# Patient Record
Sex: Male | Born: 1955
Health system: Southern US, Community
[De-identification: ages and names within clinical notes are randomized; demographics above are authoritative.]

## PROBLEM LIST (undated history)

## (undated) DIAGNOSIS — F32A Depression, unspecified: Secondary | ICD-10-CM

## (undated) DIAGNOSIS — F329 Major depressive disorder, single episode, unspecified: Secondary | ICD-10-CM

## (undated) DIAGNOSIS — I214 Non-ST elevation (NSTEMI) myocardial infarction: Secondary | ICD-10-CM

## (undated) DIAGNOSIS — I2510 Atherosclerotic heart disease of native coronary artery without angina pectoris: Secondary | ICD-10-CM

## (undated) DIAGNOSIS — I10 Essential (primary) hypertension: Secondary | ICD-10-CM

## (undated) HISTORY — DX: Major depressive disorder, single episode, unspecified: F32.9

## (undated) HISTORY — DX: Depression, unspecified: F32.A

## (undated) HISTORY — PX: CARDIAC CATHETERIZATION: SHX172

## (undated) HISTORY — PX: BACK SURGERY: SHX140

## (undated) HISTORY — PX: KNEE ARTHROSCOPY: SUR90

## (undated) HISTORY — PX: TONSILLECTOMY: SUR1361

## (undated) HISTORY — DX: Non-ST elevation (NSTEMI) myocardial infarction: I21.4

---
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
## 2000-06-03 ENCOUNTER — Ambulatory Visit (HOSPITAL_BASED_OUTPATIENT_CLINIC_OR_DEPARTMENT_OTHER): Admission: RE | Admit: 2000-06-03 | Discharge: 2000-06-03 | Payer: Self-pay | Admitting: Orthopedic Surgery

## 2006-10-22 ENCOUNTER — Ambulatory Visit: Payer: Self-pay

## 2011-09-18 ENCOUNTER — Ambulatory Visit (INDEPENDENT_AMBULATORY_CARE_PROVIDER_SITE_OTHER): Payer: 59

## 2011-09-18 DIAGNOSIS — G4700 Insomnia, unspecified: Secondary | ICD-10-CM

## 2011-09-26 ENCOUNTER — Encounter (INDEPENDENT_AMBULATORY_CARE_PROVIDER_SITE_OTHER): Payer: 59 | Admitting: Internal Medicine

## 2011-09-26 DIAGNOSIS — Z0000 Encounter for general adult medical examination without abnormal findings: Secondary | ICD-10-CM

## 2011-09-26 DIAGNOSIS — Z23 Encounter for immunization: Secondary | ICD-10-CM

## 2011-11-20 ENCOUNTER — Ambulatory Visit (INDEPENDENT_AMBULATORY_CARE_PROVIDER_SITE_OTHER): Payer: 59 | Admitting: Family Medicine

## 2011-11-20 VITALS — BP 156/75 | HR 79 | Temp 97.7°F | Resp 16 | Ht 70.0 in | Wt 221.0 lb

## 2011-11-20 DIAGNOSIS — R6889 Other general symptoms and signs: Secondary | ICD-10-CM

## 2011-11-20 DIAGNOSIS — R059 Cough, unspecified: Secondary | ICD-10-CM

## 2011-11-20 DIAGNOSIS — R05 Cough: Secondary | ICD-10-CM

## 2011-11-20 DIAGNOSIS — J111 Influenza due to unidentified influenza virus with other respiratory manifestations: Secondary | ICD-10-CM

## 2011-11-20 LAB — POCT INFLUENZA A/B
Influenza A, POC: NEGATIVE
Influenza B, POC: NEGATIVE

## 2011-11-20 MED ORDER — AZITHROMYCIN 250 MG PO TABS: ORAL_TABLET | ORAL | Status: AC

## 2011-11-20 MED ORDER — HYDROCODONE-HOMATROPINE 5-1.5 MG/5ML PO SYRP: 5.0000 mL | ORAL_SOLUTION | Freq: Three times a day (TID) | ORAL | Status: AC | PRN

## 2011-11-20 MED ORDER — BENZONATATE 200 MG PO CAPS: 200.0000 mg | ORAL_CAPSULE | Freq: Two times a day (BID) | ORAL | Status: AC | PRN

## 2011-11-20 MED ORDER — FLUTICASONE PROPIONATE 50 MCG/ACT NA SUSP: 2.0000 | Freq: Every day | NASAL | Status: AC

## 2011-11-20 NOTE — Progress Notes (Signed)
[facility]:  Office Visit  Chief Complaint:  Chief Complaint  Patient presents with  . Sore Throat    last friday  . Cough  . Chills    HPI: Shane Kerr is a 56 y.o. male who complains of  3 day h/o chills, cough (clear), sinus congestion, msk aches. No other complaints. Tried Mucinex, dayquil, nyquil.   Past Medical History  Diagnosis Date  . Depression    History reviewed. No pertinent past surgical history. History   Social History  . Marital Status: Married    Spouse Name: N/A    Number of Children: N/A  . Years of Education: N/A   Social History Main Topics  . Smoking status: Never Smoker   . Smokeless tobacco: None  . Alcohol Use: No  . Drug Use: No  . Sexually Active: None   Other Topics Concern  . None   Social History Narrative  . None   No family history on file. Allergies  Allergen Reactions  . Sulfa Antibiotics Nausea And Vomiting   Prior to Admission medications   Medication Sig Start Date End Date Taking? Authorizing Provider  sertraline (ZOLOFT) 100 MG tablet Take 100 mg by mouth daily.   Yes Historical Provider, MD     ROS: The patient denies fevers, night sweats, unintentional weight loss, chest pain, palpitations, wheezing, dyspnea on exertion, nausea, vomiting, abdominal pain, dysuria, hematuria, melena, numbness, weakness, or tingling. + chills, cough, msk aches  All other systems have been reviewed and were otherwise negative with the exception of those mentioned in the HPI and as above.    PHYSICAL EXAM: Filed Vitals:   11/20/11 1010  BP: 156/75  Pulse: 79  Temp: 97.7 F (36.5 C)  Resp: 16   Filed Vitals:   11/20/11 1010  Height: 5\' 10"  (1.778 m)  Weight: 221 lb (100.245 kg)   Body mass index is 31.71 kg/(m^2).  General: Alert, no acute distress HEENT:  Normocephalic, atraumatic, oropharynx patent. TM nl, + sinus tenderness max bilaterally, NO exudates, boggy erythematous  nares Cardiovascular:  Regular rate and rhythm, no rubs murmurs or gallops.  No Carotid bruits, radial pulse intact. No pedal edema.  Respiratory: Clear to auscultation bilaterally.  No wheezes, rales, or rhonchi.  No cyanosis, no use of accessory musculature GI: No organomegaly, abdomen is soft and non-tender, positive bowel sounds.  No masses. Skin: No rashes. Neurologic: Facial musculature symmetric. Psychiatric: Patient is appropriate throughout our interaction. Lymphatic: No cervical lymphadenopathy Musculoskeletal: Gait intact.   LABS: Results for orders placed in visit on 11/20/11  POCT INFLUENZA A/B      Component Value Range   Influenza A, POC Negative     Influenza B, POC Negative       EKG/XRAY:   Primary read interpreted by Dr. Conley Rolls at [facility].   ASSESSMENT/PLAN: Encounter Diagnoses  Name Primary?  . Cough Yes  . Flu-like symptoms    Sinusitis Rx Tessalon Perles, Hydromet, use Z pack if sxs txt does not work in 4-5 days Rx Flonase     Reagann Dolce PHUONG, DO 11/20/2011 11:00 AM

## 2012-12-09 ENCOUNTER — Ambulatory Visit (INDEPENDENT_AMBULATORY_CARE_PROVIDER_SITE_OTHER): Payer: Managed Care, Other (non HMO) | Admitting: Internal Medicine

## 2012-12-09 ENCOUNTER — Other Ambulatory Visit: Payer: Self-pay | Admitting: Internal Medicine

## 2012-12-09 VITALS — BP 127/74 | HR 44 | Temp 98.2°F | Resp 18 | Ht 71.0 in | Wt 221.0 lb

## 2012-12-09 DIAGNOSIS — Z13220 Encounter for screening for lipoid disorders: Secondary | ICD-10-CM

## 2012-12-09 DIAGNOSIS — R3915 Urgency of urination: Secondary | ICD-10-CM

## 2012-12-09 DIAGNOSIS — Z125 Encounter for screening for malignant neoplasm of prostate: Secondary | ICD-10-CM

## 2012-12-09 DIAGNOSIS — Z1211 Encounter for screening for malignant neoplasm of colon: Secondary | ICD-10-CM

## 2012-12-09 LAB — POCT UA - MICROSCOPIC ONLY
Bacteria, U Microscopic: NEGATIVE
Casts, Ur, LPF, POC: NEGATIVE
Crystals, Ur, HPF, POC: NEGATIVE
WBC, Ur, HPF, POC: NEGATIVE
Yeast, UA: NEGATIVE

## 2012-12-09 LAB — POCT CBC
Granulocyte percent: 61.8 %G (ref 37–80)
HCT, POC: 50.7 % (ref 43.5–53.7)
Hemoglobin: 16.6 g/dL (ref 14.1–18.1)
Lymph, poc: 3.3 (ref 0.6–3.4)
MCH, POC: 30.9 pg (ref 27–31.2)
MCHC: 32.7 g/dL (ref 31.8–35.4)
MCV: 94.2 fL (ref 80–97)
MID (cbc): 0.7 (ref 0–0.9)
MPV: 8.2 fL (ref 0–99.8)
POC Granulocyte: 6.4 (ref 2–6.9)
POC LYMPH PERCENT: 31.3 %L (ref 10–50)
POC MID %: 6.9 %M (ref 0–12)
Platelet Count, POC: 264 10*3/uL (ref 142–424)
RBC: 5.38 M/uL (ref 4.69–6.13)
RDW, POC: 14.2 %
WBC: 10.4 10*3/uL — AB (ref 4.6–10.2)

## 2012-12-09 LAB — POCT URINALYSIS DIPSTICK
Bilirubin, UA: NEGATIVE
Blood, UA: NEGATIVE
Glucose, UA: NEGATIVE
Leukocytes, UA: NEGATIVE
Nitrite, UA: NEGATIVE
Protein, UA: NEGATIVE
Spec Grav, UA: 1.025
Urobilinogen, UA: 0.2
pH, UA: 7

## 2012-12-09 LAB — IFOBT (OCCULT BLOOD): IFOBT: NEGATIVE

## 2012-12-09 NOTE — Progress Notes (Signed)
Subjective:    Patient ID: Shane Kerr, male    DOB: [birth year], 57 y.o.   MRN: [id number]  HPIurinary urgency for 1-2 months No dysuria, frequency, nocturia, dribbling, hesitation, early cutoff One episode of nocturnal incontinence Father hx prostate Ca Stopped Zoloft-"sleep" better//much better overall especially with New job city of [employer]  Reviewed old paper chart//no routine labs for a few years  Review of Systems No fever chills night sweats   no weight loss No abdominal pain No back pain Objective:   Physical Exam BP 127/74  Pulse 44  Temp(Src) 98.2 F (36.8 C) (Oral)  Resp 18  Ht 5\' 11"  (1.803 m)  Wt 221 lb (100.245 kg)  BMI 30.84 kg/m2  SpO2 96% HEENT clear Heart regular without murmur Lungs clear Abdomen supple Prostate soft and symmetrical and not enlarged Extremities clear       Assessment & Plan:   Problem List Items Addressed This Visit   None    Visit Diagnoses   Urinary urgency    -  Primary    Relevant Orders       Comprehensive metabolic panel       PSA       TSH    Screening for hyperlipidemia        Relevant Orders       Lipid panel    Special screening for malignant neoplasms, colon        Screening for prostate cancer          Results for orders placed in visit on 12/09/12  COMPREHENSIVE METABOLIC PANEL      Result Value Range   Sodium 138  135 - 145 mEq/L   Potassium 4.4  3.5 - 5.3 mEq/L   Chloride 103  96 - 112 mEq/L   CO2 27  19 - 32 mEq/L   Glucose, Bld 86  70 - 99 mg/dL   BUN 18  6 - 23 mg/dL   Creat 4.09  8.11 - 9.14 mg/dL   Total Bilirubin 0.5  0.3 - 1.2 mg/dL   Alkaline Phosphatase 50  39 - 117 U/L   AST 19  0 - 37 U/L   ALT 20  0 - 53 U/L   Total Protein 7.3  6.0 - 8.3 g/dL   Albumin 4.7  3.5 - 5.2 g/dL   Calcium 9.7  8.4 - 78.2 mg/dL  LIPID PANEL      Result Value Range   Cholesterol 153  0 - 200 mg/dL   Triglycerides 73  <956 mg/dL   HDL 51  >21 mg/dL   Total CHOL/HDL Ratio 3.0     VLDL 15  0 -  40 mg/dL   LDL Cholesterol 87  0 - 99 mg/dL  PSA      Result Value Range   PSA 1.26  <=4.00 ng/mL  TSH      Result Value Range   TSH 4.939 (*) 0.350 - 4.500 uIU/mL  POCT URINALYSIS DIPSTICK      Result Value Range   Color, UA yellow     Clarity, UA hazy     Glucose, UA neg     Bilirubin, UA neg     Ketones, UA trace     Spec Grav, UA 1.025     Blood, UA neg     pH, UA 7.0     Protein, UA neg     Urobilinogen, UA 0.2     Nitrite, UA neg  Leukocytes, UA Negative    POCT UA - MICROSCOPIC ONLY      Result Value Range   WBC, Ur, HPF, POC neg     RBC, urine, microscopic 0-2     Bacteria, U Microscopic neg     Mucus, UA small     Epithelial cells, urine per micros 0-1     Crystals, Ur, HPF, POC neg     Casts, Ur, LPF, POC neg     Yeast, UA neg     Amorphous moderate    POCT CBC      Result Value Range   WBC 10.4 (*) 4.6 - 10.2 K/uL   Lymph, poc 3.3  0.6 - 3.4   POC LYMPH PERCENT 31.3  10 - 50 %L   MID (cbc) 0.7  0 - 0.9   POC MID % 6.9  0 - 12 %M   POC Granulocyte 6.4  2 - 6.9   Granulocyte percent 61.8  37 - 80 %G   RBC 5.38  4.69 - 6.13 M/uL   Hemoglobin 16.6  14.1 - 18.1 g/dL   HCT, POC 16.1  09.6 - 53.7 %   MCV 94.2  80 - 97 fL   MCH, POC 30.9  27 - 31.2 pg   MCHC 32.7  31.8 - 35.4 g/dL   RDW, POC 04.5     Platelet Count, POC 264  142 - 424 K/uL   MPV 8.2  0 - 99.8 fL  IFOBT (OCCULT BLOOD)      Result Value Range   IFOBT Negative     Will add free t4 ?urol consult

## 2012-12-10 LAB — LIPID PANEL
Cholesterol: 153 mg/dL (ref 0–200)
HDL: 51 mg/dL (ref >39)
LDL Cholesterol: 87 mg/dL (ref 0–99)
Total CHOL/HDL Ratio: 3 Ratio
Triglycerides: 73 mg/dL (ref <150)
VLDL: 15 mg/dL (ref 0–40)

## 2012-12-10 LAB — COMPREHENSIVE METABOLIC PANEL
ALT: 20 U/L (ref 0–53)
AST: 19 U/L (ref 0–37)
Albumin: 4.7 g/dL (ref 3.5–5.2)
Alkaline Phosphatase: 50 U/L (ref 39–117)
BUN: 18 mg/dL (ref 6–23)
CO2: 27 mEq/L (ref 19–32)
Calcium: 9.7 mg/dL (ref 8.4–10.5)
Chloride: 103 mEq/L (ref 96–112)
Creat: 1.23 mg/dL (ref 0.50–1.35)
Glucose, Bld: 86 mg/dL (ref 70–99)
Potassium: 4.4 mEq/L (ref 3.5–5.3)
Sodium: 138 mEq/L (ref 135–145)
Total Bilirubin: 0.5 mg/dL (ref 0.3–1.2)
Total Protein: 7.3 g/dL (ref 6.0–8.3)

## 2012-12-10 LAB — PSA: PSA: 1.26 ng/mL (ref <=4.00)

## 2012-12-10 LAB — TSH: TSH: 4.939 u[IU]/mL — ABNORMAL HIGH (ref 0.350–4.500)

## 2012-12-11 LAB — T4, FREE: Free T4: 1.2 ng/dL (ref 0.80–1.80)

## 2012-12-15 ENCOUNTER — Encounter: Payer: Self-pay | Admitting: Internal Medicine

## 2012-12-17 ENCOUNTER — Telehealth: Payer: Self-pay

## 2012-12-17 NOTE — Telephone Encounter (Signed)
Labs mailed to patient. They look great. Patient advised.

## 2012-12-17 NOTE — Telephone Encounter (Signed)
Pt is calling about his lab results if someone could give him a call back at [phone]

## 2013-05-11 ENCOUNTER — Ambulatory Visit (INDEPENDENT_AMBULATORY_CARE_PROVIDER_SITE_OTHER): Payer: Managed Care, Other (non HMO) | Admitting: Internal Medicine

## 2013-05-11 VITALS — BP 120/78 | HR 76 | Temp 97.9°F | Resp 18 | Ht 70.0 in | Wt 221.2 lb

## 2013-05-11 DIAGNOSIS — G4700 Insomnia, unspecified: Secondary | ICD-10-CM

## 2013-05-11 DIAGNOSIS — F4323 Adjustment disorder with mixed anxiety and depressed mood: Secondary | ICD-10-CM

## 2013-05-11 MED ORDER — SERTRALINE HCL 100 MG PO TABS: ORAL_TABLET | ORAL | Status: DC

## 2013-05-12 NOTE — Progress Notes (Signed)
  Subjective:    Patient ID: Shane Kerr, male    DOB: 1956-08-01, 57 y.o.   MRN: [id number]  HPIsee long hx Weaned zoloft and did well til job change with new supervisor Now stressed with anxiety and some dysthymia occas sleep issues if worrying May need to chnge again  Review of Systems     Objective:   Physical Exam BP 120/78  Pulse 76  Temp(Src) 97.9 F (36.6 C) (Oral)  Resp 18  Ht 5\' 10"  (1.778 m)  Wt 221 lb 3.2 oz (100.336 kg)  BMI 31.74 kg/m2  SpO2 98% Mood stable/thoughts good       Assessment & Plan:  Reactive mood changes with D/A/I  Restart Zoloft 50---? Advance to 100 if no response He has xanax from 2 yrs ago--very occas prn-call if needs more 3 mos-41mos  Meds ordered this encounter  Medications  . sertraline (ZOLOFT) 100 MG tablet    Sig: 1/2 to 1 tab as directed    Dispense:  30 tablet    Refill:  3

## 2013-09-09 ENCOUNTER — Encounter: Payer: Self-pay | Admitting: Podiatry

## 2013-09-09 ENCOUNTER — Ambulatory Visit (INDEPENDENT_AMBULATORY_CARE_PROVIDER_SITE_OTHER): Payer: Managed Care, Other (non HMO) | Admitting: Podiatry

## 2013-09-09 VITALS — BP 129/73 | HR 77 | Resp 12 | Ht 71.0 in | Wt 225.0 lb

## 2013-09-09 DIAGNOSIS — L03039 Cellulitis of unspecified toe: Secondary | ICD-10-CM

## 2013-09-09 NOTE — Progress Notes (Signed)
   Subjective:    Patient ID: Shane Kerr, male    DOB: 1956-06-18, 58 y.o.   MRN: [id number]  HPI Comments: '' LT FOOT 3RD TOENAIL START BLEEDING AND SORE THIS MORNING. ALSO, THE REST OF THE TOENAILS HAVE DISCOLORATION AND  TRIED NO TREATMENT.''  He thinks that he may have injured the nail last night. And also has some concern about the multiple deformed toenails x10 and right and left feet. He recalls some use of oral medication in the past with some improvement the    Review of Systems  All other systems reviewed and are negative.       Objective:   Physical Exam : Subjective: Orientated x153 58 year old white male  Vascular: The DP and PT pulses are two over four bilaterally.  Neurological: Deferred  Dermatological: 10 yellow, brittle, deformed toenails noted. The third left toenail is partially attached at the posterior nail fold it has a small amount of bleeding and is very tender to palpation.  Musculoskeletal no deformities noted bilaterally       Assessment & Plan:   Assessment: Partial traumatic nail avulsion third toe left foot. Paronychia third left toe secondary to trauma. Onychomycoses x10  Plan: Offered patient and avulsion of the third left toenail which he accepted. We also had a discussion about permanent nail removal to the third left toenail which he declined.  The third left toe was blocked with 2 cc of 50-50 mixture of 2% plain Xylocaine and 0.5% plain Marcaine. The third left toe was prepped with Betadine and exsanguinated. The third left nail was avulsed demonstrating approximately 90% detachment from the underlying nailbed. An antibiotic dressing was applied. The tourniquet was released and spontaneous Fill was noted to the third digit on the left foot. Patient will use over-the-counter NSAID if needed for pain control and soaked in dilute it antibacterial soft soaks.  We had a discussion about treatment options for nail fungal infections  including no treatment topical oral medication or permanent nail removal. Patient will consider treatment options same contacts he wishes any followup care for onychomycoses.

## 2013-09-09 NOTE — Patient Instructions (Signed)
ANTIBACTERIAL SOAP INSTRUCTIONS  THE DAY AFTER PROCEDURE  Please follow the instructions your doctor has marked.   Shower as usual. Before getting out, place a drop of antibacterial liquid soap (Dial) on a wet, clean washcloth.  Gently wipe washcloth over affected area.  Afterward, rinse the area with warm water.  Blot the area dry with a soft cloth and cover with antibiotic ointment (neosporin, polysporin, bacitracin) and band aid or gauze and tape  Place 3-4 drops of antibacterial liquid soap in a quart of warm tap water.  Submerge foot into water for 20 minutes.  If bandage was applied after your procedure, leave on to allow for easy lift off, then remove and continue with soak for the remaining time.  Next, blot area dry with a soft cloth and cover with a bandage.  Apply other medications as directed by your doctor, such as cortisporin otic solution (eardrops) or neosporin antibiotic ointment  For mild pain use over-the-counter medication as ibuprofen or Aleve.

## 2014-01-18 ENCOUNTER — Other Ambulatory Visit: Payer: Self-pay | Admitting: Internal Medicine

## 2014-01-21 ENCOUNTER — Other Ambulatory Visit: Payer: Self-pay | Admitting: Internal Medicine

## 2014-03-02 ENCOUNTER — Other Ambulatory Visit: Payer: Self-pay | Admitting: Internal Medicine

## 2014-05-02 ENCOUNTER — Ambulatory Visit (INDEPENDENT_AMBULATORY_CARE_PROVIDER_SITE_OTHER): Payer: Managed Care, Other (non HMO) | Admitting: Internal Medicine

## 2014-05-02 VITALS — BP 108/62 | HR 80 | Temp 98.0°F | Resp 15 | Ht 71.0 in | Wt 222.0 lb

## 2014-05-02 DIAGNOSIS — G4700 Insomnia, unspecified: Secondary | ICD-10-CM

## 2014-05-02 MED ORDER — SERTRALINE HCL 100 MG PO TABS: ORAL_TABLET | ORAL | Status: DC

## 2014-05-02 NOTE — Progress Notes (Signed)
F/u Reactive depr-workplace stress Still in very stressful enviro Trying to wean medication over the past year because he has a concern that he is being refused long-term insurance because of the history that shows up in his medical records of being on this medicine He works in [employer] and is aware of all the availability of data that is less than secure  His health is stable otherwise Next labs and physical at age 58  Review of systems negative today Exam BP 108/62  Pulse 80  Temp(Src) 98 F (36.7 C) (Oral)  Resp 15  Ht  (1.803 m)  Wt 222 lb (100.699 kg)  BMI 30.98 kg/m2  SpO2 97% HEENT negative Heart regular Extremities clear Mood stable  Imp Reactive depression  Meds ordered this encounter  Medications  . sertraline (ZOLOFT) 100 MG tablet    Sig: Take 1/2 to 1 tablet by mouth daily.    Dispense:  90 tablet    Refill:  3   He will observe this symptom complex w/ vaiable Doses over 6-8 week period to make a decision about weaning We discussed the options of cognitive behavioral therapy F/u 2016

## 2014-05-17 ENCOUNTER — Ambulatory Visit (INDEPENDENT_AMBULATORY_CARE_PROVIDER_SITE_OTHER): Payer: Managed Care, Other (non HMO) | Admitting: Internal Medicine

## 2014-05-17 VITALS — BP 118/80 | HR 88 | Temp 97.8°F | Resp 16 | Ht 71.0 in | Wt 222.2 lb

## 2014-05-17 DIAGNOSIS — J0100 Acute maxillary sinusitis, unspecified: Secondary | ICD-10-CM

## 2014-05-17 MED ORDER — AMOXICILLIN 875 MG PO TABS
875.0000 mg | ORAL_TABLET | Freq: Two times a day (BID) | ORAL | Status: DC
Start: 2014-05-17 — End: 2014-08-12

## 2014-05-17 NOTE — Progress Notes (Signed)
   Subjective:   This chart was scribed for [name], MD by Arlan Organ, [facility] Scribe. This patient was seen in room 10 and the patient's care was started 4:16 PM.    Patient ID: Shane Kerr, male    DOB: 23-Jul-1956, 58 y.o.   MRN: [id number]  Chief Complaint  Patient presents with  . Sinus Problem    Possible sinus infection, nasal congestion, coughing, x1wk     HPI  HPI Comments: [name] is a 58 y.o. male with a PMHx of depression who presents to [facility] complaining of possible sinusitis x 1 week. Pt reports constant, moderate nasal congestion, sinus pressure, and mild cough. States symptoms are worsened in the morning after waking from sleep. Pt attributes symptoms to swimming in a cool pool over labor day weekend. No fever or chills.   There are no active problems to display for this patient.  Past Medical History  Diagnosis Date  . Depression    History reviewed. No pertinent past surgical history. Allergies  Allergen Reactions  . Sulfa Antibiotics Nausea And Vomiting   Prior to Admission medications   Medication Sig Start Date End Date Taking? Authorizing Provider  sertraline (ZOLOFT) 100 MG tablet Take 1/2 to 1 tablet by mouth daily. 05/02/14  Yes Tonye Pearson, MD    Review of Systems  Constitutional: Negative for fever and chills.  HENT: Positive for congestion and sinus pressure.   Respiratory: Positive for cough.     Triage Vitals: BP 118/80  Pulse 88  Temp(Src) 97.8 F (36.6 C) (Oral)  Resp 16  Ht  (1.803 m)  Wt 222 lb 3.2 oz (100.789 kg)  BMI 31.00 kg/m2  SpO2 99%   Objective:  Physical Exam  HENT:  Right Ear: Tympanic membrane normal.  Left Ear: Tympanic membrane normal.  Mouth/Throat: Oropharynx is clear and moist.  Nares have purulent discharge Tender maxillary areas  Eyes: Conjunctivae are normal.  Pulmonary/Chest: Breath sounds normal.  Lymphadenopathy:    He  has no cervical adenopathy.    Assessment & Plan:   I personally performed the services described in this documentation, which was scribed in my presence. The recorded information has been reviewed and is accurate.   Acute maxillary sinusitis, recurrence not specified  Meds ordered this encounter  Medications  . amoxicillin (AMOXIL) 875 MG tablet    Sig: Take 1 tablet (875 mg total) by mouth 2 (two) times daily.    Dispense:  20 tablet    Refill:  0   otc decon

## 2014-06-03 HISTORY — PX: TRIGGER FINGER RELEASE: SHX641

## 2014-08-12 ENCOUNTER — Ambulatory Visit (INDEPENDENT_AMBULATORY_CARE_PROVIDER_SITE_OTHER): Payer: Managed Care, Other (non HMO) | Admitting: Physician Assistant

## 2014-08-12 VITALS — BP 138/80 | HR 77 | Temp 98.4°F | Resp 18 | Ht 71.0 in | Wt 230.0 lb

## 2014-08-12 DIAGNOSIS — M62830 Muscle spasm of back: Secondary | ICD-10-CM

## 2014-08-12 MED ORDER — MELOXICAM 7.5 MG PO TABS: 7.5000 mg | ORAL_TABLET | Freq: Every day | ORAL | Status: DC

## 2014-08-12 MED ORDER — CYCLOBENZAPRINE HCL 10 MG PO TABS: 10.0000 mg | ORAL_TABLET | Freq: Three times a day (TID) | ORAL | Status: DC | PRN

## 2014-08-12 NOTE — Patient Instructions (Signed)
You have some spasm along your lumbar back muscles which I think is causing the low back and glute pain. Please take the flexeril every 8 hours as needed for the spasm. Please take the mobic once daily for the next 14 days for inflammation. Do not take any other NSAIDs (ibuprofen, aleve, etc) with this. Try to rest your back. Apply heat and ice, whichever feels better. Once your are feeling better try to do some light range of motion exercises and stretching exercises.   Low Back Strain with Rehab A strain is an injury in which a tendon or muscle is torn. The muscles and tendons of the lower back are vulnerable to strains. However, these muscles and tendons are very strong and require a great force to be injured. Strains are classified into three categories. Grade 1 strains cause pain, but the tendon is not lengthened. Grade 2 strains include a lengthened ligament, due to the ligament being stretched or partially ruptured. With grade 2 strains there is still function, although the function may be decreased. Grade 3 strains involve a complete tear of the tendon or muscle, and function is usually impaired. SYMPTOMS   Pain in the lower back.  Pain that affects one side more than the other.  Pain that gets worse with movement and may be felt in the hip, buttocks, or back of the thigh.  Muscle spasms of the muscles in the back.  Swelling along the muscles of the back.  Loss of strength of the back muscles.  Crackling sound (crepitation) when the muscles are touched. CAUSES  Lower back strains occur when a force is placed on the muscles or tendons that is greater than they can handle. Common causes of injury include:  Prolonged overuse of the muscle-tendon units in the lower back, usually from incorrect posture.  A single violent injury or force applied to the back. RISK INCREASES WITH:  Sports that involve twisting forces on the spine or a lot of bending at the waist (football, rugby,  weightlifting, bowling, golf, tennis, speed skating, racquetball, swimming, running, gymnastics, diving).  Poor strength and flexibility.  Failure to warm up properly before activity.  Family history of lower back pain or disk disorders.  Previous back injury or surgery (especially fusion).  Poor posture with lifting, especially heavy objects.  Prolonged sitting, especially with poor posture. PREVENTION   Learn and use proper posture when sitting or lifting (maintain proper posture when sitting, lift using the knees and legs, not at the waist).  Warm up and stretch properly before activity.  Allow for adequate recovery between workouts.  Maintain physical fitness:  Strength, flexibility, and endurance.  Cardiovascular fitness. PROGNOSIS  If treated properly, lower back strains usually heal within 6 weeks. RELATED COMPLICATIONS   Recurring symptoms, resulting in a chronic problem.  Chronic inflammation, scarring, and partial muscle-tendon tear.  Delayed healing or resolution of symptoms.  Prolonged disability. TREATMENT  Treatment first involves the use of ice and medicine, to reduce pain and inflammation. The use of strengthening and stretching exercises may help reduce pain with activity. These exercises may be performed at home or with a therapist. Severe injuries may require referral to a therapist for further evaluation and treatment, such as ultrasound. Your caregiver may advise that you wear a back brace or corset, to help reduce pain and discomfort. Often, prolonged bed rest results in greater harm then benefit. Corticosteroid injections may be recommended. However, these should be reserved for the most serious cases. It is  important to avoid using your back when lifting objects. At night, sleep on your back on a firm mattress with a pillow placed under your knees. If non-surgical treatment is unsuccessful, surgery may be needed.  MEDICATION   If pain medicine is  needed, nonsteroidal anti-inflammatory medicines (aspirin and ibuprofen), or other minor pain relievers (acetaminophen), are often advised.  Do not take pain medicine for 7 days before surgery.  Prescription pain relievers may be given, if your caregiver thinks they are needed. Use only as directed and only as much as you need.  Ointments applied to the skin may be helpful.  Corticosteroid injections may be given by your caregiver. These injections should be reserved for the most serious cases, because they may only be given a certain number of times. HEAT AND COLD  Cold treatment (icing) should be applied for 10 to 15 minutes every 2 to 3 hours for inflammation and pain, and immediately after activity that aggravates your symptoms. Use ice packs or an ice massage.  Heat treatment may be used before performing stretching and strengthening activities prescribed by your caregiver, physical therapist, or athletic trainer. Use a heat pack or a warm water soak. SEEK MEDICAL CARE IF:   Symptoms get worse or do not improve in 2 to 4 weeks, despite treatment.  You develop numbness, weakness, or loss of bowel or bladder function.  New, unexplained symptoms develop. (Drugs used in treatment may produce side effects.) EXERCISES  RANGE OF MOTION (ROM) AND STRETCHING EXERCISES - Low Back Strain Most people with lower back pain will find that their symptoms get worse with excessive bending forward (flexion) or arching at the lower back (extension). The exercises which will help resolve your symptoms will focus on the opposite motion.  Your physician, physical therapist or athletic trainer will help you determine which exercises will be most helpful to resolve your lower back pain. Do not complete any exercises without first consulting with your caregiver. Discontinue any exercises which make your symptoms worse until you speak to your caregiver.  If you have pain, numbness or tingling which travels down  into your buttocks, leg or foot, the goal of the therapy is for these symptoms to move closer to your back and eventually resolve. Sometimes, these leg symptoms will get better, but your lower back pain may worsen. This is typically an indication of progress in your rehabilitation. Be very alert to any changes in your symptoms and the activities in which you participated in the 24 hours prior to the change. Sharing this information with your caregiver will allow him/her to most efficiently treat your condition.  These exercises may help you when beginning to rehabilitate your injury. Your symptoms may resolve with or without further involvement from your physician, physical therapist or athletic trainer. While completing these exercises, remember:  Restoring tissue flexibility helps normal motion to return to the joints. This allows healthier, less painful movement and activity.  An effective stretch should be held for at least 30 seconds.  A stretch should never be painful. You should only feel a gentle lengthening or release in the stretched tissue. FLEXION RANGE OF MOTION AND STRETCHING EXERCISES: STRETCH - Flexion, Single Knee to Chest   Lie on a firm bed or floor with both legs extended in front of you.  Keeping one leg in contact with the floor, bring your opposite knee to your chest. Hold your leg in place by either grabbing behind your thigh or at your knee.  Pull until  you feel a gentle stretch in your lower back. Hold __________ seconds.  Slowly release your grasp and repeat the exercise with the opposite side. Repeat __________ times. Complete this exercise __________ times per day.  STRETCH - Flexion, Double Knee to Chest   Lie on a firm bed or floor with both legs extended in front of you.  Keeping one leg in contact with the floor, bring your opposite knee to your chest.  Tense your stomach muscles to support your back and then lift your other knee to your chest. Hold your  legs in place by either grabbing behind your thighs or at your knees.  Pull both knees toward your chest until you feel a gentle stretch in your lower back. Hold __________ seconds.  Tense your stomach muscles and slowly return one leg at a time to the floor. Repeat __________ times. Complete this exercise __________ times per day.  STRETCH - Low Trunk Rotation  Lie on a firm bed or floor. Keeping your legs in front of you, bend your knees so they are both pointed toward the ceiling and your feet are flat on the floor.  Extend your arms out to the side. This will stabilize your upper body by keeping your shoulders in contact with the floor.  Gently and slowly drop both knees together to one side until you feel a gentle stretch in your lower back. Hold for __________ seconds.  Tense your stomach muscles to support your lower back as you bring your knees back to the starting position. Repeat the exercise to the other side. Repeat __________ times. Complete this exercise __________ times per day  EXTENSION RANGE OF MOTION AND FLEXIBILITY EXERCISES: STRETCH - Extension, Prone on Elbows   Lie on your stomach on the floor, a bed will be too soft. Place your palms about shoulder width apart and at the height of your head.  Place your elbows under your shoulders. If this is too painful, stack pillows under your chest.  Allow your body to relax so that your hips drop lower and make contact more completely with the floor.  Hold this position for __________ seconds.  Slowly return to lying flat on the floor. Repeat __________ times. Complete this exercise __________ times per day.  RANGE OF MOTION - Extension, Prone Press Ups  Lie on your stomach on the floor, a bed will be too soft. Place your palms about shoulder width apart and at the height of your head.  Keeping your back as relaxed as possible, slowly straighten your elbows while keeping your hips on the floor. You may adjust the  placement of your hands to maximize your comfort. As you gain motion, your hands will come more underneath your shoulders.  Hold this position __________ seconds.  Slowly return to lying flat on the floor. Repeat __________ times. Complete this exercise __________ times per day.  RANGE OF MOTION- Quadruped, Neutral Spine   Assume a hands and knees position on a firm surface. Keep your hands under your shoulders and your knees under your hips. You may place padding under your knees for comfort.  Drop your head and point your tail bone toward the ground below you. This will round out your lower back like an angry cat. Hold this position for __________ seconds.  Slowly lift your head and release your tail bone so that your back sags into a large arch, like an old horse.  Hold this position for __________ seconds.  Repeat this until you feel  limber in your lower back.  Now, find your "sweet spot." This will be the most comfortable position somewhere between the two previous positions. This is your neutral spine. Once you have found this position, tense your stomach muscles to support your lower back.  Hold this position for __________ seconds. Repeat __________ times. Complete this exercise __________ times per day.  STRENGTHENING EXERCISES - Low Back Strain These exercises may help you when beginning to rehabilitate your injury. These exercises should be done near your "sweet spot." This is the neutral, low-back arch, somewhere between fully rounded and fully arched, that is your least painful position. When performed in this safe range of motion, these exercises can be used for people who have either a flexion or extension based injury. These exercises may resolve your symptoms with or without further involvement from your physician, physical therapist or athletic trainer. While completing these exercises, remember:   Muscles can gain both the endurance and the strength needed for everyday  activities through controlled exercises.  Complete these exercises as instructed by your physician, physical therapist or athletic trainer. Increase the resistance and repetitions only as guided.  You may experience muscle soreness or fatigue, but the pain or discomfort you are trying to eliminate should never worsen during these exercises. If this pain does worsen, stop and make certain you are following the directions exactly. If the pain is still present after adjustments, discontinue the exercise until you can discuss the trouble with your caregiver. STRENGTHENING - Deep Abdominals, Pelvic Tilt  Lie on a firm bed or floor. Keeping your legs in front of you, bend your knees so they are both pointed toward the ceiling and your feet are flat on the floor.  Tense your lower abdominal muscles to press your lower back into the floor. This motion will rotate your pelvis so that your tail bone is scooping upwards rather than pointing at your feet or into the floor.  With a gentle tension and even breathing, hold this position for __________ seconds. Repeat __________ times. Complete this exercise __________ times per day.  STRENGTHENING - Abdominals, Crunches   Lie on a firm bed or floor. Keeping your legs in front of you, bend your knees so they are both pointed toward the ceiling and your feet are flat on the floor. Cross your arms over your chest.  Slightly tip your chin down without bending your neck.  Tense your abdominals and slowly lift your trunk high enough to just clear your shoulder blades. Lifting higher can put excessive stress on the lower back and does not further strengthen your abdominal muscles.  Control your return to the starting position. Repeat __________ times. Complete this exercise __________ times per day.  STRENGTHENING - Quadruped, Opposite UE/LE Lift   Assume a hands and knees position on a firm surface. Keep your hands under your shoulders and your knees under your  hips. You may place padding under your knees for comfort.  Find your neutral spine and gently tense your abdominal muscles so that you can maintain this position. Your shoulders and hips should form a rectangle that is parallel with the floor and is not twisted.  Keeping your trunk steady, lift your right hand no higher than your shoulder and then your left leg no higher than your hip. Make sure you are not holding your breath. Hold this position __________ seconds.  Continuing to keep your abdominal muscles tense and your back steady, slowly return to your starting position. Repeat with  the opposite arm and leg. Repeat __________ times. Complete this exercise __________ times per day.  STRENGTHENING - Lower Abdominals, Double Knee Lift  Lie on a firm bed or floor. Keeping your legs in front of you, bend your knees so they are both pointed toward the ceiling and your feet are flat on the floor.  Tense your abdominal muscles to brace your lower back and slowly lift both of your knees until they come over your hips. Be certain not to hold your breath.  Hold __________ seconds. Using your abdominal muscles, return to the starting position in a slow and controlled manner. Repeat __________ times. Complete this exercise __________ times per day.  POSTURE AND BODY MECHANICS CONSIDERATIONS - Low Back Strain Keeping correct posture when sitting, standing or completing your activities will reduce the stress put on different body tissues, allowing injured tissues a chance to heal and limiting painful experiences. The following are general guidelines for improved posture. Your physician or physical therapist will provide you with any instructions specific to your needs. While reading these guidelines, remember:  The exercises prescribed by your provider will help you have the flexibility and strength to maintain correct postures.  The correct posture provides the best environment for your joints to work.  All of your joints have less wear and tear when properly supported by a spine with good posture. This means you will experience a healthier, less painful body.  Correct posture must be practiced with all of your activities, especially prolonged sitting and standing. Correct posture is as important when doing repetitive low-stress activities (typing) as it is when doing a single heavy-load activity (lifting). RESTING POSITIONS Consider which positions are most painful for you when choosing a resting position. If you have pain with flexion-based activities (sitting, bending, stooping, squatting), choose a position that allows you to rest in a less flexed posture. You would want to avoid curling into a fetal position on your side. If your pain worsens with extension-based activities (prolonged standing, working overhead), avoid resting in an extended position such as sleeping on your stomach. Most people will find more comfort when they rest with their spine in a more neutral position, neither too rounded nor too arched. Lying on a non-sagging bed on your side with a pillow between your knees, or on your back with a pillow under your knees will often provide some relief. Keep in mind, being in any one position for a prolonged period of time, no matter how correct your posture, can still lead to stiffness. PROPER SITTING POSTURE In order to minimize stress and discomfort on your spine, you must sit with correct posture. Sitting with good posture should be effortless for a healthy body. Returning to good posture is a gradual process. Many people can work toward this most comfortably by using various supports until they have the flexibility and strength to maintain this posture on their own. When sitting with proper posture, your ears will fall over your shoulders and your shoulders will fall over your hips. You should use the back of the chair to support your upper back. Your lower back will be in a neutral  position, just slightly arched. You may place a small pillow or folded towel at the base of your lower back for support.  When working at a desk, create an environment that supports good, upright posture. Without extra support, muscles tire, which leads to excessive strain on joints and other tissues. Keep these recommendations in mind: CHAIR:  A chair  should be able to slide under your desk when your back makes contact with the back of the chair. This allows you to work closely.  The chair's height should allow your eyes to be level with the upper part of your monitor and your hands to be slightly lower than your elbows. BODY POSITION  Your feet should make contact with the floor. If this is not possible, use a foot rest.  Keep your ears over your shoulders. This will reduce stress on your neck and lower back. INCORRECT SITTING POSTURES  If you are feeling tired and unable to assume a healthy sitting posture, do not slouch or slump. This puts excessive strain on your back tissues, causing more damage and pain. Healthier options include:  Using more support, like a lumbar pillow.  Switching tasks to something that requires you to be upright or walking.  Talking a brief walk.  Lying down to rest in a neutral-spine position. PROLONGED STANDING WHILE SLIGHTLY LEANING FORWARD  When completing a task that requires you to lean forward while standing in one place for a long time, place either foot up on a stationary 2-4 inch high object to help maintain the best posture. When both feet are on the ground, the lower back tends to lose its slight inward curve. If this curve flattens (or becomes too large), then the back and your other joints will experience too much stress, tire more quickly, and can cause pain. CORRECT STANDING POSTURES Proper standing posture should be assumed with all daily activities, even if they only take a few moments, like when brushing your teeth. As in sitting, your ears  should fall over your shoulders and your shoulders should fall over your hips. You should keep a slight tension in your abdominal muscles to brace your spine. Your tailbone should point down to the ground, not behind your body, resulting in an over-extended swayback posture.  INCORRECT STANDING POSTURES  Common incorrect standing postures include a forward head, locked knees and/or an excessive swayback. WALKING Walk with an upright posture. Your ears, shoulders and hips should all line-up. PROLONGED ACTIVITY IN A FLEXED POSITION When completing a task that requires you to bend forward at your waist or lean over a low surface, try to find a way to stabilize 3 out of 4 of your limbs. You can place a hand or elbow on your thigh or rest a knee on the surface you are reaching across. This will provide you more stability so that your muscles do not fatigue as quickly. By keeping your knees relaxed, or slightly bent, you will also reduce stress across your lower back. CORRECT LIFTING TECHNIQUES DO :   Assume a wide stance. This will provide you more stability and the opportunity to get as close as possible to the object which you are lifting.  Tense your abdominals to brace your spine. Bend at the knees and hips. Keeping your back locked in a neutral-spine position, lift using your leg muscles. Lift with your legs, keeping your back straight.  Test the weight of unknown objects before attempting to lift them.  Try to keep your elbows locked down at your sides in order get the best strength from your shoulders when carrying an object.  Always ask for help when lifting heavy or awkward objects. INCORRECT LIFTING TECHNIQUES DO NOT:   Lock your knees when lifting, even if it is a small object.  Bend and twist. Pivot at your feet or move your feet when needing  to change directions.  Assume that you can safely pick up even a paper clip without proper posture. Document Released: 08/20/2005 Document  Revised: 11/12/2011 Document Reviewed: 12/02/2008 [facility] Patient Information 2015 [place], [place]. This information is not intended to replace advice given to you by your health care provider. Make sure you discuss any questions you have with your health care provider.

## 2014-08-12 NOTE — Progress Notes (Signed)
   Subjective:    Patient ID: Shane Kerr, male    DOB: Aug 31, 1956, 58 y.o.   MRN: [id number]  PCP: Tonye PearsonOLITTLE, ROBERT P, MD  Chief Complaint  Patient presents with  . Back Pain    x1 week worse today    There are no active problems to display for this patient.  Prior to Admission medications   Medication Sig Start Date End Date Taking? Authorizing Provider  sertraline (ZOLOFT) 100 MG tablet Take 1/2 to 1 tablet by mouth daily. 05/02/14  Yes Tonye Pearsonobert P Doolittle, MD  cyclobenzaprine (FLEXERIL) 10 MG tablet Take 1 tablet (10 mg total) by mouth 3 (three) times daily as needed for muscle spasms. 08/12/14   Raelyn Ensignodd Tavyn Kurka, PA  HYDROcodone-acetaminophen (NORCO/VICODIN) 5-325 MG per tablet Take by mouth. for pain 06/11/14   Historical Provider, MD  meloxicam (MOBIC) 7.5 MG tablet Take 1 tablet (7.5 mg total) by mouth daily. 08/12/14   Raelyn Ensignodd Ladarious Kresse, PA   Medications, allergies, past medical history, surgical history, family history, social history and problem list reviewed and updated.  HPI  [age] yom with no significant PMH presents with low back pain.  [place] Was doing drywall, using saw, painting, and doing lot of lifting. No actual trauma at that time and does not recall any pain that day.  When awoke next day noticed low back was very sore. This has continued through to today. The pain is located across his low back, described as soreness. He also feels that both of his glutes have been tight the past couple days. He denies any numbness or weakness down either extremity. No tingling down either leg. No loss bowel/bladder fx. No perianal loss of sensation. The pain is moderate and constant, worsens a bit with movement. No radiation.   He has taken occasional advil with some relief. He has hydrocodone from recent hand surgery but has not used any of this.   Review of Systems No CP, SOB, fever, chills     Objective:   Physical Exam    Constitutional: He is oriented to person, place, and time. He appears well-developed and well-nourished.  Non-toxic appearance. He does not have a sickly appearance. He does not appear ill. No distress.  BP 138/80 mmHg  Pulse 77  Temp(Src) 98.4 F (36.9 C) (Oral)  Resp 18  Ht 5\' 11"  (1.803 m)  Wt 230 lb (104.327 kg)  BMI 32.09 kg/m2  SpO2 99%   Musculoskeletal:       Lumbar back: He exhibits tenderness and spasm. He exhibits no bony tenderness.       Back:  TTP across lumbar musculature. No spinal TTP. Spasm noted across lumbar region and right gluteal. No rash. No loss of sensation.   Neurological: He is alert and oriented to person, place, and time. He has normal strength. No sensory deficit.  Negative SLR bilaterally.   Psychiatric: He has a normal mood and affect. His speech is normal.      Assessment & Plan:   [age] yom with no significant PMH presents with low back pain.  Muscle spasm of back - Plan: cyclobenzaprine (FLEXERIL) 10 MG tablet, meloxicam (MOBIC) 7.5 MG tablet --flexeril, mobic, heat/ice --no sciatica --no compression concern --once spasm resolved low back stretching and strengthening. Exercises given.   Donnajean Lopesodd M. Teruo Stilley, PA-C Physician Assistant-Certified [facility]  08/12/2014 8:52 PM

## 2014-12-09 ENCOUNTER — Ambulatory Visit (INDEPENDENT_AMBULATORY_CARE_PROVIDER_SITE_OTHER): Payer: Managed Care, Other (non HMO) | Admitting: Physician Assistant

## 2014-12-09 VITALS — BP 114/74 | HR 77 | Temp 98.1°F | Resp 16 | Ht 71.0 in | Wt 224.5 lb

## 2014-12-09 DIAGNOSIS — H6123 Impacted cerumen, bilateral: Secondary | ICD-10-CM | POA: Diagnosis not present

## 2014-12-09 DIAGNOSIS — H9313 Tinnitus, bilateral: Secondary | ICD-10-CM

## 2014-12-09 MED ORDER — LORATADINE-PSEUDOEPHEDRINE ER 5-120 MG PO TB12: 1.0000 | ORAL_TABLET | Freq: Two times a day (BID) | ORAL | Status: DC

## 2014-12-09 NOTE — Patient Instructions (Signed)
Tinnitus °Sounds you hear in your ears and coming from within the ear is called tinnitus. This can be a symptom of many ear disorders. It is often associated with hearing loss.  °Tinnitus can be seen with: °· Infections. °· Ear blockages such as wax buildup. °· Meniere's disease. °· Ear damage. °· Inherited. °· Occupational causes. °While irritating, it is not usually a threat to health. When the cause of the tinnitus is wax, infection in the middle ear, or foreign body it is easily treated. Hearing loss will usually be reversible.  °TREATMENT  °When treating the underlying cause does not get rid of tinnitus, it may be necessary to get rid of the unwanted sound by covering it up with more pleasant background noises. This may include music, the radio etc. There are tinnitus maskers which can be worn which produce background noise to cover up the tinnitus. °Avoid all medications which tend to make tinnitus worse such as alcohol, caffeine, aspirin, and nicotine. There are many soothing background tapes such as rain, ocean, thunderstorms, etc. These soothing sounds help with sleeping or resting. °Keep all follow-up appointments and referrals. This is important to identify the cause of the problem. It also helps avoid complications, impaired hearing, disability, or chronic pain. °Document Released: 08/20/2005 Document Revised: 11/12/2011 Document Reviewed: 04/07/2008 °ExitCare® Patient Information ©2015 [employer]. This information is not intended to replace advice given to you by your health care provider. Make sure you discuss any questions you have with your health care provider. ° °

## 2014-12-09 NOTE — Progress Notes (Signed)
   12/09/2014 at 7:10 PM  Shane Kerr / DOB: April 26, 1956 / MRN: [id number]  The patient  does not have a problem list on file.  SUBJECTIVE  Chief complaint: Tinnitus   History of present illness: Shane Kerr is 59 y.o. well appearing male presenting for bilateral non pulsatile tinnitus that started roughly two weeks ago. His symptoms are bothersome, but not severe.  He has tried pseudoephedrine with some relief and he has also tried Claritin with some relief.  He does report sneezing more often lately, but does not report a history of seasonal allergies. He denies a change in hearing, ASA use, drinks two cups of coffee daily, and do not endorse jaw pain or clicking. He denies dizziness and weakness.       He  has a past medical history of Depression.    He has a current medication list which includes the following prescription(s): sertraline and loratadine-pseudoephedrine.  Shane Kerr is allergic to sulfa antibiotics. He  reports that he has never smoked. He has never used smokeless tobacco. He reports that he does not drink alcohol or use illicit drugs. He  has no sexual activity history on file. The patient  has past surgical history that includes Hand surgery (Right, 06/2014).  His family history includes Arthritis in his mother; Heart disease in his mother.  ROS  OBJECTIVE  His  height is 5\' 11"  (1.803 m) and weight is 224 lb 8 oz (101.833 kg). His oral temperature is 98.1 F (36.7 C). His blood pressure is 114/74 and his pulse is 77. His respiration is 16 and oxygen saturation is 97%.  The patient's body mass index is 31.33 kg/(m^2).  Physical Exam  Constitutional: He is oriented to person, place, and time. He appears well-developed and well-nourished.  HENT:  Ears:  Nose: No mucosal edema.  Mouth/Throat: Uvula is midline, oropharynx is clear and moist and mucous membranes are normal.  Respiratory: Effort normal and breath sounds normal.  Neurological: He is alert and oriented  to person, place, and time.  Skin: Skin is warm and dry.  Psychiatric: He has a normal mood and affect.   Post bilateral ear lavage patient's TM pearly gray and without abnormality.   No results found for this or any previous visit (from the past 24 hour(s)).  ASSESSMENT & PLAN  Shane Kerr was seen today for tinnitus.  Diagnoses and all orders for this visit:  Tinnitus, bilateral: Per patient report, he has less tinnitus after ear lavage.  This problem may have been secondary to problem 2.  Advised that he wait a day or two before administering the below medication, as his symptoms may resolve with ear lavage.  Orders: -     loratadine-pseudoephedrine (CLARITIN-D 12 HOUR) 5-120 MG per tablet; Take 1 tablet by mouth 2 (two) times daily.  Cerumen impaction, bilateral Orders: -     Ear wax removal    The patient was advised to call or come back to clinic if he does not see an improvement in symptoms, or worsens with the above plan.   Deliah BostonMichael Rafal Archuleta, MHS, PA-C [facility] 12/09/2014 7:10 PM

## 2014-12-10 ENCOUNTER — Telehealth: Payer: Self-pay

## 2014-12-10 ENCOUNTER — Ambulatory Visit (INDEPENDENT_AMBULATORY_CARE_PROVIDER_SITE_OTHER): Payer: Managed Care, Other (non HMO) | Admitting: Family Medicine

## 2014-12-10 VITALS — BP 96/68 | HR 107 | Temp 99.7°F | Resp 18 | Ht 71.0 in | Wt 224.8 lb

## 2014-12-10 DIAGNOSIS — R197 Diarrhea, unspecified: Secondary | ICD-10-CM | POA: Diagnosis not present

## 2014-12-10 DIAGNOSIS — R112 Nausea with vomiting, unspecified: Secondary | ICD-10-CM

## 2014-12-10 LAB — POCT CBC
Granulocyte percent: 86.9 %G — AB (ref 37–80)
HCT, POC: 49.4 % (ref 43.5–53.7)
Hemoglobin: 16.2 g/dL (ref 14.1–18.1)
Lymph, poc: 1.1 (ref 0.6–3.4)
MCH, POC: 30.1 pg (ref 27–31.2)
MCHC: 32.7 g/dL (ref 31.8–35.4)
MCV: 92 fL (ref 80–97)
MID (cbc): 0.6 (ref 0–0.9)
MPV: 6.6 fL (ref 0–99.8)
POC Granulocyte: 11.4 — AB (ref 2–6.9)
POC LYMPH PERCENT: 8.7 %L — AB (ref 10–50)
POC MID %: 4.4 %M (ref 0–12)
Platelet Count, POC: 232 10*3/uL (ref 142–424)
RBC: 5.38 M/uL (ref 4.69–6.13)
RDW, POC: 14.8 %
WBC: 13.1 10*3/uL — AB (ref 4.6–10.2)

## 2014-12-10 MED ORDER — ONDANSETRON 4 MG PO TBDP
4.0000 mg | ORAL_TABLET | Freq: Once | ORAL | Status: AC
  Administered 2014-12-10: 4 mg via ORAL

## 2014-12-10 NOTE — Progress Notes (Signed)
Subjective:  This chart was scribed for Elvina Sidle, MD by Carl Best, Medical Scribe. This patient was seen in Room 1 and the patient's care was started at 4:57 PM.   Patient ID: Shane Kerr, male    DOB: [birth year], 59 y.o.   MRN: [id number]  HPI HPI Comments: Shane Kerr is a 59 y.o. male who [facility] complaining of constant myalgias, diarrhea, and a low grade fever that started this morning when he woke up.  He is not dizzy but does feel weak.  He only had one episode of vomiting today.  He denies sick contacts but states that someone who works across [employer] from him had the flu.   He denies weakness, hematochezia, and abdominal cramps as associated symptoms.  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     Past Medical History  Diagnosis Date   Depression    Past Surgical History  Procedure Laterality Date   Hand surgery Right 06/2014    Trigger finger   Family History  Problem Relation Age of Onset   Heart disease Mother    Arthritis Mother    History   Social History   Marital Status: Married    Spouse Name: N/A   Number of Children: N/A   Years of Education: N/A   Occupational History   Not on file.   Social History Main Topics   Smoking status: Never Smoker    Smokeless tobacco: Never Used   Alcohol Use: No   Drug Use: No   Sexual Activity: Not on file   Other Topics Concern   Not on file   Social History Narrative   Allergies  Allergen Reactions   Sulfa Antibiotics Nausea And Vomiting    Review of Systems  Constitutional: Positive for fever.  Gastrointestinal: Positive for vomiting and diarrhea. Negative for blood in stool.  Musculoskeletal: Positive for myalgias.  Neurological: Positive for weakness. Negative for dizziness and light-headedness.     Objective:  Physical Exam  Constitutional: He is oriented to person, place, and time. He appears well-developed and  well-nourished.  HENT:  Head: Normocephalic and atraumatic.  Eyes: EOM are normal.  Neck: Normal range of motion.  Cardiovascular: Normal rate.   Pulmonary/Chest: Effort normal.  Abdominal: Soft. Normal appearance. Bowel sounds are increased. There is tenderness.  Mildly tender in epigastrium.  Musculoskeletal: Normal range of motion.  Neurological: He is alert and oriented to person, place, and time.  Skin: Skin is warm and dry.  Psychiatric: He has a normal mood and affect. His behavior is normal.  Nursing note and vitals reviewed.  Results for orders placed or performed in visit on 12/10/14  POCT CBC  Result Value Ref Range   WBC 13.1 (A) 4.6 - 10.2 K/uL   Lymph, poc 1.1 0.6 - 3.4   POC LYMPH PERCENT 8.7 (A) 10 - 50 %L   MID (cbc) 0.6 0 - 0.9   POC MID % 4.4 0 - 12 %M   POC Granulocyte 11.4 (A) 2 - 6.9   Granulocyte percent 86.9 (A) 37 - 80 %G   RBC 5.38 4.69 - 6.13 M/uL   Hemoglobin 16.2 14.1 - 18.1 g/dL   HCT, POC 25.3 66.4 - 53.7 %   MCV 92.0 80 - 97 fL   MCH, POC 30.1 27 - 31.2 pg   MCHC 32.7 31.8 - 35.4 g/dL   RDW, POC 40.3 %   Platelet  Count, POC 232 142 - 424 K/uL   MPV 6.6 0 - 99.8 fL     BP 96/68 mmHg   Pulse 107   Temp(Src) 99.7 F (37.6 C) (Oral)   Resp 18   Ht 5\' 11"  (1.803 m)   Wt 224 lb 12.8 oz (101.969 kg)   BMI 31.37 kg/m2   SpO2 96%  Assessment & Plan:   This chart was scribed in my presence and reviewed by me personally.    ICD-9-CM ICD-10-CM   1. Diarrhea 787.91 R19.7 POCT CBC     ondansetron (ZOFRAN-ODT) disintegrating tablet 4 mg  2. Nausea and vomiting, vomiting of unspecified type 787.01 R11.2 POCT CBC     ondansetron (ZOFRAN-ODT) disintegrating tablet 4 mg     Signed, Elvina SidleKurt Lauenstein, MD

## 2014-12-10 NOTE — Telephone Encounter (Signed)
Pt saw Dr. Elbert EwingsL this evening and was under the impression that he was supposed to leave with an rx of some sort to address the issues that he came in for. Zofran was administered during this visit. I don't see anything else prescribed in his chart other than the claritin from his visit 12/09/14. Can we call him and let him know if theres something additional that Dr. Elbert EwingsL recommends?

## 2014-12-10 NOTE — Patient Instructions (Signed)
Clear Liquid Diet A clear liquid diet is a short-term diet that is prescribed to provide the necessary fluid and basic energy you need when you can have nothing else. The clear liquid diet consists of liquids or solids that will become liquid at room temperature. You should be able to see through the liquid. There are many reasons that you may be restricted to clear liquids, such as:  When you have a sudden-onset (acute) condition that occurs before or after surgery.  To help your body slowly get adjusted to food again after a long period when you were unable to have food.  Replacement of fluids when you have a diarrheal disease.  When you are going to have certain exams, such as a colonoscopy, in which instruments are inserted inside your body to look at parts of your digestive system. WHAT CAN I HAVE? A clear liquid diet does not provide all the nutrients you need. It is important to choose a variety of the following items to get as many nutrients as possible:  Vegetable juices that do not have pulp.  Fruit juices and fruit drinks that do not have pulp.  Coffee (regular or decaffeinated), tea, or soda at the discretion of your health care provider.  Clear bouillon, broth, or strained broth-based soups.  High-protein and flavored gelatins.  Sugar or honey.  Ices or frozen ice pops that do not contain milk. If you are not sure whether you can have certain items, you should ask your health care provider. You may also ask your health care provider if there are any other clear liquid options. Document Released: 08/20/2005 Document Revised: 08/25/2013 Document Reviewed: 07/17/2013 [facility] Patient Information 2015 [place], [place]. This information is not intended to replace advice given to you by your health care provider. Make sure you discuss any questions you have with your health care provider. Diarrhea Diarrhea is frequent loose and watery bowel movements. It can cause you to feel weak  and dehydrated. Dehydration can cause you to become tired and thirsty, have a dry mouth, and have decreased urination that often is dark yellow. Diarrhea is a sign of another problem, most often an infection that will not last long. In most cases, diarrhea typically lasts 2-3 days. However, it can last longer if it is a sign of something more serious. It is important to treat your diarrhea as directed by your caregiver to lessen or prevent future episodes of diarrhea. CAUSES  Some common causes include:  Gastrointestinal infections caused by viruses, bacteria, or parasites.  Food poisoning or food allergies.  Certain medicines, such as antibiotics, chemotherapy, and laxatives.  Artificial sweeteners and fructose.  Digestive disorders. HOME CARE INSTRUCTIONS  Ensure adequate fluid intake (hydration): Have 1 cup (8 oz) of fluid for each diarrhea episode. Avoid fluids that contain simple sugars or sports drinks, fruit juices, whole milk products, and sodas. Your urine should be clear or pale yellow if you are drinking enough fluids. Hydrate with an oral rehydration solution that you can purchase at pharmacies, retail stores, and online. You can prepare an oral rehydration solution at home by mixing the following ingredients together:   - tsp table salt.   tsp baking soda.   tsp salt substitute containing potassium chloride.  1  tablespoons sugar.  1 L (34 oz) of water.  Certain foods and beverages may increase the speed at which food moves through the gastrointestinal (GI) tract. These foods and beverages should be avoided and include:  Caffeinated and alcoholic beverages.  High-fiber foods, such as raw fruits and vegetables, nuts, seeds, and whole grain breads and cereals.  Foods and beverages sweetened with sugar alcohols, such as xylitol, sorbitol, and mannitol.  Some foods may be well tolerated and may help thicken stool including:  Starchy foods, such as rice, toast, pasta,  low-sugar cereal, oatmeal, grits, baked potatoes, crackers, and bagels.  Bananas.  Applesauce.  Add probiotic-rich foods to help increase healthy bacteria in the GI tract, such as yogurt and fermented milk products.  Wash your hands well after each diarrhea episode.  Only take over-the-counter or prescription medicines as directed by your caregiver.  Take a warm bath to relieve any burning or pain from frequent diarrhea episodes. SEEK IMMEDIATE MEDICAL CARE IF:   You are unable to keep fluids down.  You have persistent vomiting.  You have blood in your stool, or your stools are black and tarry.  You do not urinate in 6-8 hours, or there is only a small amount of very dark urine.  You have abdominal pain that increases or localizes.  You have weakness, dizziness, confusion, or light-headedness.  You have a severe headache.  Your diarrhea gets worse or does not get better.  You have a fever or persistent symptoms for more than 2-3 days.  You have a fever and your symptoms suddenly get worse. MAKE SURE YOU:   Understand these instructions.  Will watch your condition.  Will get help right away if you are not doing well or get worse. Document Released: 08/10/2002 Document Revised: 01/04/2014 Document Reviewed: 04/27/2012 [facility] Patient Information 2015 [place], [place]. This information is not intended to replace advice given to you by your health care provider. Make sure you discuss any questions you have with your health care provider. Nausea and Vomiting Nausea is a sick feeling that often comes before throwing up (vomiting). Vomiting is a reflex where stomach contents come out of your mouth. Vomiting can cause severe loss of body fluids (dehydration). Children and elderly adults can become dehydrated quickly, especially if they also have diarrhea. Nausea and vomiting are symptoms of a condition or disease. It is important to find the cause of your symptoms. CAUSES    Direct irritation of the stomach lining. This irritation can result from increased acid production (gastroesophageal reflux disease), infection, food poisoning, taking certain medicines (such as nonsteroidal anti-inflammatory drugs), alcohol use, or tobacco use.  Signals from the brain.These signals could be caused by a headache, heat exposure, an inner ear disturbance, increased pressure in the brain from injury, infection, a tumor, or a concussion, pain, emotional stimulus, or metabolic problems.  An obstruction in the gastrointestinal tract (bowel obstruction).  Illnesses such as diabetes, hepatitis, gallbladder problems, appendicitis, kidney problems, cancer, sepsis, atypical symptoms of a heart attack, or eating disorders.  Medical treatments such as chemotherapy and radiation.  Receiving medicine that makes you sleep (general anesthetic) during surgery. DIAGNOSIS Your caregiver may ask for tests to be done if the problems do not improve after a few days. Tests may also be done if symptoms are severe or if the reason for the nausea and vomiting is not clear. Tests may include:  Urine tests.  Blood tests.  Stool tests.  Cultures (to look for evidence of infection).  X-rays or other imaging studies. Test results can help your caregiver make decisions about treatment or the need for additional tests. TREATMENT You need to stay well hydrated. Drink frequently but in small amounts.You may wish to drink water, sports drinks, clear  broth, or eat frozen ice pops or gelatin dessert to help stay hydrated.When you eat, eating slowly may help prevent nausea.There are also some antinausea medicines that may help prevent nausea. HOME CARE INSTRUCTIONS   Take all medicine as directed by your caregiver.  If you do not have an appetite, do not force yourself to eat. However, you must continue to drink fluids.  If you have an appetite, eat a normal diet unless your caregiver tells you  differently.  Eat a variety of complex carbohydrates (rice, wheat, potatoes, bread), lean meats, yogurt, fruits, and vegetables.  Avoid high-fat foods because they are more difficult to digest.  Drink enough water and fluids to keep your urine clear or pale yellow.  If you are dehydrated, ask your caregiver for specific rehydration instructions. Signs of dehydration may include:  Severe thirst.  Dry lips and mouth.  Dizziness.  Dark urine.  Decreasing urine frequency and amount.  Confusion.  Rapid breathing or pulse. SEEK IMMEDIATE MEDICAL CARE IF:   You have blood or brown flecks (like coffee grounds) in your vomit.  You have black or bloody stools.  You have a severe headache or stiff neck.  You are confused.  You have severe abdominal pain.  You have chest pain or trouble breathing.  You do not urinate at least once every 8 hours.  You develop cold or clammy skin.  You continue to vomit for longer than 24 to 48 hours.  You have a fever. MAKE SURE YOU:   Understand these instructions.  Will watch your condition.  Will get help right away if you are not doing well or get worse. Document Released: 08/20/2005 Document Revised: 11/12/2011 Document Reviewed: 01/17/2011 [facility] Patient Information 2015 [place], [place]. This information is not intended to replace advice given to you by your health care provider. Make sure you discuss any questions you have with your health care provider.

## 2014-12-12 ENCOUNTER — Ambulatory Visit (INDEPENDENT_AMBULATORY_CARE_PROVIDER_SITE_OTHER): Payer: Managed Care, Other (non HMO) | Admitting: Family Medicine

## 2014-12-12 VITALS — BP 114/80 | HR 82 | Temp 97.9°F | Resp 16 | Ht 71.0 in | Wt 221.0 lb

## 2014-12-12 DIAGNOSIS — R112 Nausea with vomiting, unspecified: Secondary | ICD-10-CM

## 2014-12-12 DIAGNOSIS — R197 Diarrhea, unspecified: Secondary | ICD-10-CM

## 2014-12-12 MED ORDER — ONDANSETRON 8 MG PO TBDP: 8.0000 mg | ORAL_TABLET | Freq: Three times a day (TID) | ORAL | Status: DC | PRN

## 2014-12-12 NOTE — Patient Instructions (Signed)
At this stage, it would be important to start some probiotics (align or Culturelle of the best known) to be taken twice a day for the next several days to restore the normal bacteria in your gastrointestinal tract   Nausea and Vomiting Nausea is a sick feeling that often comes before throwing up (vomiting). Vomiting is a reflex where stomach contents come out of your mouth. Vomiting can cause severe loss of body fluids (dehydration). Children and elderly adults can become dehydrated quickly, especially if they also have diarrhea. Nausea and vomiting are symptoms of a condition or disease. It is important to find the cause of your symptoms. CAUSES   Direct irritation of the stomach lining. This irritation can result from increased acid production (gastroesophageal reflux disease), infection, food poisoning, taking certain medicines (such as nonsteroidal anti-inflammatory drugs), alcohol use, or tobacco use.  Signals from the brain.These signals could be caused by a headache, heat exposure, an inner ear disturbance, increased pressure in the brain from injury, infection, a tumor, or a concussion, pain, emotional stimulus, or metabolic problems.  An obstruction in the gastrointestinal tract (bowel obstruction).  Illnesses such as diabetes, hepatitis, gallbladder problems, appendicitis, kidney problems, cancer, sepsis, atypical symptoms of a heart attack, or eating disorders.  Medical treatments such as chemotherapy and radiation.  Receiving medicine that makes you sleep (general anesthetic) during surgery. DIAGNOSIS Your caregiver may ask for tests to be done if the problems do not improve after a few days. Tests may also be done if symptoms are severe or if the reason for the nausea and vomiting is not clear. Tests may include:  Urine tests.  Blood tests.  Stool tests.  Cultures (to look for evidence of infection).  X-rays or other imaging studies. Test results can help your caregiver  make decisions about treatment or the need for additional tests. TREATMENT You need to stay well hydrated. Drink frequently but in small amounts.You may wish to drink water, sports drinks, clear broth, or eat frozen ice pops or gelatin dessert to help stay hydrated.When you eat, eating slowly may help prevent nausea.There are also some antinausea medicines that may help prevent nausea. HOME CARE INSTRUCTIONS   Take all medicine as directed by your caregiver.  If you do not have an appetite, do not force yourself to eat. However, you must continue to drink fluids.  If you have an appetite, eat a normal diet unless your caregiver tells you differently.  Eat a variety of complex carbohydrates (rice, wheat, potatoes, bread), lean meats, yogurt, fruits, and vegetables.  Avoid high-fat foods because they are more difficult to digest.  Drink enough water and fluids to keep your urine clear or pale yellow.  If you are dehydrated, ask your caregiver for specific rehydration instructions. Signs of dehydration may include:  Severe thirst.  Dry lips and mouth.  Dizziness.  Dark urine.  Decreasing urine frequency and amount.  Confusion.  Rapid breathing or pulse. SEEK IMMEDIATE MEDICAL CARE IF:   You have blood or brown flecks (like coffee grounds) in your vomit.  You have black or bloody stools.  You have a severe headache or stiff neck.  You are confused.  You have severe abdominal pain.  You have chest pain or trouble breathing.  You do not urinate at least once every 8 hours.  You develop cold or clammy skin.  You continue to vomit for longer than 24 to 48 hours.  You have a fever. MAKE SURE YOU:   Understand these instructions.  Will watch your condition.  Will get help right away if you are not doing well or get worse. Document Released: 08/20/2005 Document Revised: 11/12/2011 Document Reviewed: 01/17/2011 [facility] Patient Information 2015 [employer] [place].  This information is not intended to replace advice given to you by your health care provider. Make sure you discuss any questions you have with your health care provider.

## 2014-12-12 NOTE — Progress Notes (Signed)
° °  Subjective:  This chart was scribed for Elvina SidleKurt Lauenstein, MD, by Elon SpannerGarrett Cook, Medical Scribe. This patient was seen in room Rm 14 and the patient's care was started at 1:15 PM.   Patient ID: Shane Kerr, male    DOB: Nov 04, 1955, 59 y.o.   MRN: [id number]  HPI HPI Comments: Shane Kerr is a 59 y.o. male who [facility], vomiting, and diarrhea.  Patient reports his condition is improving with his bowel movements returning to normal, and a resolution of his nausea, vomiting, and fever.  However, he still notes some general weakness and night-time diaphoresis last night.  Patient has taken Imodium and Tylenol with relief.  Patient tolerated oatmeal this morning and is drinking plenty of clear fluids.  Fever is resolved.    [place]    Review of Systems  Constitutional: Negative for fever.  Gastrointestinal: Negative for nausea, vomiting and diarrhea.       Objective:   Physical Exam Adult male in no acute distress with no jaundice or icterus, alert and articulate Neck: Supple Oropharynx: Moist mucous membranes Chest: Clear Heart: Regular and normal rate Abdomen: Hyperactive bowel sounds with no significant tenderness, no HSM, no masses, no rebound, no guarding Skin: Warm and dry without rash    Assessment & Plan:  1:22 PM Discussed treatment plan with patient at bedside.  Patient acknowledges and agrees with plan.   This chart was scribed in my presence and reviewed by me personally.    ICD-9-CM ICD-10-CM   1. Nausea vomiting and diarrhea 787.91 R11.2 ondansetron (ZOFRAN ODT) 8 MG disintegrating tablet   787.01 R19.7      Signed, Elvina SidleKurt Lauenstein, MD

## 2014-12-13 NOTE — Telephone Encounter (Signed)
Spoke with pt. He stated to me that the issue has been taken care of.

## 2015-07-14 ENCOUNTER — Other Ambulatory Visit: Payer: Self-pay | Admitting: Internal Medicine

## 2015-08-01 ENCOUNTER — Encounter: Payer: Self-pay | Admitting: Internal Medicine

## 2015-10-12 ENCOUNTER — Other Ambulatory Visit: Payer: Self-pay | Admitting: Internal Medicine

## 2015-11-12 ENCOUNTER — Other Ambulatory Visit: Payer: Self-pay | Admitting: Internal Medicine

## 2015-11-20 ENCOUNTER — Other Ambulatory Visit: Payer: Self-pay | Admitting: Internal Medicine

## 2015-11-25 ENCOUNTER — Ambulatory Visit (INDEPENDENT_AMBULATORY_CARE_PROVIDER_SITE_OTHER): Payer: Managed Care, Other (non HMO) | Admitting: Internal Medicine

## 2015-11-25 VITALS — BP 136/62 | HR 81 | Temp 98.4°F | Resp 16 | Ht 71.0 in | Wt 214.4 lb

## 2015-11-25 DIAGNOSIS — G4700 Insomnia, unspecified: Secondary | ICD-10-CM | POA: Diagnosis not present

## 2015-11-25 DIAGNOSIS — F329 Major depressive disorder, single episode, unspecified: Secondary | ICD-10-CM

## 2015-11-25 MED ORDER — SERTRALINE HCL 100 MG PO TABS: 100.0000 mg | ORAL_TABLET | Freq: Every day | ORAL | Status: DC

## 2015-11-25 NOTE — Patient Instructions (Signed)
     IF you received an x-ray today, you will receive an invoice from [facility]. Please contact [facility] at [phone] with questions or concerns regarding your invoice.   IF you received labwork today, you will receive an invoice from [facility]. Please contact [facility] at [phone] with questions or concerns regarding your invoice.   Our billing staff will not be able to assist you with questions regarding bills from these companies.  You will be contacted with the lab results as soon as they are available. The fastest way to get your results is to activate your My Chart account. Instructions are located on the last page of this paperwork. If you have not heard from us regarding the results in 2 weeks, please contact this office.      

## 2015-11-25 NOTE — Progress Notes (Signed)
   Subjective:  By signing my name below, I, Stann Oresung-Kai Tsai, attest that this documentation has been prepared under the direction and in the presence of Ellamae Siaobert Emmalia Heyboer, MD. Electronically Signed: Stann Oresung-Kai Tsai, Scribe. 11/25/2015 , 7:17 PM .  Patient was seen in Room 11 .   Patient ID: Shane Kerr, male    DOB: 02/16/1956, 60 y.o.   MRN: [id number] Chief Complaint  Patient presents with  . Medication Refill    zoloft   HPI Shane Kerr is a 60 y.o. male who [facility] on his [facility]oloft.  He's been going through a lot of stress with things going on at home. His daughter, who was majoring in [employer], started hanging out with the "wrong guy". She recently decided to leave home. Pt has sent her to a counselor.   He also reports having tinnitus in his right ear. He mentions having a history of a concussion years ago. It caused him to have some dental problems in his 2 upper front teeth.   Note extensive past history of depression, reactive to his environment, and in greatly improved with Zoloft. No other health issues.  There are no active problems to display for this patient.   Current outpatient prescriptions:  .  sertraline (ZOLOFT) 100 MG tablet, TAKE 1/2-1 TABLET BY MOUTH EVERY DAY, Disp: 15 tablet, Rfl: 0 .  loratadine-pseudoephedrine (CLARITIN-D 12 HOUR) 5-120 MG per tablet, Take 1 tablet by mouth 2 (two) times daily. (Patient not taking: Reported on 12/10/2014), Disp: 14 tablet, Rfl: 0  Review of Systems  Constitutional: Negative for fever, chills and fatigue.  HENT: Positive for tinnitus. Negative for congestion, ear pain and hearing loss.   Respiratory: Negative for cough.   Gastrointestinal: Negative for nausea, vomiting and diarrhea.  Skin: Negative for rash and wound.  Psychiatric/Behavioral: The patient is nervous/anxious.       Objective:   Physical Exam  Constitutional: He is oriented to person, place, and time. He appears  well-developed and well-nourished. No distress.  HENT:  Head: Normocephalic and atraumatic.  Right Ear: External ear normal.  Left Ear: External ear normal.  Eyes: EOM are normal. Pupils are equal, round, and reactive to light.  Neck: Neck supple.  Cardiovascular: Normal rate.   Pulmonary/Chest: Effort normal. No respiratory distress.  Musculoskeletal: Normal range of motion.  Neurological: He is alert and oriented to person, place, and time.  Skin: Skin is warm and dry.  Psychiatric: He has a normal mood and affect. His behavior is normal.  Nursing note and vitals reviewed.   BP 136/62 mmHg  Pulse 81  Temp(Src) 98.4 F (36.9 C) (Oral)  Resp 16  Ht 5\' 11"  (1.803 m)  Wt 214 lb 6.4 oz (97.251 kg)  BMI 29.92 kg/m2  SpO2 98%    Assessment & Plan:  I have completed the patient encounter in its entirety as documented by the scribe, with editing by me where necessary. Taner Rzepka P. Merla Richesoolittle, M.D.  Insomnia, unspecified  Reactive depression  Responding well to treatment Meds ordered this encounter  Medications  . sertraline (ZOLOFT) 100 MG tablet    Sig: Take 1 tablet (100 mg total) by mouth daily.    Dispense:  90 tablet    Refill:  3   We discussed follow-up after I retire

## 2016-04-23 ENCOUNTER — Encounter: Payer: Self-pay | Admitting: Urgent Care

## 2016-04-23 ENCOUNTER — Ambulatory Visit (INDEPENDENT_AMBULATORY_CARE_PROVIDER_SITE_OTHER): Payer: Managed Care, Other (non HMO) | Admitting: Urgent Care

## 2016-04-23 VITALS — BP 126/82 | HR 85 | Temp 98.3°F | Resp 18 | Ht 71.0 in | Wt 218.0 lb

## 2016-04-23 DIAGNOSIS — M62830 Muscle spasm of back: Secondary | ICD-10-CM

## 2016-04-23 DIAGNOSIS — M5380 Other specified dorsopathies, site unspecified: Secondary | ICD-10-CM

## 2016-04-23 MED ORDER — NAPROXEN SODIUM 550 MG PO TABS: 550.0000 mg | ORAL_TABLET | Freq: Two times a day (BID) | ORAL | 1 refills | Status: DC

## 2016-04-23 MED ORDER — CYCLOBENZAPRINE HCL 5 MG PO TABS: 5.0000 mg | ORAL_TABLET | Freq: Three times a day (TID) | ORAL | 1 refills | Status: DC | PRN

## 2016-04-23 NOTE — Patient Instructions (Addendum)
Muscle Cramps and Spasms Muscle cramps and spasms occur when a muscle or muscles tighten and you have no control over this tightening (involuntary muscle contraction). They are a common problem and can develop in any muscle. The most common place is in the calf muscles of the leg. Both muscle cramps and muscle spasms are involuntary muscle contractions, but they also have differences:   Muscle cramps are sporadic and painful. They may last a few seconds to a quarter of an hour. Muscle cramps are often more forceful and last longer than muscle spasms.  Muscle spasms may or may not be painful. They may also last just a few seconds or much longer. CAUSES  It is uncommon for cramps or spasms to be due to a serious underlying problem. In many cases, the cause of cramps or spasms is unknown. Some common causes are:   Overexertion.   Overuse from repetitive motions (doing the same thing over and over).   Remaining in a certain position for a long period of time.   Improper preparation, form, or technique while performing a sport or activity.   Dehydration.   Injury.   Side effects of some medicines.   Abnormally low levels of the salts and ions in your blood (electrolytes), especially potassium and calcium. This could happen if you are taking water pills (diuretics) or you are pregnant.  Some underlying medical problems can make it more likely to develop cramps or spasms. These include, but are not limited to:   Diabetes.   Parkinson disease.   Hormone disorders, such as thyroid problems.   Alcohol abuse.   Diseases specific to muscles, joints, and bones.   Blood vessel disease where not enough blood is getting to the muscles.  HOME CARE INSTRUCTIONS   Stay well hydrated. Drink enough water and fluids to keep your urine clear or pale yellow.  It may be helpful to massage, stretch, and relax the affected muscle.  For tight or tense muscles, use a warm towel, heating  pad, or hot shower water directed to the affected area.  If you are sore or have pain after a cramp or spasm, applying ice to the affected area may relieve discomfort.  Put ice in a plastic bag.  Place a towel between your skin and the bag.  Leave the ice on for 15-20 minutes, 03-04 times a day.  Medicines used to treat a known cause of cramps or spasms may help reduce their frequency or severity. Only take over-the-counter or prescription medicines as directed by your caregiver. SEEK MEDICAL CARE IF:  Your cramps or spasms get more severe, more frequent, or do not improve over time.  MAKE SURE YOU:   Understand these instructions.  Will watch your condition.  Will get help right away if you are not doing well or get worse.   This information is not intended to replace advice given to you by your health care provider. Make sure you discuss any questions you have with your health care provider.   Document Released: 02/09/2002 Document Revised: 12/15/2012 Document Reviewed: 08/06/2012 Elsevier Interactive Patient Education 2016 [employer].     IF you received an x-ray today, you will receive an invoice from [facility]. Please contact [facility] at [phone] with questions or concerns regarding your invoice.   IF you received labwork today, you will receive an invoice from [employer]. Please contact [facility] at [phone] with questions or concerns regarding your invoice.   Our billing staff  will not be able to assist you with questions regarding bills from these companies.  You will be contacted with the lab results as soon as they are available. The fastest way to get your results is to activate your My Chart account. Instructions are located on the last page of this paperwork. If you have not heard from [place] regarding the results in 2 weeks, please contact this office.

## 2016-04-23 NOTE — Progress Notes (Signed)
    MRN: [id number] DOB: 16-Nov-1955  Subjective:   Shane Kerr is a 60 y.o. male presenting for chief complaint of Spasms (back spasms since saturday)  Reports 3 day history of back spasms while doing light yardwork. Patient states that he reached down to pick up a shrub and has since felt lower back tightness that extends into his buttocks. Has difficulty walking, has been using a cane. Has tried Advil with some relief. Denies fever, bloody stools, constipation, dysuria, numbness or tingling, weakness, shooting pain, incontinence. Denies history of back injuries, back surgeries, arthritis.  Shane Kerr has a current medication list which includes the following prescription(s): loratadine-pseudoephedrine and sertraline. Also is allergic to sulfa antibiotics.  Shane Kerr  has a past medical history of Depression. Also  has a past surgical history that includes Hand surgery (Right, 06/2014).  Objective:   Vitals: BP 126/82 (BP Location: Right Arm, Patient Position: Sitting, Cuff Size: Large)   Pulse 85   Temp 98.3 F (36.8 C) (Oral)   Resp 18   Ht 5\' 11"  (1.803 m)   Wt 218 lb (98.9 kg)   SpO2 97%   BMI 30.40 kg/m   Physical Exam  Constitutional: He is oriented to person, place, and time. He appears well-developed and well-nourished.  Cardiovascular: Normal rate.   Pulmonary/Chest: Effort normal.  Musculoskeletal:       Thoracic back: He exhibits spasm. He exhibits normal range of motion, no tenderness, no bony tenderness, no swelling, no edema, no deformity and no laceration.       Lumbar back: He exhibits decreased range of motion (flexion, extension) and spasm (mid-lumbar region). He exhibits no tenderness, no bony tenderness, no swelling, no edema and no deformity.  Neurological: He is alert and oriented to person, place, and time.    Assessment and Plan :   1. Back tightness 2. Muscle spasm of back - Reviewed back care, conservative management. We will defer x-rays for now,  use NSAID, muscle relaxant. Perform back stretches at home. RTC in 2 weeks if no improvement.  Wallis BambergMario Earleen Aoun, PA-C [facility] [phone] 04/23/2016 8:39 AM

## 2017-06-13 DIAGNOSIS — Z23 Encounter for immunization: Secondary | ICD-10-CM | POA: Diagnosis not present

## 2017-06-28 DIAGNOSIS — M25512 Pain in left shoulder: Secondary | ICD-10-CM | POA: Diagnosis not present

## 2017-06-28 DIAGNOSIS — M25522 Pain in left elbow: Secondary | ICD-10-CM | POA: Diagnosis not present

## 2017-07-02 DIAGNOSIS — Z125 Encounter for screening for malignant neoplasm of prostate: Secondary | ICD-10-CM | POA: Diagnosis not present

## 2017-07-02 DIAGNOSIS — Z0000 Encounter for general adult medical examination without abnormal findings: Secondary | ICD-10-CM | POA: Diagnosis not present

## 2017-07-02 DIAGNOSIS — Z131 Encounter for screening for diabetes mellitus: Secondary | ICD-10-CM | POA: Diagnosis not present

## 2017-07-02 DIAGNOSIS — Z13220 Encounter for screening for lipoid disorders: Secondary | ICD-10-CM | POA: Diagnosis not present

## 2017-07-09 ENCOUNTER — Other Ambulatory Visit: Payer: Self-pay | Admitting: Internal Medicine

## 2017-07-09 DIAGNOSIS — M25512 Pain in left shoulder: Secondary | ICD-10-CM

## 2017-07-14 ENCOUNTER — Ambulatory Visit
Admission: RE | Admit: 2017-07-14 | Discharge: 2017-07-14 | Disposition: A | Discharge status: Home / Self Care | Payer: 59 | Source: Ambulatory Visit | Attending: Internal Medicine | Admitting: Internal Medicine

## 2017-07-14 DIAGNOSIS — M19012 Primary osteoarthritis, left shoulder: Secondary | ICD-10-CM | POA: Diagnosis not present

## 2017-07-14 DIAGNOSIS — M25512 Pain in left shoulder: Secondary | ICD-10-CM

## 2017-07-24 DIAGNOSIS — M7502 Adhesive capsulitis of left shoulder: Secondary | ICD-10-CM | POA: Diagnosis not present

## 2017-08-09 DIAGNOSIS — M7502 Adhesive capsulitis of left shoulder: Secondary | ICD-10-CM | POA: Diagnosis not present

## 2017-08-15 DIAGNOSIS — M7502 Adhesive capsulitis of left shoulder: Secondary | ICD-10-CM | POA: Diagnosis not present

## 2017-08-20 DIAGNOSIS — M7502 Adhesive capsulitis of left shoulder: Secondary | ICD-10-CM | POA: Diagnosis not present

## 2017-08-22 DIAGNOSIS — M7502 Adhesive capsulitis of left shoulder: Secondary | ICD-10-CM | POA: Diagnosis not present

## 2017-08-28 DIAGNOSIS — M7502 Adhesive capsulitis of left shoulder: Secondary | ICD-10-CM | POA: Diagnosis not present

## 2017-08-30 DIAGNOSIS — M7502 Adhesive capsulitis of left shoulder: Secondary | ICD-10-CM | POA: Diagnosis not present

## 2017-09-02 DIAGNOSIS — M7502 Adhesive capsulitis of left shoulder: Secondary | ICD-10-CM | POA: Diagnosis not present

## 2017-10-16 ENCOUNTER — Encounter: Payer: Self-pay | Admitting: Podiatry

## 2017-10-16 ENCOUNTER — Ambulatory Visit: Payer: 59 | Admitting: Podiatry

## 2017-10-16 DIAGNOSIS — M79675 Pain in left toe(s): Secondary | ICD-10-CM | POA: Diagnosis not present

## 2017-10-16 DIAGNOSIS — M79674 Pain in right toe(s): Secondary | ICD-10-CM

## 2017-10-16 DIAGNOSIS — B351 Tinea unguium: Secondary | ICD-10-CM | POA: Diagnosis not present

## 2017-10-16 NOTE — Progress Notes (Signed)
   Subjective:    Patient ID: Shane Kerr, male    DOB: 03/20/1956, 62 y.o.   MRN: [id number]  HPIthis patient presents to the office with chief complaint of a painful second toenail on the right foot.  He says that he believes he has a fungal infection to the nails on his right foot.  He says he woke up Monday and was experiencing pain under the nail on the second toe of the right foot.  He denies any history of trauma or injury to the nail.  He presents the office for an evaluation of his painful toe and treatment of his nails.    Review of Systems  All other systems reviewed and are negative.      Objective:   Physical Exam General Appearance  Alert, conversant and in no acute stress.  Vascular  Dorsalis pedis and posterior pulses are palpable  bilaterally.  Capillary return is within normal limits  bilaterally. Temperature is within normal limits  Bilaterally.  Neurologic  Senn-Weinstein monofilament wire test within normal limits  bilaterally. Muscle power within normal limits bilaterally.  Nails Thick disfigured discolored nails with subungual debris bilaterally from hallux to fifth toes right foot.. No evidence of bacterial infection or drainage bilaterally.  Orthopedic  No limitations of motion of motion feet bilaterally.  No crepitus or effusions noted.  No bony pathology or digital deformities noted.  Skin  normotropic skin with no porokeratosis noted bilaterally.  No signs of infections or ulcers noted.          Assessment & Plan:  Onychomycosis right foot  IE  Debride nails right foot x 5.  RTC prn.   Shane Kerr DPM

## 2017-10-22 DIAGNOSIS — M545 Low back pain: Secondary | ICD-10-CM | POA: Diagnosis not present

## 2017-10-22 DIAGNOSIS — Z1211 Encounter for screening for malignant neoplasm of colon: Secondary | ICD-10-CM | POA: Diagnosis not present

## 2017-11-18 ENCOUNTER — Other Ambulatory Visit: Payer: Self-pay | Admitting: Family Medicine

## 2017-11-18 DIAGNOSIS — M545 Low back pain: Secondary | ICD-10-CM

## 2017-11-24 ENCOUNTER — Ambulatory Visit
Admission: RE | Admit: 2017-11-24 | Discharge: 2017-11-24 | Disposition: A | Discharge status: Home / Self Care | Payer: 59 | Source: Ambulatory Visit | Attending: Family Medicine | Admitting: Family Medicine

## 2017-11-24 DIAGNOSIS — M48061 Spinal stenosis, lumbar region without neurogenic claudication: Secondary | ICD-10-CM | POA: Diagnosis not present

## 2017-11-24 DIAGNOSIS — M545 Low back pain: Secondary | ICD-10-CM

## 2018-04-07 DIAGNOSIS — M5440 Lumbago with sciatica, unspecified side: Secondary | ICD-10-CM | POA: Diagnosis not present

## 2018-04-30 ENCOUNTER — Other Ambulatory Visit: Payer: Self-pay | Admitting: Adult Health Nurse Practitioner

## 2018-04-30 DIAGNOSIS — G8929 Other chronic pain: Secondary | ICD-10-CM

## 2018-04-30 DIAGNOSIS — M545 Low back pain: Principal | ICD-10-CM

## 2018-05-11 ENCOUNTER — Ambulatory Visit
Admission: RE | Admit: 2018-05-11 | Discharge: 2018-05-11 | Disposition: A | Discharge status: Home / Self Care | Payer: 59 | Source: Ambulatory Visit | Attending: Adult Health Nurse Practitioner | Admitting: Adult Health Nurse Practitioner

## 2018-05-11 DIAGNOSIS — M545 Low back pain: Principal | ICD-10-CM

## 2018-05-11 DIAGNOSIS — G8929 Other chronic pain: Secondary | ICD-10-CM

## 2018-05-12 ENCOUNTER — Other Ambulatory Visit: Payer: 59

## 2018-06-02 DIAGNOSIS — M5416 Radiculopathy, lumbar region: Secondary | ICD-10-CM | POA: Insufficient documentation

## 2018-06-02 DIAGNOSIS — M545 Low back pain: Secondary | ICD-10-CM | POA: Diagnosis not present

## 2018-06-02 DIAGNOSIS — M5126 Other intervertebral disc displacement, lumbar region: Secondary | ICD-10-CM | POA: Diagnosis present

## 2018-06-03 ENCOUNTER — Other Ambulatory Visit: Payer: Self-pay | Admitting: Neurosurgery

## 2018-06-10 NOTE — H&P (Signed)
Patient ID:   [id number] Patient: Shane Kerr  Date of Birth: February 12, 1956 Visit Type: Office Visit   Date: 06/02/2018 03:30 PM Provider: Danae Orleans. Venetia Maxon MD   This 62 year old male presents for back pain and leg pain and feet tingling.  HISTORY OF PRESENT ILLNESS:  1.  back pain  2.  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  Patient recalls 1 episode of severe back pain in February after lifting a heavy object.  Symptoms resolved.  He experienced a 2nd episode of severe back pain and bilateral leg numbness after yd work in August.  Both episodes required 2 days bedrest and a cane for ambulation. He notes his symptoms are much worse on the left. Currently, symptoms are controlled with Mobic 7.5 mg 2 per day and Flexeril 5 mg 1-2 per day  History:  Healthy Surgical history:  Left knee scope  MRIs March and September 2019 on [place]          PAST MEDICAL/SURGICAL HISTORY:   (Detailed)    Disease/disorder Onset Date Management Date Comments    Knee Arthrscopy L knee  JMP 06/02/2018 -     Family History:  (Detailed) Patient reports there is no relevant family history.     Social History:  (Detailed) Tobacco use reviewed. Preferred language is [place].   Tobacco use status: Current non-smoker. Smoking status: Never smoker.  SMOKING STATUS Type Smoking Status Usage Per Day Years Used Total Pack Years   Never smoker      TOBACCO/VAPING EXPOSURE No passive vaping exposure. No passive smoke exposure.   HOME ENVIRONMENT/SAFETY The patient is not at risk for falls. The patient has not fallen in the last year.     MEDICATIONS: (added, continued or stopped this visit) Started Medication Directions Instruction Stopped   cyclobenzaprine 5 mg tablet take 1 tablet by oral route 3 times every day     meloxicam 15 mg tablet take 1 tablet by oral route  every day        ALLERGIES: Ingredient Reaction Medication Name Comment  NO KNOWN ALLERGIES     No known allergies.   REVIEW OF SYSTEMS   See scanned patient registration form, dated, signed and dated on   Review of Systems Details System Neg/Pos Details  Constitutional Negative Chills, Fatigue, Fever, Malaise, Night sweats, Weight gain and Weight loss.  ENMT Negative Ear drainage, Hearing loss, Nasal drainage, Otalgia, Sinus pressure and Sore throat.  Eyes Negative Eye discharge, Eye pain and Vision changes.  Respiratory Negative Chronic cough, Cough, Dyspnea, Known TB exposure and Wheezing.  Cardio Negative Chest pain, Claudication, Edema and Irregular heartbeat/palpitations.  GI Negative Abdominal pain, Blood in stool, Change in stool pattern, Constipation, Decreased appetite, Diarrhea, Heartburn, Nausea and Vomiting.  GU Negative Dribbling, Dysuria, Erectile dysfunction, Hematuria, Polyuria (Genitourinary), Slow stream, Urinary frequency, Urinary incontinence and Urinary retention.  Endocrine Negative Cold intolerance, Heat intolerance, Polydipsia and Polyphagia.  Neuro Positive Numbness in extremity.  Psych Negative Anxiety, Depression and Insomnia.  Integumentary Negative Brittle hair, Brittle nails, Change in shape/size of mole(s), Hair loss, Hirsutism, Hives, Pruritus, Rash and Skin lesion.  MS Positive Back pain.  Hema/Lymph Negative Easy bleeding, Easy bruising and Lymphadenopathy.  Allergic/Immuno Negative Contact allergy, Environmental allergies, Food allergies and Seasonal allergies.  Reproductive Negative Penile discharge and Sexual dysfunction.   PHYSICAL EXAM:   Vitals Date Temp F BP Pulse Ht In Wt  Lb BMI BSA Pain Score  06/02/2018  125/61 60 71 224 31.24  3/10    PHYSICAL EXAM Details General Level of Distress: no acute distress Overall Appearance: normal  Head and Face  Right Left  Fundoscopic Exam:  normal normal    Cardiovascular Cardiac: regular rate and  rhythm without murmur  Right Left  Carotid Pulses: normal normal  Respiratory Lungs: clear to auscultation  Neurological Orientation: normal Recent and Remote Memory: normal Attention Span and Concentration:   normal Language: normal Fund of Knowledge: normal  Right Left Sensation: normal normal Upper Extremity Coordination: normal normal  Lower Extremity Coordination: normal normal  Musculoskeletal Gait and Station: normal  Right Left Upper Extremity Muscle Strength: normal normal Lower Extremity Muscle Strength: normal normal Upper Extremity Muscle Tone:  normal normal Lower Extremity Muscle Tone: normal normal   Motor Strength Upper and lower extremity motor strength was tested in the clinically pertinent muscles.     Deep Tendon Reflexes  Right Left Biceps: normal normal Triceps: normal normal Brachioradialis: normal normal Patellar: normal normal Achilles: normal normal  Cranial Nerves II. Optic Nerve/Visual Fields: normal III. Oculomotor: normal IV. Trochlear: normal V. Trigeminal: normal VI. Abducens: normal VII. Facial: normal VIII. Acoustic/Vestibular: normal IX. Glossopharyngeal: normal X. Vagus: normal XI. Spinal Accessory: normal XII. Hypoglossal: normal  Motor and other Tests Lhermittes: negative Rhomberg: negative Pronator drift: absent     Right Left Hoffman's: normal normal Clonus: normal normal Babinski: normal normal SLR:  positive at 30 degrees   Additional Findings:  Upon examination, ankle reflexes preserved, 8 inches above floor during toe touch, plantar flexion weakness on left, able to stand on heels and toes, full strength in bilateral upper extremities.   DIAGNOSTIC RESULTS:   05/11/18 L-spine MRI without contrast 1. Mild enlargement of the left subarticular disc protrusion at L5-S1 with left S1 nerve root impingement. 2. Unchanged disc and facet degeneration elsewhere. 11/24/17 L-spine MRI without contrast 1. L3 and  L4 inferior endplate Schmorl's nodes with mild edema. 2. L5-S1 left subarticular disc protrusion with impingement of descending left S1 nerve root in the lateral recess. 3. Otherwise mild lumbar spondylosis with multilevel mild foraminal stenosis. No significant canal stenosis.     IMPRESSION:   L-spine MRI reveals left-sided ruptured disc at L5-S1 slightly worse compared to previous MRI, no major issue at other disc levels.  Upon examination, ankle reflexes preserved, 8 inches above floor during toe touch, plantar flexion weakness on left, able to stand on heels and toes, full strength in bilateral upper extremities, SLR  positive at 30 degrees on the left.   Recommended surgery due to weakness. Discussed surgical intervention - left L5-S1  microdiscectomy. Discussed that patient will likely remain out of work for at least 3-4 weeks. Recommended weight reduction.  PLAN:  1. Nurse education given 2. Left L5-S1 microdiscectomy scheduled 3. Follow-up after surgery      Pain Management Plan Pain Scale: 3/10. Method: Numeric Pain Intensity Scale. Location: back, legs and feet. Onset: 05/02/2018. Duration: varies. Quality: discomforting. Pain management follow-up plan of care: Patient taken medication as prescribed.  Fall Risk Plan The patient has not fallen in the last year.              Provider:  Danae Orleans. Venetia Maxon MD  06/02/2018 07:10 PM Dictation edited by: Briscoe Burns    CC Providers: [facility] [address] [place],  [place]  [address]-   [facility] [address] [place], [place] [address]-               Electronically signed by Danae Orleans Venetia Maxon MD on 06/05/2018 07:29 AM

## 2018-06-24 NOTE — Pre-Procedure Instructions (Signed)
Shane Kerr  06/24/2018      [id number] - [place], [place] - [address] [place] [address] Phone: [phone] Fax: [phone]    Your procedure is scheduled on Thursday October 31.  Report to [facility] Admitting at 1:30 P.M.  Call this number if you have problems the morning of surgery:  [phone]   Remember:  Do not eat or drink after midnight.    Take these medicines the morning of surgery with A SIP OF WATER: NONE  7 days prior to surgery STOP taking any Meloxicam (Mobic), Aspirin(unless otherwise instructed by your surgeon), Aleve, Naproxen, Ibuprofen, Motrin, Advil, Goody's, BC's, all herbal medications, fish oil, and all vitamins     Do not wear jewelry  Do not wear lotions, powders, or colognes, or deodorant.  Do not shave 48 hours prior to surgery.  Men may shave face and neck.  Do not bring valuables to the hospital.  [facility] is not responsible for any belongings or valuables.  Contacts, dentures or bridgework may not be worn into surgery.  Leave your suitcase in the car.  After surgery it may be brought to your room.  For patients admitted to the hospital, discharge time will be determined by your treatment team.  Patients discharged the day of surgery will not be allowed to drive home.   Special instructions:    [place]- Preparing For Surgery  Before surgery, you can play an important role. Because skin is not sterile, your skin needs to be as free of germs as possible. You can reduce the number of germs on your skin by washing with CHG (chlorahexidine gluconate) Soap before surgery.  CHG is an antiseptic cleaner which kills germs and bonds with the skin to continue killing germs even after washing.    Oral Hygiene is also important to reduce your risk of infection.  Remember - BRUSH YOUR TEETH THE MORNING OF SURGERY WITH YOUR REGULAR TOOTHPASTE  Please do  not use if you have an allergy to CHG or antibacterial soaps. If your skin becomes reddened/irritated stop using the CHG.  Do not shave (including legs and underarms) for at least 48 hours prior to first CHG shower. It is OK to shave your face.  Please follow these instructions carefully.   1. Shower the NIGHT BEFORE SURGERY and the MORNING OF SURGERY with CHG.   2. If you chose to wash your hair, wash your hair first as usual with your normal shampoo.  3. After you shampoo, rinse your hair and body thoroughly to remove the shampoo.  4. Use CHG as you would any other liquid soap. You can apply CHG directly to the skin and wash gently with a scrungie or a clean washcloth.   5. Apply the CHG Soap to your body ONLY FROM THE NECK DOWN.  Do not use on open wounds or open sores. Avoid contact with your eyes, ears, mouth and genitals (private parts). Wash Face and genitals (private parts)  with your normal soap.  6. Wash thoroughly, paying special attention to the area where your surgery will be performed.  7. Thoroughly rinse your body with warm water from the neck down.  8. DO NOT shower/wash with your normal soap after using and rinsing off the CHG Soap.  9. Pat yourself dry with a CLEAN TOWEL.  10. Wear CLEAN PAJAMAS to bed the night before surgery,  wear comfortable clothes the morning of surgery  11. Place CLEAN SHEETS on your bed the night of your first shower and DO NOT SLEEP WITH PETS.    Day of Surgery:  Do not apply any deodorants/lotions.  Please wear clean clothes to the hospital/surgery center.   Remember to brush your teeth WITH YOUR REGULAR TOOTHPASTE.    Please read over the following fact sheets that you were given. Coughing and Deep Breathing, MRSA Information and Surgical Site Infection Prevention

## 2018-06-25 ENCOUNTER — Other Ambulatory Visit (HOSPITAL_COMMUNITY): Payer: 59

## 2018-06-26 ENCOUNTER — Encounter (HOSPITAL_COMMUNITY): Payer: Self-pay

## 2018-06-26 ENCOUNTER — Other Ambulatory Visit: Payer: Self-pay

## 2018-06-26 ENCOUNTER — Encounter (HOSPITAL_COMMUNITY)
Admission: RE | Admit: 2018-06-26 | Discharge: 2018-06-26 | Disposition: A | Discharge status: Home / Self Care | Payer: 59 | Source: Ambulatory Visit | Attending: Neurosurgery | Admitting: Neurosurgery

## 2018-06-26 DIAGNOSIS — Z01818 Encounter for other preprocedural examination: Secondary | ICD-10-CM | POA: Diagnosis not present

## 2018-06-26 LAB — CBC
HCT: 50.9 % (ref 39.0–52.0)
Hemoglobin: 17.3 g/dL — ABNORMAL HIGH (ref 13.0–17.0)
MCH: 31.3 pg (ref 26.0–34.0)
MCHC: 34 g/dL (ref 30.0–36.0)
MCV: 92 fL (ref 80.0–100.0)
Platelets: 210 10*3/uL (ref 150–400)
RBC: 5.53 MIL/uL (ref 4.22–5.81)
RDW: 12.6 % (ref 11.5–15.5)
WBC: 7 10*3/uL (ref 4.0–10.5)
nRBC: 0 % (ref 0.0–0.2)

## 2018-06-26 LAB — BASIC METABOLIC PANEL
Anion gap: 5 (ref 5–15)
BUN: 14 mg/dL (ref 8–23)
CO2: 25 mmol/L (ref 22–32)
Calcium: 9.3 mg/dL (ref 8.9–10.3)
Chloride: 108 mmol/L (ref 98–111)
Creatinine, Ser: 1.33 mg/dL — ABNORMAL HIGH (ref 0.61–1.24)
GFR calc Af Amer: >60 mL/min (ref >60)
GFR calc non Af Amer: 55 mL/min — ABNORMAL LOW (ref >60)
Glucose, Bld: 86 mg/dL (ref 70–99)
Potassium: 3.9 mmol/L (ref 3.5–5.1)
Sodium: 138 mmol/L (ref 135–145)

## 2018-06-26 LAB — SURGICAL PCR SCREEN
MRSA, PCR: NEGATIVE
Staphylococcus aureus: NEGATIVE

## 2018-06-26 NOTE — Progress Notes (Signed)
[facility]  Cardiologist - Denies  Chest x-ray - Denies  EKG - 06/26/18- Irregular manual pulse   Stress Test - Denies  ECHO - Denies  Cardiac Cath - Denies  AICD- n/a PM- n/a LOOP- n/a  Sleep Study - Yes- Neg CPAP - None  LABS- 06/26/18: CBC, BMP, PCR  ASA- Denies   Anesthesia- No  Pt denies having chest pain, sob, or fever at this time. All instructions explained to the pt, with a verbal understanding of the material. Pt agrees to go over the instructions while at home for a better understanding. The opportunity to ask questions was provided.

## 2018-07-03 ENCOUNTER — Encounter (HOSPITAL_COMMUNITY): Payer: Self-pay

## 2018-07-03 ENCOUNTER — Other Ambulatory Visit: Payer: Self-pay

## 2018-07-03 ENCOUNTER — Ambulatory Visit (HOSPITAL_COMMUNITY): Payer: 59 | Admitting: Certified Registered"

## 2018-07-03 ENCOUNTER — Encounter (HOSPITAL_COMMUNITY)
Admission: RE | Disposition: A | Discharge status: Home / Self Care | Payer: Self-pay | Source: Ambulatory Visit | Attending: Neurosurgery

## 2018-07-03 ENCOUNTER — Observation Stay (HOSPITAL_COMMUNITY)
Admission: RE | Admit: 2018-07-03 | Discharge: 2018-07-04 | Disposition: A | Discharge status: Home / Self Care | Payer: 59 | Source: Ambulatory Visit | Attending: Neurosurgery | Admitting: Neurosurgery

## 2018-07-03 ENCOUNTER — Ambulatory Visit (HOSPITAL_COMMUNITY): Payer: 59

## 2018-07-03 DIAGNOSIS — Z419 Encounter for procedure for purposes other than remedying health state, unspecified: Secondary | ICD-10-CM

## 2018-07-03 DIAGNOSIS — M5117 Intervertebral disc disorders with radiculopathy, lumbosacral region: Principal | ICD-10-CM | POA: Insufficient documentation

## 2018-07-03 DIAGNOSIS — M4807 Spinal stenosis, lumbosacral region: Secondary | ICD-10-CM | POA: Diagnosis not present

## 2018-07-03 DIAGNOSIS — Z791 Long term (current) use of non-steroidal anti-inflammatories (NSAID): Secondary | ICD-10-CM | POA: Insufficient documentation

## 2018-07-03 DIAGNOSIS — M4727 Other spondylosis with radiculopathy, lumbosacral region: Secondary | ICD-10-CM | POA: Insufficient documentation

## 2018-07-03 DIAGNOSIS — Z79899 Other long term (current) drug therapy: Secondary | ICD-10-CM | POA: Insufficient documentation

## 2018-07-03 DIAGNOSIS — Z981 Arthrodesis status: Secondary | ICD-10-CM | POA: Diagnosis not present

## 2018-07-03 DIAGNOSIS — M5126 Other intervertebral disc displacement, lumbar region: Secondary | ICD-10-CM | POA: Diagnosis present

## 2018-07-03 HISTORY — PX: LUMBAR MICRODISCECTOMY: SHX99

## 2018-07-03 HISTORY — PX: LUMBAR LAMINECTOMY/DECOMPRESSION MICRODISCECTOMY: SHX5026

## 2018-07-03 SURGERY — LUMBAR LAMINECTOMY/DECOMPRESSION MICRODISCECTOMY 1 LEVEL
Anesthesia: General | Site: Back | Laterality: Left

## 2018-07-03 MED ORDER — MENTHOL 3 MG MT LOZG: 1.0000 | LOZENGE | OROMUCOSAL | Status: DC | PRN

## 2018-07-03 MED ORDER — ONDANSETRON HCL 4 MG/2ML IJ SOLN
INTRAMUSCULAR | Status: AC
  Filled 2018-07-03: qty 2

## 2018-07-03 MED ORDER — BISACODYL 10 MG RE SUPP: 10.0000 mg | Freq: Every day | RECTAL | Status: DC | PRN

## 2018-07-03 MED ORDER — ROCURONIUM BROMIDE 10 MG/ML (PF) SYRINGE
PREFILLED_SYRINGE | INTRAVENOUS | Status: DC | PRN
  Administered 2018-07-03: 50 mg via INTRAVENOUS

## 2018-07-03 MED ORDER — KCL IN DEXTROSE-NACL 20-5-0.45 MEQ/L-%-% IV SOLN
INTRAVENOUS | Status: DC
  Administered 2018-07-03 – 2018-07-04 (×2): via INTRAVENOUS
  Filled 2018-07-03 (×2): qty 1000

## 2018-07-03 MED ORDER — SUFENTANIL CITRATE 50 MCG/ML IV SOLN
INTRAVENOUS | Status: DC | PRN
  Administered 2018-07-03: 20 ug via INTRAVENOUS
  Administered 2018-07-03: 5 ug via INTRAVENOUS

## 2018-07-03 MED ORDER — PANTOPRAZOLE SODIUM 40 MG IV SOLR
40.0000 mg | Freq: Every day | INTRAVENOUS | Status: DC
  Administered 2018-07-03: 40 mg via INTRAVENOUS
  Filled 2018-07-03: qty 40

## 2018-07-03 MED ORDER — CHLORHEXIDINE GLUCONATE CLOTH 2 % EX PADS: 6.0000 | MEDICATED_PAD | Freq: Once | CUTANEOUS | Status: DC

## 2018-07-03 MED ORDER — FENTANYL CITRATE (PF) 100 MCG/2ML IJ SOLN
INTRAMUSCULAR | Status: DC | PRN
  Administered 2018-07-03: 100 ug via INTRAVENOUS

## 2018-07-03 MED ORDER — SCOPOLAMINE 1 MG/3DAYS TD PT72
1.0000 | MEDICATED_PATCH | TRANSDERMAL | Status: DC
  Administered 2018-07-03: 1.5 mg via TRANSDERMAL
  Filled 2018-07-03: qty 1

## 2018-07-03 MED ORDER — ACETAMINOPHEN 650 MG RE SUPP: 650.0000 mg | RECTAL | Status: DC | PRN

## 2018-07-03 MED ORDER — ONDANSETRON HCL 4 MG PO TABS: 4.0000 mg | ORAL_TABLET | Freq: Four times a day (QID) | ORAL | Status: DC | PRN

## 2018-07-03 MED ORDER — SODIUM CHLORIDE 0.9% FLUSH: 3.0000 mL | INTRAVENOUS | Status: DC | PRN

## 2018-07-03 MED ORDER — BUPIVACAINE HCL (PF) 0.5 % IJ SOLN
INTRAMUSCULAR | Status: DC | PRN
  Administered 2018-07-03: 5 mL

## 2018-07-03 MED ORDER — FENTANYL CITRATE (PF) 100 MCG/2ML IJ SOLN
INTRAMUSCULAR | Status: AC
  Filled 2018-07-03: qty 2

## 2018-07-03 MED ORDER — DEXAMETHASONE SODIUM PHOSPHATE 10 MG/ML IJ SOLN
INTRAMUSCULAR | Status: DC | PRN
  Administered 2018-07-03: 10 mg via INTRAVENOUS

## 2018-07-03 MED ORDER — SODIUM CHLORIDE 0.9 % IJ SOLN
INTRAMUSCULAR | Status: AC
  Filled 2018-07-03: qty 10

## 2018-07-03 MED ORDER — LIDOCAINE-EPINEPHRINE 1 %-1:100000 IJ SOLN
INTRAMUSCULAR | Status: DC | PRN
  Administered 2018-07-03: 5 mL

## 2018-07-03 MED ORDER — MELOXICAM 7.5 MG PO TABS
7.5000 mg | ORAL_TABLET | Freq: Two times a day (BID) | ORAL | Status: DC
  Administered 2018-07-03 – 2018-07-04 (×2): 7.5 mg via ORAL
  Filled 2018-07-03 (×2): qty 1

## 2018-07-03 MED ORDER — METHYLPREDNISOLONE ACETATE 80 MG/ML IJ SUSP
INTRAMUSCULAR | Status: AC
  Filled 2018-07-03: qty 1

## 2018-07-03 MED ORDER — ZOLPIDEM TARTRATE 5 MG PO TABS: 5.0000 mg | ORAL_TABLET | Freq: Every evening | ORAL | Status: DC | PRN

## 2018-07-03 MED ORDER — THROMBIN 5000 UNITS EX SOLR
OROMUCOSAL | Status: DC | PRN
  Administered 2018-07-03: 16:00:00 via TOPICAL

## 2018-07-03 MED ORDER — SODIUM CHLORIDE 0.9% FLUSH
3.0000 mL | Freq: Two times a day (BID) | INTRAVENOUS | Status: DC
  Administered 2018-07-03 – 2018-07-04 (×2): 3 mL via INTRAVENOUS

## 2018-07-03 MED ORDER — METHOCARBAMOL 1000 MG/10ML IJ SOLN
500.0000 mg | Freq: Four times a day (QID) | INTRAVENOUS | Status: DC | PRN
  Filled 2018-07-03: qty 5

## 2018-07-03 MED ORDER — PHENYLEPHRINE 40 MCG/ML (10ML) SYRINGE FOR IV PUSH (FOR BLOOD PRESSURE SUPPORT)
PREFILLED_SYRINGE | INTRAVENOUS | Status: DC | PRN
  Administered 2018-07-03 (×3): 80 ug via INTRAVENOUS

## 2018-07-03 MED ORDER — ACETAMINOPHEN 325 MG PO TABS: 650.0000 mg | ORAL_TABLET | ORAL | Status: DC | PRN

## 2018-07-03 MED ORDER — PROPOFOL 10 MG/ML IV BOLUS
INTRAVENOUS | Status: DC | PRN
  Administered 2018-07-03: 200 mg via INTRAVENOUS

## 2018-07-03 MED ORDER — MORPHINE SULFATE (PF) 2 MG/ML IV SOLN: 2.0000 mg | INTRAVENOUS | Status: DC | PRN

## 2018-07-03 MED ORDER — PROMETHAZINE HCL 25 MG/ML IJ SOLN: 6.2500 mg | INTRAMUSCULAR | Status: DC | PRN

## 2018-07-03 MED ORDER — ALUM & MAG HYDROXIDE-SIMETH 200-200-20 MG/5ML PO SUSP: 30.0000 mL | Freq: Four times a day (QID) | ORAL | Status: DC | PRN

## 2018-07-03 MED ORDER — ONDANSETRON HCL 4 MG/2ML IJ SOLN: 4.0000 mg | Freq: Four times a day (QID) | INTRAMUSCULAR | Status: DC | PRN

## 2018-07-03 MED ORDER — 0.9 % SODIUM CHLORIDE (POUR BTL) OPTIME
TOPICAL | Status: DC | PRN
  Administered 2018-07-03: 1000 mL

## 2018-07-03 MED ORDER — SODIUM CHLORIDE 0.9 % IV SOLN
250.0000 mL | INTRAVENOUS | Status: DC
  Administered 2018-07-03: 250 mL via INTRAVENOUS

## 2018-07-03 MED ORDER — PROPOFOL 10 MG/ML IV BOLUS
INTRAVENOUS | Status: AC
  Filled 2018-07-03: qty 20

## 2018-07-03 MED ORDER — LIDOCAINE 2% (20 MG/ML) 5 ML SYRINGE
INTRAMUSCULAR | Status: AC
  Filled 2018-07-03: qty 5

## 2018-07-03 MED ORDER — OXYCODONE HCL 5 MG PO TABS
ORAL_TABLET | ORAL | Status: AC
  Administered 2018-07-03: 5 mg
  Filled 2018-07-03: qty 1

## 2018-07-03 MED ORDER — LIDOCAINE 2% (20 MG/ML) 5 ML SYRINGE
INTRAMUSCULAR | Status: DC | PRN
  Administered 2018-07-03: 100 mg via INTRAVENOUS

## 2018-07-03 MED ORDER — LIDOCAINE-EPINEPHRINE 1 %-1:100000 IJ SOLN
INTRAMUSCULAR | Status: AC
  Filled 2018-07-03: qty 1

## 2018-07-03 MED ORDER — THROMBIN 5000 UNITS EX SOLR
CUTANEOUS | Status: AC
  Filled 2018-07-03: qty 5000

## 2018-07-03 MED ORDER — METHOCARBAMOL 500 MG PO TABS: 500.0000 mg | ORAL_TABLET | Freq: Four times a day (QID) | ORAL | Status: DC | PRN

## 2018-07-03 MED ORDER — DOCUSATE SODIUM 100 MG PO CAPS
100.0000 mg | ORAL_CAPSULE | Freq: Two times a day (BID) | ORAL | Status: DC
  Administered 2018-07-03 – 2018-07-04 (×2): 100 mg via ORAL
  Filled 2018-07-03 (×2): qty 1

## 2018-07-03 MED ORDER — PHENYLEPHRINE 40 MCG/ML (10ML) SYRINGE FOR IV PUSH (FOR BLOOD PRESSURE SUPPORT)
PREFILLED_SYRINGE | INTRAVENOUS | Status: AC
  Filled 2018-07-03: qty 10

## 2018-07-03 MED ORDER — FLEET ENEMA 7-19 GM/118ML RE ENEM: 1.0000 | ENEMA | Freq: Once | RECTAL | Status: DC | PRN

## 2018-07-03 MED ORDER — HYDROCODONE-ACETAMINOPHEN 5-325 MG PO TABS: 2.0000 | ORAL_TABLET | ORAL | Status: DC | PRN

## 2018-07-03 MED ORDER — HYDROMORPHONE HCL 1 MG/ML IJ SOLN: 0.2500 mg | INTRAMUSCULAR | Status: DC | PRN

## 2018-07-03 MED ORDER — PHENOL 1.4 % MT LIQD: 1.0000 | OROMUCOSAL | Status: DC | PRN

## 2018-07-03 MED ORDER — OXYCODONE HCL 5 MG PO TABS
5.0000 mg | ORAL_TABLET | ORAL | Status: DC | PRN
  Administered 2018-07-04 (×2): 5 mg via ORAL
  Filled 2018-07-03 (×3): qty 1

## 2018-07-03 MED ORDER — CEFAZOLIN SODIUM-DEXTROSE 2-4 GM/100ML-% IV SOLN
2.0000 g | INTRAVENOUS | Status: AC
  Administered 2018-07-03: 2 g via INTRAVENOUS
  Filled 2018-07-03: qty 100

## 2018-07-03 MED ORDER — CYCLOBENZAPRINE HCL 10 MG PO TABS
5.0000 mg | ORAL_TABLET | Freq: Three times a day (TID) | ORAL | Status: DC | PRN
  Administered 2018-07-04: 5 mg via ORAL
  Filled 2018-07-03: qty 1

## 2018-07-03 MED ORDER — METHYLPREDNISOLONE ACETATE 80 MG/ML IJ SUSP
INTRAMUSCULAR | Status: DC | PRN
  Administered 2018-07-03: 80 mg

## 2018-07-03 MED ORDER — BUPIVACAINE HCL (PF) 0.5 % IJ SOLN
INTRAMUSCULAR | Status: AC
  Filled 2018-07-03: qty 30

## 2018-07-03 MED ORDER — ONDANSETRON HCL 4 MG/2ML IJ SOLN
INTRAMUSCULAR | Status: DC | PRN
  Administered 2018-07-03: 4 mg via INTRAVENOUS

## 2018-07-03 MED ORDER — MIDAZOLAM HCL 2 MG/2ML IJ SOLN
INTRAMUSCULAR | Status: AC
  Filled 2018-07-03: qty 2

## 2018-07-03 MED ORDER — ROCURONIUM BROMIDE 50 MG/5ML IV SOSY
PREFILLED_SYRINGE | INTRAVENOUS | Status: AC
  Filled 2018-07-03: qty 5

## 2018-07-03 MED ORDER — MIDAZOLAM HCL 5 MG/5ML IJ SOLN
INTRAMUSCULAR | Status: DC | PRN
  Administered 2018-07-03: 2 mg via INTRAVENOUS

## 2018-07-03 MED ORDER — POLYETHYLENE GLYCOL 3350 17 G PO PACK: 17.0000 g | PACK | Freq: Every day | ORAL | Status: DC | PRN

## 2018-07-03 MED ORDER — LACTATED RINGERS IV SOLN
INTRAVENOUS | Status: DC | PRN
  Administered 2018-07-03 (×2): via INTRAVENOUS

## 2018-07-03 MED ORDER — CEFAZOLIN SODIUM-DEXTROSE 2-4 GM/100ML-% IV SOLN
2.0000 g | Freq: Three times a day (TID) | INTRAVENOUS | Status: AC
  Administered 2018-07-03 – 2018-07-04 (×2): 2 g via INTRAVENOUS
  Filled 2018-07-03 (×2): qty 100

## 2018-07-03 MED ORDER — DEXAMETHASONE SODIUM PHOSPHATE 10 MG/ML IJ SOLN
INTRAMUSCULAR | Status: AC
  Filled 2018-07-03: qty 1

## 2018-07-03 MED ORDER — LACTATED RINGERS IV SOLN
Freq: Once | INTRAVENOUS | Status: AC
  Administered 2018-07-03: 13:00:00 via INTRAVENOUS

## 2018-07-03 MED ORDER — SUFENTANIL CITRATE 50 MCG/ML IV SOLN
INTRAVENOUS | Status: AC
  Filled 2018-07-03: qty 1

## 2018-07-03 SURGICAL SUPPLY — 57 items
BLADE CLIPPER SURG (BLADE) ×2 IMPLANT
BUR MATCHSTICK NEURO 3.0 LAGG (BURR) ×2 IMPLANT
BUR ROUND FLUTED 5 RND (BURR) ×2 IMPLANT
CANISTER SUCT 3000ML PPV (MISCELLANEOUS) ×2 IMPLANT
CARTRIDGE OIL MAESTRO DRILL (MISCELLANEOUS) ×1 IMPLANT
COVER WAND RF STERILE (DRAPES) ×2 IMPLANT
DECANTER SPIKE VIAL GLASS SM (MISCELLANEOUS) ×2 IMPLANT
DERMABOND ADVANCED (GAUZE/BANDAGES/DRESSINGS) ×1
DERMABOND ADVANCED .7 DNX12 (GAUZE/BANDAGES/DRESSINGS) ×1 IMPLANT
DIFFUSER DRILL AIR PNEUMATIC (MISCELLANEOUS) ×2 IMPLANT
DRAPE LAPAROTOMY 100X72X124 (DRAPES) ×2 IMPLANT
DRAPE MICROSCOPE LEICA (MISCELLANEOUS) ×2 IMPLANT
DRAPE SURG 17X23 STRL (DRAPES) ×2 IMPLANT
DRSG OPSITE POSTOP 3X4 (GAUZE/BANDAGES/DRESSINGS) ×2 IMPLANT
DURAPREP 26ML APPLICATOR (WOUND CARE) ×2 IMPLANT
ELECT BLADE 4.0 EZ CLEAN MEGAD (MISCELLANEOUS) ×2
ELECT REM PT RETURN 9FT ADLT (ELECTROSURGICAL) ×2
ELECTRODE BLDE 4.0 EZ CLN MEGD (MISCELLANEOUS) ×1 IMPLANT
ELECTRODE REM PT RTRN 9FT ADLT (ELECTROSURGICAL) ×1 IMPLANT
GAUZE 4X4 16PLY RFD (DISPOSABLE) IMPLANT
GAUZE SPONGE 4X4 12PLY STRL (GAUZE/BANDAGES/DRESSINGS) IMPLANT
GLOVE BIO SURGEON STRL SZ8 (GLOVE) ×2 IMPLANT
GLOVE BIOGEL PI IND STRL 7.5 (GLOVE) ×2 IMPLANT
GLOVE BIOGEL PI IND STRL 8 (GLOVE) ×1 IMPLANT
GLOVE BIOGEL PI IND STRL 8.5 (GLOVE) ×1 IMPLANT
GLOVE BIOGEL PI INDICATOR 7.5 (GLOVE) ×2
GLOVE BIOGEL PI INDICATOR 8 (GLOVE) ×1
GLOVE BIOGEL PI INDICATOR 8.5 (GLOVE) ×1
GLOVE ECLIPSE 6.5 STRL STRAW (GLOVE) ×2 IMPLANT
GLOVE ECLIPSE 8.0 STRL XLNG CF (GLOVE) ×2 IMPLANT
GLOVE EXAM NITRILE XL STR (GLOVE) IMPLANT
GLOVE SURG SS PI 7.0 STRL IVOR (GLOVE) ×6 IMPLANT
GOWN STRL REUS W/ TWL LRG LVL3 (GOWN DISPOSABLE) ×2 IMPLANT
GOWN STRL REUS W/ TWL XL LVL3 (GOWN DISPOSABLE) ×1 IMPLANT
GOWN STRL REUS W/TWL 2XL LVL3 (GOWN DISPOSABLE) ×2 IMPLANT
GOWN STRL REUS W/TWL LRG LVL3 (GOWN DISPOSABLE) ×2
GOWN STRL REUS W/TWL XL LVL3 (GOWN DISPOSABLE) ×1
HEMOSTAT POWDER KIT SURGIFOAM (HEMOSTASIS) ×2 IMPLANT
KIT BASIN OR (CUSTOM PROCEDURE TRAY) ×2 IMPLANT
KIT TURNOVER KIT B (KITS) ×2 IMPLANT
NEEDLE HYPO 18GX1.5 BLUNT FILL (NEEDLE) ×2 IMPLANT
NEEDLE HYPO 25X1 1.5 SAFETY (NEEDLE) ×2 IMPLANT
NEEDLE SPNL 18GX3.5 QUINCKE PK (NEEDLE) ×2 IMPLANT
NS IRRIG 1000ML POUR BTL (IV SOLUTION) ×2 IMPLANT
OIL CARTRIDGE MAESTRO DRILL (MISCELLANEOUS) ×2
PACK LAMINECTOMY NEURO (CUSTOM PROCEDURE TRAY) ×2 IMPLANT
PAD ARMBOARD 7.5X6 YLW CONV (MISCELLANEOUS) ×6 IMPLANT
RUBBERBAND STERILE (MISCELLANEOUS) ×4 IMPLANT
SPONGE SURGIFOAM ABS GEL SZ50 (HEMOSTASIS) IMPLANT
SUT VIC AB 0 CT1 18XCR BRD8 (SUTURE) ×1 IMPLANT
SUT VIC AB 0 CT1 8-18 (SUTURE) ×1
SUT VIC AB 2-0 CT1 18 (SUTURE) ×2 IMPLANT
SUT VIC AB 3-0 SH 8-18 (SUTURE) ×2 IMPLANT
SYR 5ML LL (SYRINGE) ×2 IMPLANT
TOWEL GREEN STERILE (TOWEL DISPOSABLE) ×2 IMPLANT
TOWEL GREEN STERILE FF (TOWEL DISPOSABLE) ×2 IMPLANT
WATER STERILE IRR 1000ML POUR (IV SOLUTION) ×2 IMPLANT

## 2018-07-03 NOTE — Anesthesia Preprocedure Evaluation (Signed)
Anesthesia Evaluation  Patient identified by MRN, date of birth, ID band Patient awake    Reviewed: Allergy & Precautions, NPO status , Patient's Chart, lab work & pertinent test results  Airway Mallampati: II  TM Distance: >3 FB Neck ROM: Full    Dental no notable dental hx. (+) Dental Advisory Given   Pulmonary neg pulmonary ROS,    Pulmonary exam normal        Cardiovascular negative cardio ROS Normal cardiovascular exam     Neuro/Psych PSYCHIATRIC DISORDERS Depression negative neurological ROS  negative psych ROS   GI/Hepatic negative GI ROS, Neg liver ROS,   Endo/Other  negative endocrine ROS  Renal/GU negative Renal ROS  negative genitourinary   Musculoskeletal negative musculoskeletal ROS (+)   Abdominal   Peds negative pediatric ROS (+)  Hematology negative hematology ROS (+)   Anesthesia Other Findings   Reproductive/Obstetrics negative OB ROS                             Anesthesia Physical Anesthesia Plan  ASA: II  Anesthesia Plan: General   Post-op Pain Management:    Induction: Intravenous  PONV Risk Score and Plan: 2 and Ondansetron and Dexamethasone  Airway Management Planned: Oral ETT  Additional Equipment:   Intra-op Plan:   Post-operative Plan: Extubation in OR  Informed Consent: I have reviewed the patients History and Physical, chart, labs and discussed the procedure including the risks, benefits and alternatives for the proposed anesthesia with the patient or authorized representative who has indicated his/her understanding and acceptance.   Dental advisory given  Plan Discussed with: CRNA and Anesthesiologist  Anesthesia Plan Comments:         Anesthesia Quick Evaluation

## 2018-07-03 NOTE — Op Note (Signed)
07/03/2018  4:48 PM  PATIENT:  Shane Kerr  62 y.o. male  PRE-OPERATIVE DIAGNOSIS:  Ruptured lumbar disc L 5 S 1 with spondylosis, DDD, radiculopathy  POST-OPERATIVE DIAGNOSIS:  Ruptured lumbar disc L 5 S 1 with spondylosis, DDD, radiculopathy  PROCEDURE:  Procedure(s): Left Lumbar five Sacral one Microdiscectomy (Left) with microdissection  SURGEON:  Surgeon(s) and Role:    Maeola Harman, MD - Primary    * Coletta Memos, MD - Assisting  PHYSICIAN ASSISTANT:   ASSISTANTS: Poteat, RN   ANESTHESIA:   general  EBL:  Minimal  BLOOD ADMINISTERED:none  DRAINS: none   LOCAL MEDICATIONS USED:  MARCAINE    and LIDOCAINE   SPECIMEN:  No Specimen  DISPOSITION OF SPECIMEN:  N/A  COUNTS:  YES  TOURNIQUET:  * No tourniquets in log *  DICTATION: Patient has a large L 5 S 1 disc rupture on the left with significant left leg weakness. It was elected to take him to surgery for left L 5 S 1 microdiscectomy.  Procedure: Patient was brought to the operating room and following the smooth and uncomplicated induction of general endotracheal anesthesia he was placed in a prone position on the Wilson frame. Low back was prepped and draped in the usual sterile fashion with betadine scrub and DuraPrep. Preoperative localizing X ray was obtained with a spinal needle.  Area of planned incision was infiltrated with local lidocaine. Incision was made in the midline and carried to the lumbodorsal fascia which was incised on the left side of midline. Subperiosteal dissection was performed exposing what was felt to be L 5 S 1 level. Intraoperative x-ray demonstrated marker probe at L 5 S 1.  The microscope was brought into the field and a hemi-semi-laminectomy of L 5 was performed a high-speed drill and completed with Kerrison rongeurs and a generous foraminotomy was performed overlying the superior aspect of the S 1 lamina. There was significant spondylosis and facetal overgrowth which was removed to  decompress the lateral thecal sac and S 1 nerve root.  Ligamentum flavum was detached and removed in a piecemeal fashion and the S 1 nerve root was decompressed laterally with removal of the superior aspect of the facet and ligamentum causing nerve root compression.  and the S 1 nerve root was mobilized medially. This exposed disc material and a superiorly migrated free fragment of herniated disc material. Multiple fragments were removed and these extended into the interspace which appeared to be quite soft with a disrupted annulus overlying the interspace. As a result it was elected to further decompress the interspace and remove loose disc material and this was done with a variety of pituitary rongeurs. The redundant annulus was also removed with 2 mm Kerrison rongeur. At this point it was felt that all neural elements were well decompressed and there was no evidence of residual loose disc material within the interspace. The interspace was then irrigated with saline and no additional disc material was mobilized. Hemostasis was assured with bipolar electrocautery and the interspace was irrigated with Depo-Medrol and fentanyl. The lumbodorsal fascia was closed with 0 Vicryl sutures the subcutaneous tissues reapproximated 2-0 Vicryl inverted sutures and the skin edges were reapproximated with 3-0 Vicryl subcuticular stitch. The wound is dressed with Dermabond and an occlusive dressing. Patient was extubated in the operating room and taken to recovery in stable and satisfactory condition having tolerated his operation well counts were correct at the end of the case.   PLAN OF CARE: Admit for  overnight observation  PATIENT DISPOSITION:  PACU - hemodynamically stable.   Delay start of Pharmacological VTE agent (>24hrs) due to surgical blood loss or risk of bleeding: yes

## 2018-07-03 NOTE — Anesthesia Procedure Notes (Signed)
Procedure Name: Intubation Date/Time: 07/03/2018 3:04 PM Performed by: Moshe Salisbury, CRNA Pre-anesthesia Checklist: Patient identified, Emergency Drugs available, Suction available and Patient being monitored Patient Re-evaluated:Patient Re-evaluated prior to induction Oxygen Delivery Method: Circle System Utilized Preoxygenation: Pre-oxygenation with 100% oxygen Induction Type: IV induction Ventilation: Mask ventilation without difficulty Laryngoscope Size: Mac and 4 Grade View: Grade II Tube type: Oral Tube size: 8.0 mm Number of attempts: 2 Airway Equipment and Method: Stylet Placement Confirmation: ETT inserted through vocal cords under direct vision,  positive ETCO2 and breath sounds checked- equal and bilateral Secured at: 24 cm Tube secured with: Tape Dental Injury: Teeth and Oropharynx as per pre-operative assessment

## 2018-07-03 NOTE — Transfer of Care (Signed)
Immediate Anesthesia Transfer of Care Note  Patient: Shane Kerr  Procedure(s) Performed: Left Lumbar five Sacral one Microdiscectomy (Left Back)  Patient Location: PACU  Anesthesia Type:General  Level of Consciousness: drowsy and patient cooperative  Airway & Oxygen Therapy: Patient Spontanous Breathing and Patient connected to nasal cannula oxygen  Post-op Assessment: Report given to RN, Post -op Vital signs reviewed and stable and Patient moving all extremities  Post vital signs: Reviewed and stable  Last Vitals:  Vitals Value Taken Time  BP 135/97 07/03/2018  4:54 PM  Temp 36.5 C 07/03/2018  4:54 PM  Pulse 93 07/03/2018  4:54 PM  Resp 8 07/03/2018  4:54 PM  SpO2 97 % 07/03/2018  4:54 PM  Vitals shown include unvalidated device data.  Last Pain:  Vitals:   07/03/18 1320  TempSrc:   PainSc: 0-No pain      Patients Stated Pain Goal: 2 (07/03/18 1320)  Complications: No apparent anesthesia complications

## 2018-07-03 NOTE — Brief Op Note (Signed)
07/03/2018  4:48 PM  PATIENT:  Shane Kerr  61 y.o. male  PRE-OPERATIVE DIAGNOSIS:  Ruptured lumbar disc L 5 S 1 with spondylosis, DDD, radiculopathy  POST-OPERATIVE DIAGNOSIS:  Ruptured lumbar disc L 5 S 1 with spondylosis, DDD, radiculopathy  PROCEDURE:  Procedure(s): Left Lumbar five Sacral one Microdiscectomy (Left) with microdissection  SURGEON:  Surgeon(s) and Role:    * Shreyansh Tiffany, MD - Primary    * Cabbell, Kyle, MD - Assisting  PHYSICIAN ASSISTANT:   ASSISTANTS: Poteat, RN   ANESTHESIA:   general  EBL:  Minimal  BLOOD ADMINISTERED:none  DRAINS: none   LOCAL MEDICATIONS USED:  MARCAINE    and LIDOCAINE   SPECIMEN:  No Specimen  DISPOSITION OF SPECIMEN:  N/A  COUNTS:  YES  TOURNIQUET:  * No tourniquets in log *  DICTATION: Patient has a large L 5 S 1 disc rupture on the left with significant left leg weakness. It was elected to take him to surgery for left L 5 S 1 microdiscectomy.  Procedure: Patient was brought to the operating room and following the smooth and uncomplicated induction of general endotracheal anesthesia he was placed in a prone position on the Wilson frame. Low back was prepped and draped in the usual sterile fashion with betadine scrub and DuraPrep. Preoperative localizing X ray was obtained with a spinal needle.  Area of planned incision was infiltrated with local lidocaine. Incision was made in the midline and carried to the lumbodorsal fascia which was incised on the left side of midline. Subperiosteal dissection was performed exposing what was felt to be L 5 S 1 level. Intraoperative x-ray demonstrated marker probe at L 5 S 1.  The microscope was brought into the field and a hemi-semi-laminectomy of L 5 was performed a high-speed drill and completed with Kerrison rongeurs and a generous foraminotomy was performed overlying the superior aspect of the S 1 lamina. There was significant spondylosis and facetal overgrowth which was removed to  decompress the lateral thecal sac and S 1 nerve root.  Ligamentum flavum was detached and removed in a piecemeal fashion and the S 1 nerve root was decompressed laterally with removal of the superior aspect of the facet and ligamentum causing nerve root compression.  and the S 1 nerve root was mobilized medially. This exposed disc material and a superiorly migrated free fragment of herniated disc material. Multiple fragments were removed and these extended into the interspace which appeared to be quite soft with a disrupted annulus overlying the interspace. As a result it was elected to further decompress the interspace and remove loose disc material and this was done with a variety of pituitary rongeurs. The redundant annulus was also removed with 2 mm Kerrison rongeur. At this point it was felt that all neural elements were well decompressed and there was no evidence of residual loose disc material within the interspace. The interspace was then irrigated with saline and no additional disc material was mobilized. Hemostasis was assured with bipolar electrocautery and the interspace was irrigated with Depo-Medrol and fentanyl. The lumbodorsal fascia was closed with 0 Vicryl sutures the subcutaneous tissues reapproximated 2-0 Vicryl inverted sutures and the skin edges were reapproximated with 3-0 Vicryl subcuticular stitch. The wound is dressed with Dermabond and an occlusive dressing. Patient was extubated in the operating room and taken to recovery in stable and satisfactory condition having tolerated his operation well counts were correct at the end of the case.   PLAN OF CARE: Admit for   overnight observation  PATIENT DISPOSITION:  PACU - hemodynamically stable.   Delay start of Pharmacological VTE agent (>24hrs) due to surgical blood loss or risk of bleeding: yes  

## 2018-07-03 NOTE — Interval H&P Note (Signed)
History and Physical Interval Note:  07/03/2018 2:14 PM  Shane Kerr  has presented today for surgery, with the diagnosis of Ruptured lumbar disc  The various methods of treatment have been discussed with the patient and family. After consideration of risks, benefits and other options for treatment, the patient has consented to  Procedure(s) with comments: Left Lumbar 5 Sacral 1 Microdiscectomy (Left) - Left Lumbar 5 Sacral 1 Microdiscectomy as a surgical intervention .  The patient's history has been reviewed, patient examined, no change in status, stable for surgery.  I have reviewed the patient's chart and labs.  Questions were answered to the patient's satisfaction.     Dorian Heckle

## 2018-07-03 NOTE — Anesthesia Postprocedure Evaluation (Signed)
Anesthesia Post Note  Patient: Shane Kerr  Procedure(s) Performed: Left Lumbar five Sacral one Microdiscectomy (Left Back)     Patient location during evaluation: PACU Anesthesia Type: General Level of consciousness: sedated Pain management: pain level controlled Vital Signs Assessment: post-procedure vital signs reviewed and stable Respiratory status: spontaneous breathing and respiratory function stable Cardiovascular status: stable Postop Assessment: no apparent nausea or vomiting Anesthetic complications: no    Last Vitals:  Vitals:   07/03/18 1708 07/03/18 1724  BP: 131/79 124/80  Pulse: 91 86  Resp: 12   Temp:    SpO2: 93% 96%    Last Pain:  Vitals:   07/03/18 1720  TempSrc:   PainSc: 0-No pain                 Nikol Lemar DANIEL

## 2018-07-03 NOTE — Progress Notes (Signed)
Report given, nurse request waiting until 645.

## 2018-07-03 NOTE — Progress Notes (Signed)
Awake, alert, conversant.  MAEW with full power.  Denies numbness.  Says leg "feels much better."  Doing well.

## 2018-07-03 NOTE — Progress Notes (Signed)
Patient received, VSS, NAD noted. Agree w/previous assessment as documented.

## 2018-07-04 ENCOUNTER — Encounter (HOSPITAL_COMMUNITY): Payer: Self-pay | Admitting: Neurosurgery

## 2018-07-04 DIAGNOSIS — Z791 Long term (current) use of non-steroidal anti-inflammatories (NSAID): Secondary | ICD-10-CM | POA: Diagnosis not present

## 2018-07-04 DIAGNOSIS — M5117 Intervertebral disc disorders with radiculopathy, lumbosacral region: Secondary | ICD-10-CM | POA: Diagnosis not present

## 2018-07-04 DIAGNOSIS — M5126 Other intervertebral disc displacement, lumbar region: Secondary | ICD-10-CM | POA: Diagnosis not present

## 2018-07-04 DIAGNOSIS — M4727 Other spondylosis with radiculopathy, lumbosacral region: Secondary | ICD-10-CM | POA: Diagnosis not present

## 2018-07-04 DIAGNOSIS — Z79899 Other long term (current) drug therapy: Secondary | ICD-10-CM | POA: Diagnosis not present

## 2018-07-04 DIAGNOSIS — M4807 Spinal stenosis, lumbosacral region: Secondary | ICD-10-CM | POA: Diagnosis not present

## 2018-07-04 MED ORDER — OXYCODONE HCL 5 MG PO TABS: 5.0000 mg | ORAL_TABLET | ORAL | 0 refills | Status: DC | PRN

## 2018-07-04 MED ORDER — METHOCARBAMOL 500 MG PO TABS: 500.0000 mg | ORAL_TABLET | Freq: Four times a day (QID) | ORAL | 1 refills | Status: DC | PRN

## 2018-07-04 NOTE — Evaluation (Signed)
Physical Therapy Evaluation Patient Details Name: Shane Kerr MRN: [id number] DOB: 03-08-1956 Today's Date: 07/04/2018   History of Present Illness  Pt is a 62 y.o male with a history of LBP and bilateral leg numbness, with the LLE worse than the R caused from a ruptured lumbar disc of L5/S1. Pt is now sp L lumbar 5 Sacral 1 microdiscectomy performed on 07/03/2018.    Clinical Impression   Pt presented supine, HOB elevated, and awake. Pt was willing and ready to participate in PT. Pt performed bed mobility with min guard A  with verbal cues given to maintain back precautions and use of bed rail. Pt experienced initial bout of dizziness during sitting that went away after standing. Pt was overall min guard for transfers, ambulation and stairs. Pt also walked 8 steps using HHA from end of bed to chair following ambulation. Pt would benefit from skilled PT while admitted in order to increase functional abilities and strength.       Follow Up Recommendations No PT follow up    Equipment Recommendations  Rolling walker with 5" wheels    Recommendations for Other Services       Precautions / Restrictions Precautions Precautions: Back;Fall Precaution Booklet Issued: No Precaution Comments: pt demonstrated understadning of no bending, liifting or twisting. Restrictions Weight Bearing Restrictions: No      Mobility  Bed Mobility Overal bed mobility: Needs Assistance Bed Mobility: Rolling;Sidelying to Sit Rolling: Min guard Sidelying to sit: Min guard       General bed mobility comments: Verbal cueing given to initiate log roll procedure. Pt able to complete log roll without breaking precautions using the bed rail.  Transfers Overall transfer level: Needs assistance Equipment used: Rolling walker (2 wheeled) Transfers: Sit to/from Stand Sit to Stand: Min assist;Min guard         General transfer comment: Pt attempted two sit to stands. First required min assist. Second  attempt min guard used and pt reported "felt easier"  Ambulation/Gait Ambulation/Gait assistance: Min guard Gait Distance (Feet): 175 Feet Assistive device: Rolling walker (2 wheeled);1 person hand held assist Gait Pattern/deviations: Decreased step length - left;Step-through pattern;Decreased step length - right;Decreased stance time - left     General Gait Details: Pt instructed on correct use of rolling walker. Pt demonstrated decreased stance time on LLE secondary to pain. Pt took 8 steps with HHA on the left with no increased loss of balance or sway  Stairs Stairs: Yes Stairs assistance: Min guard Stair Management: Two rails;Step to pattern Number of Stairs: 2 General stair comments: Pt ascended steps leading with RLE and descended leading with LLE. Pt demonstrated decreased speed during stairs, with pt agreeing that "he was moving slower"  Wheelchair Mobility    Modified Rankin (Stroke Patients Only)       Balance Overall balance assessment: Needs assistance Sitting-balance support: Feet supported Sitting balance-Leahy Scale: Good Sitting balance - Comments: Pt reported increased dizziness after initiating sitting. Dizziness resolved once standing   Standing balance support: Bilateral upper extremity supported Standing balance-Leahy Scale: Fair Standing balance comment: Pt relied on UE support during standing initially. Decreased reliance of UE support at end of session                             Pertinent Vitals/Pain Pain Assessment: Faces Faces Pain Scale: Hurts even more Pain Location: back/ LLE Pain Descriptors / Indicators: Nagging;Aching;Jabbing Pain Intervention(s): Monitored during session  Home Living Family/patient expects to be discharged to:: Private residence Living Arrangements: Spouse/significant other Available Help at Discharge: Family;Available 24 hours/day Type of Home: House Home Access: Stairs to enter Entrance Stairs-Rails:  Can reach both Entrance Stairs-Number of Steps: 2 Home Layout: One level Home Equipment: Walker - standard;Cane - single point Additional Comments: Dan Humphreys is in possesion of his mother     Prior Function Level of Independence: Independent         Comments: Prior to surgery during intense pain bouts, pt used cane with increased rest breaks.     Hand Dominance   Dominant Hand: Right    Extremity/Trunk Assessment   Upper Extremity Assessment Upper Extremity Assessment: Overall WFL for tasks assessed    Lower Extremity Assessment Lower Extremity Assessment: Overall WFL for tasks assessed    Cervical / Trunk Assessment Cervical / Trunk Assessment: Normal  Communication   Communication: No difficulties  Cognition Arousal/Alertness: Awake/alert Behavior During Therapy: WFL for tasks assessed/performed Overall Cognitive Status: Within Functional Limits for tasks assessed                                        General Comments General comments (skin integrity, edema, etc.): clean, dry dressing     Exercises     Assessment/Plan    PT Assessment Patient needs continued PT services  PT Problem List Decreased strength;Decreased activity tolerance;Decreased balance;Decreased range of motion;Decreased mobility;Decreased coordination;Decreased knowledge of use of DME;Decreased knowledge of precautions       PT Treatment Interventions Gait training;DME instruction;Stair training;Functional mobility training;Therapeutic activities;Therapeutic exercise;Balance training;Neuromuscular re-education    PT Goals (Current goals can be found in the Care Plan section)  Acute Rehab PT Goals Patient Stated Goal: To return home and back to normal PT Goal Formulation: With patient/family Time For Goal Achievement: 07/18/18 Potential to Achieve Goals: Good    Frequency Min 5X/week   Barriers to discharge        Co-evaluation               AM-PAC PT "6  Clicks" Daily Activity  Outcome Measure Difficulty turning over in bed (including adjusting bedclothes, sheets and blankets)?: None Difficulty moving from lying on back to sitting on the side of the bed? : Unable Difficulty sitting down on and standing up from a chair with arms (e.g., wheelchair, bedside commode, etc,.)?: Unable Help needed moving to and from a bed to chair (including a wheelchair)?: A Little Help needed walking in hospital room?: A Little Help needed climbing 3-5 steps with a railing? : A Little 6 Click Score: 15    End of Session Equipment Utilized During Treatment: Gait belt Activity Tolerance: Patient tolerated treatment well Patient left: in chair;with family/visitor present;with call bell/phone within reach Nurse Communication: Mobility status PT Visit Diagnosis: Unsteadiness on feet (R26.81);Other abnormalities of gait and mobility (R26.89)    Time: 4696-2952 PT Time Calculation (min) (ACUTE ONLY): 31 min   Charges:   PT Evaluation $PT Eval Moderate Complexity: 1 Mod PT Treatments $Gait Training: 8-22 mins        Rolm Bookbinder, SPT Acute Rehab [phone] (pager) [phone] (office)  Curren Mohrmann 07/04/2018, 10:54 AM

## 2018-07-04 NOTE — Discharge Summary (Signed)
Physician Discharge Summary  Patient ID: NATANIEL GASPER MRN: [id number] DOB/AGE: 1955-11-19 62 y.o.  Admit date: 07/03/2018 Discharge date: 07/04/2018  Admission Diagnoses:Herniated lumbar disc with spondylosis L 5 S 1 left with radiculopathy  Discharge Diagnoses: Same Active Problems:   Herniated lumbar disc without myelopathy   Discharged Condition: good  Hospital Course: Patient underwent microdiscectomy L 5 S 1 left with resolution of left leg pain and radiculopathy.  Discharged home AM of POD 1.  Consults: None  Significant Diagnostic Studies: None  Treatments: surgery: Patient underwent microdiscectomy L 5 S 1 left with resolution of left leg pain and radiculopathy  Discharge Exam: Blood pressure 115/80, pulse 88, temperature 97.6 F (36.4 C), temperature source Oral, resp. rate 18, height 5\' 11"  (1.803 m), weight 98.4 kg, SpO2 94 %. Neurologic: Alert and oriented X 3, normal strength and tone. Normal symmetric reflexes. Normal coordination and gait Wound:CDI  Disposition: Home  Discharge Instructions    Diet - low sodium heart healthy   Complete by:  As directed    Increase activity slowly   Complete by:  As directed      Allergies as of 07/04/2018      Reactions   Sulfa Antibiotics Nausea And Vomiting      Medication List    TAKE these medications   cyclobenzaprine 5 MG tablet Commonly known as:  FLEXERIL Take 1 tablet (5 mg total) by mouth 3 (three) times daily as needed for muscle spasms.   meloxicam 7.5 MG tablet Commonly known as:  MOBIC Take 1 tablet by mouth 2 (two) times daily.   methocarbamol 500 MG tablet Commonly known as:  ROBAXIN Take 1 tablet (500 mg total) by mouth every 6 (six) hours as needed for muscle spasms.   oxyCODONE 5 MG immediate release tablet Commonly known as:  Oxy IR/ROXICODONE Take 1-2 tablets (5-10 mg total) by mouth every 4 (four) hours as needed for moderate pain ((score 4 to 6)).        Signed: Dorian Heckle, MD 07/04/2018, 8:49 AM

## 2018-07-04 NOTE — Progress Notes (Signed)
PT Progress Note for Charges    07/04/18 1000  PT General Charges  $$ ACUTE PT VISIT 1 Visit  PT Evaluation  $PT Eval Moderate Complexity 1 Mod  PT Treatments  $Gait Training 8-22 mins   Divon Krabill, PT, DPT  Acute Rehabilitation Services Pager [phone] Office [phone]   

## 2018-07-04 NOTE — Progress Notes (Signed)
Patient discharged home. Nurse went over discharge paperwork, all questions and concerns addressed. Patient discharged home with wife.

## 2018-07-04 NOTE — Evaluation (Signed)
Occupational Therapy Evaluation Patient Details Name: Shane Kerr MRN: [id number] DOB: [birth year] Today's Date: 07/04/2018    History of Present Illness Pt is a 62 y.o male with a history of LBP and bilateral leg numbness, with the LLE worse than the R caused from a ruptured lumbar disc of L5/S1. Pt is now sp L lumbar 5 Sacral 1 microdiscectomy performed on 07/03/2018.     Clinical Impression   Pt admitted with the above diagnoses and presents with below problem list. PTA pt was independent with ADLs. Pt is currently min guard with functional mobility and toilet and shower transfers, min A for LB ADLs due to difficulty raising B feet in seated position. Discussed AE and strategies. Pt is for d/c home today.     Follow Up Recommendations  No OT follow up;Supervision - Intermittent    Equipment Recommendations  None recommended by OT    Recommendations for Other Services       Precautions / Restrictions Precautions Precautions: Back;Fall Precaution Booklet Issued: No Precaution Comments: pt demonstrated understadning of no bending, liifting or twisting. Restrictions Weight Bearing Restrictions: No      Mobility Bed Mobility               General bed mobility comments: up in chair  Transfers Overall transfer level: Needs assistance Equipment used: Rolling walker (2 wheeled) Transfers: Sit to/from Stand Sit to Stand: Min guard         General transfer comment: to/from recliner and comfort height toilet    Balance Overall balance assessment: Needs assistance Sitting-balance support: Feet supported Sitting balance-Leahy Scale: Good     Standing balance support: Bilateral upper extremity supported Standing balance-Leahy Scale: Fair Standing balance comment: Pt relied on UE support during standing initially. Decreased reliance of UE support at end of session                           ADL either performed or assessed with clinical judgement    ADL Overall ADL's : Needs assistance/impaired Eating/Feeding: Set up;Sitting   Grooming: Set up;Min guard;Sitting;Standing   Upper Body Bathing: Set up;Sitting   Lower Body Bathing: Min guard;Minimal assistance;Sit to/from stand   Upper Body Dressing : Set up;Sitting   Lower Body Dressing: Min guard;Minimal assistance;Sit to/from stand   Toilet Transfer: Min guard;Ambulation   Toileting- Clothing Manipulation and Hygiene: Min guard;Minimal assistance;Sit to/from stand Toileting - Clothing Manipulation Details (indicate cue type and reason): dicussed AE for pericare if needed Tub/ Shower Transfer: Walk-in shower;Min guard;Ambulation;Rolling walker;Shower seat   Functional mobility during ADLs: Min guard;Rolling walker General ADL Comments: Pt completed toilet transfer and grooming task standing at sink. Pt with difficulty raising B feet to access for LB ADLs. Discussed AE     Vision         Perception     Praxis      Pertinent Vitals/Pain Pain Assessment: Faces Faces Pain Scale: Hurts little more Pain Location: back/ LLE Pain Descriptors / Indicators: Nagging;Aching;Jabbing Pain Intervention(s): Monitored during session;Repositioned;Premedicated before session     Hand Dominance Right   Extremity/Trunk Assessment Upper Extremity Assessment Upper Extremity Assessment: Overall WFL for tasks assessed   Lower Extremity Assessment Lower Extremity Assessment: Defer to PT evaluation       Communication Communication Communication: No difficulties   Cognition Arousal/Alertness: Awake/alert Behavior During Therapy: WFL for tasks assessed/performed Overall Cognitive Status: Within Functional Limits for tasks assessed  General Comments       Exercises     Shoulder Instructions      Home Living Family/patient expects to be discharged to:: Private residence Living Arrangements: Spouse/significant  other Available Help at Discharge: Family;Available 24 hours/day Type of Home: House Home Access: Stairs to enter [employer] of Steps: 2 Entrance Stairs-Rails: Can reach both Home Layout: One level     Bathroom Shower/Tub: Producer, television/film/video: Standard Bathroom Accessibility: Yes   Home Equipment: Walker - standard;Cane - single point   Additional Comments: Dan Humphreys is in possesion of his mother       Prior Functioning/Environment Level of Independence: Independent        Comments: Prior to surgery during intense pain bouts, pt used cane with increased rest breaks.        OT Problem List: Impaired balance (sitting and/or standing);Decreased knowledge of use of DME or AE;Decreased knowledge of precautions;Pain      OT Treatment/Interventions:      OT Goals(Current goals can be found in the care plan section) Acute Rehab OT Goals Patient Stated Goal: To return home and back to normal  OT Frequency:     Barriers to D/C:            Co-evaluation              AM-PAC PT "6 Clicks" Daily Activity     Outcome Measure Help from another person eating meals?: None Help from another person taking care of personal grooming?: None Help from another person toileting, which includes using toliet, bedpan, or urinal?: A Little Help from another person bathing (including washing, rinsing, drying)?: A Little Help from another person to put on and taking off regular upper body clothing?: None Help from another person to put on and taking off regular lower body clothing?: A Little 6 Click Score: 21   End of Session Equipment Utilized During Treatment: Rolling walker  Activity Tolerance: Patient tolerated treatment well Patient left: in chair;with call bell/phone within reach;with family/visitor present  OT Visit Diagnosis: Unsteadiness on feet (R26.81);Pain                Time: 4098-1191 OT Time Calculation (min): 22 min Charges:  OT General  Charges $OT Visit: 1 Visit OT Evaluation $OT Eval Low Complexity: 1 Low   Raynald Kemp, OT Acute Rehabilitation Services Pager: [phone] Office: [phone]   Pilar Grammes 07/04/2018, 1:32 PM

## 2018-07-04 NOTE — Progress Notes (Signed)
Subjective: Patient reports doing much better  Objective: Vital signs in last 24 hours: Temp:  [97.5 F (36.4 C)-98.2 F (36.8 C)] 97.6 F (36.4 C) (11/01 0405) Pulse Rate:  [75-101] 88 (11/01 0405) Resp:  [8-21] 18 (10/31 1948) BP: (115-166)/(73-97) 115/80 (11/01 0405) SpO2:  [93 %-100 %] 94 % (11/01 0405) Weight:  [98.4 kg] 98.4 kg (10/31 1311)  Intake/Output from previous day: 10/31 0701 - 11/01 0700 In: 2304.9 [P.O.:360; I.V.:1344.9; IV Piggyback:100] Out: 50 [Blood:50] Intake/Output this shift: No intake/output data recorded.  Physical Exam: trength full.  Dressing CDI.  Lab Results: No results for input(s): WBC, HGB, HCT, PLT in the last 72 hours. BMET No results for input(s): NA, K, CL, CO2, GLUCOSE, BUN, CREATININE, CALCIUM in the last 72 hours.  Studies/Results: Dg Lumbar Spine 2-3 Views  Result Date: 07/03/2018 CLINICAL DATA:  L5-S1 diskectomy EXAM: LUMBAR SPINE - 2-3 VIEW COMPARISON:  None. FINDINGS: Surgical instruments are seen at the L5-S1 level. IMPRESSION: Surgical instruments are seen at the L5-S1 level. Electronically Signed   By: Gerome Sam III M.D   On: 07/03/2018 19:41    Assessment/Plan: Patient is doing well.  Discharge home.    LOS: 0 days    Dorian Heckle, MD 07/04/2018, 8:48 AM

## 2018-07-04 NOTE — Care Management Note (Signed)
Case Management Note  Patient Details  Name: Shane Kerr MRN: [id number] Date of Birth: Nov 16, 1955  Subjective/Objective:     Pt s/p: Left Lumbar five Sacral one Microdiscectomy. He is from home with his spouse.  Pt has cane at home.  Pt denies any issues with obtaining his medications or with transportation.                Action/Plan: Pt discharging home with self care. Wife to provide assistance at home.  Pt with orders for walker. Jermaine with [facility] notified and will deliver it to the room.  Wife to provide transport home.   Expected Discharge Date:  07/04/18               Expected Discharge Plan:  Home/Self Care  In-House Referral:     Discharge planning Services  CM Consult  Post Acute Care Choice:  Durable Medical Equipment Choice offered to:  Patient, Spouse  DME Arranged:  Walker rolling DME Agency:  [facility]  HH Arranged:    [facility] Agency:     Status of Service:  Completed, signed off  If discussed at Long Length of Stay Meetings, dates discussed:    Additional Comments:  Kermit Balo, RN 07/04/2018, 11:05 AM

## 2019-01-04 ENCOUNTER — Other Ambulatory Visit: Payer: Self-pay

## 2019-01-04 ENCOUNTER — Inpatient Hospital Stay (HOSPITAL_BASED_OUTPATIENT_CLINIC_OR_DEPARTMENT_OTHER)
Admission: EM | Admit: 2019-01-04 | Discharge: 2019-01-06 | DRG: 251 | Disposition: A | Discharge status: Home / Self Care | Payer: 59 | Attending: Cardiology | Admitting: Cardiology

## 2019-01-04 ENCOUNTER — Encounter (HOSPITAL_BASED_OUTPATIENT_CLINIC_OR_DEPARTMENT_OTHER): Payer: Self-pay | Admitting: Emergency Medicine

## 2019-01-04 ENCOUNTER — Emergency Department (HOSPITAL_BASED_OUTPATIENT_CLINIC_OR_DEPARTMENT_OTHER): Payer: 59

## 2019-01-04 DIAGNOSIS — M79622 Pain in left upper arm: Secondary | ICD-10-CM | POA: Diagnosis not present

## 2019-01-04 DIAGNOSIS — R079 Chest pain, unspecified: Secondary | ICD-10-CM | POA: Diagnosis not present

## 2019-01-04 DIAGNOSIS — Z8249 Family history of ischemic heart disease and other diseases of the circulatory system: Secondary | ICD-10-CM | POA: Diagnosis not present

## 2019-01-04 DIAGNOSIS — R0602 Shortness of breath: Secondary | ICD-10-CM | POA: Diagnosis not present

## 2019-01-04 DIAGNOSIS — Z882 Allergy status to sulfonamides status: Secondary | ICD-10-CM | POA: Diagnosis not present

## 2019-01-04 DIAGNOSIS — Z79899 Other long term (current) drug therapy: Secondary | ICD-10-CM | POA: Diagnosis not present

## 2019-01-04 DIAGNOSIS — I214 Non-ST elevation (NSTEMI) myocardial infarction: Principal | ICD-10-CM

## 2019-01-04 DIAGNOSIS — I493 Ventricular premature depolarization: Secondary | ICD-10-CM | POA: Diagnosis not present

## 2019-01-04 DIAGNOSIS — Z1159 Encounter for screening for other viral diseases: Secondary | ICD-10-CM

## 2019-01-04 DIAGNOSIS — I2510 Atherosclerotic heart disease of native coronary artery without angina pectoris: Secondary | ICD-10-CM | POA: Diagnosis not present

## 2019-01-04 DIAGNOSIS — Z9861 Coronary angioplasty status: Secondary | ICD-10-CM

## 2019-01-04 LAB — CBC
HCT: 48.8 % (ref 39.0–52.0)
Hemoglobin: 16.4 g/dL (ref 13.0–17.0)
MCH: 30.9 pg (ref 26.0–34.0)
MCHC: 33.6 g/dL (ref 30.0–36.0)
MCV: 91.9 fL (ref 80.0–100.0)
Platelets: 204 10*3/uL (ref 150–400)
RBC: 5.31 MIL/uL (ref 4.22–5.81)
RDW: 12.5 % (ref 11.5–15.5)
WBC: 12.5 10*3/uL — ABNORMAL HIGH (ref 4.0–10.5)
nRBC: 0 % (ref 0.0–0.2)

## 2019-01-04 LAB — TROPONIN I
Troponin I: 0.11 ng/mL (ref <0.03)
Troponin I: 0.67 ng/mL (ref <0.03)

## 2019-01-04 LAB — BASIC METABOLIC PANEL
Anion gap: 7 (ref 5–15)
BUN: 15 mg/dL (ref 8–23)
CO2: 24 mmol/L (ref 22–32)
Calcium: 9.3 mg/dL (ref 8.9–10.3)
Chloride: 107 mmol/L (ref 98–111)
Creatinine, Ser: 1.07 mg/dL (ref 0.61–1.24)
GFR calc Af Amer: >60 mL/min (ref >60)
GFR calc non Af Amer: >60 mL/min (ref >60)
Glucose, Bld: 109 mg/dL — ABNORMAL HIGH (ref 70–99)
Potassium: 3.9 mmol/L (ref 3.5–5.1)
Sodium: 138 mmol/L (ref 135–145)

## 2019-01-04 LAB — LIPID PANEL
Cholesterol: 138 mg/dL (ref 0–200)
HDL: 57 mg/dL (ref >40)
LDL Cholesterol: 75 mg/dL (ref 0–99)
Total CHOL/HDL Ratio: 2.4 RATIO
Triglycerides: 31 mg/dL (ref <150)
VLDL: 6 mg/dL (ref 0–40)

## 2019-01-04 LAB — HEPARIN LEVEL (UNFRACTIONATED): Heparin Unfractionated: 0.43 IU/mL (ref 0.30–0.70)

## 2019-01-04 LAB — SARS CORONAVIRUS 2 AG (30 MIN TAT): SARS Coronavirus 2 Ag: NEGATIVE

## 2019-01-04 MED ORDER — SODIUM CHLORIDE 0.9 % WEIGHT BASED INFUSION
1.0000 mL/kg/h | INTRAVENOUS | Status: DC
  Administered 2019-01-05: 1 mL/kg/h via INTRAVENOUS

## 2019-01-04 MED ORDER — ASPIRIN EC 81 MG PO TBEC
81.0000 mg | DELAYED_RELEASE_TABLET | Freq: Every day | ORAL | Status: DC
  Administered 2019-01-06: 81 mg via ORAL
  Filled 2019-01-04: qty 1

## 2019-01-04 MED ORDER — SODIUM CHLORIDE 0.9 % IV SOLN: 250.0000 mL | INTRAVENOUS | Status: DC | PRN

## 2019-01-04 MED ORDER — SODIUM CHLORIDE 0.9% FLUSH: 3.0000 mL | Freq: Two times a day (BID) | INTRAVENOUS | Status: DC

## 2019-01-04 MED ORDER — NITROGLYCERIN 0.4 MG SL SUBL
0.4000 mg | SUBLINGUAL_TABLET | SUBLINGUAL | Status: DC | PRN
  Administered 2019-01-04: 0.4 mg via SUBLINGUAL
  Filled 2019-01-04: qty 1

## 2019-01-04 MED ORDER — ACETAMINOPHEN 325 MG PO TABS: 650.0000 mg | ORAL_TABLET | ORAL | Status: DC | PRN

## 2019-01-04 MED ORDER — ONDANSETRON HCL 4 MG/2ML IJ SOLN: 4.0000 mg | Freq: Four times a day (QID) | INTRAMUSCULAR | Status: DC | PRN

## 2019-01-04 MED ORDER — ASPIRIN 81 MG PO CHEW
81.0000 mg | CHEWABLE_TABLET | ORAL | Status: AC
  Administered 2019-01-05: 81 mg via ORAL
  Filled 2019-01-04: qty 1

## 2019-01-04 MED ORDER — ATORVASTATIN CALCIUM 80 MG PO TABS
80.0000 mg | ORAL_TABLET | Freq: Every day | ORAL | Status: DC
  Administered 2019-01-04 – 2019-01-05 (×2): 80 mg via ORAL
  Filled 2019-01-04 (×3): qty 1

## 2019-01-04 MED ORDER — SODIUM CHLORIDE 0.9 % WEIGHT BASED INFUSION
3.0000 mL/kg/h | INTRAVENOUS | Status: AC
  Administered 2019-01-05: 3 mL/kg/h via INTRAVENOUS

## 2019-01-04 MED ORDER — ASPIRIN 81 MG PO CHEW
324.0000 mg | CHEWABLE_TABLET | Freq: Once | ORAL | Status: AC
  Administered 2019-01-04: 324 mg via ORAL
  Filled 2019-01-04: qty 4

## 2019-01-04 MED ORDER — HEPARIN BOLUS VIA INFUSION
4000.0000 [IU] | Freq: Once | INTRAVENOUS | Status: AC
  Administered 2019-01-04: 4000 [IU] via INTRAVENOUS

## 2019-01-04 MED ORDER — HEPARIN (PORCINE) 25000 UT/250ML-% IV SOLN
1200.0000 [IU]/h | INTRAVENOUS | Status: DC
  Administered 2019-01-04: 1200 [IU]/h via INTRAVENOUS
  Filled 2019-01-04: qty 250

## 2019-01-04 MED ORDER — METOPROLOL TARTRATE 25 MG PO TABS
25.0000 mg | ORAL_TABLET | Freq: Two times a day (BID) | ORAL | Status: DC
  Administered 2019-01-04 – 2019-01-06 (×4): 25 mg via ORAL
  Filled 2019-01-04 (×3): qty 1

## 2019-01-04 MED ORDER — NITROGLYCERIN 0.4 MG SL SUBL: 0.4000 mg | SUBLINGUAL_TABLET | SUBLINGUAL | Status: DC | PRN

## 2019-01-04 MED ORDER — SODIUM CHLORIDE 0.9% FLUSH: 3.0000 mL | INTRAVENOUS | Status: DC | PRN

## 2019-01-04 MED ORDER — ASPIRIN 300 MG RE SUPP: 300.0000 mg | RECTAL | Status: DC

## 2019-01-04 MED ORDER — ASPIRIN 81 MG PO CHEW: 324.0000 mg | CHEWABLE_TABLET | ORAL | Status: DC

## 2019-01-04 NOTE — ED Provider Notes (Signed)
  Physical Exam  BP 135/87 (BP Location: Right Arm)   Pulse 75   Temp 98.5 F (36.9 C) (Oral)   Resp 17   Ht 5\' 11"  (1.803 m)   Wt 99.8 kg   SpO2 99%   BMI 30.68 kg/m   Physical Exam  ED Course/Procedures   Clinical Course as of Jan 04 1599  Sun Jan 04, 2019  748 63 year old male with no known cardiac disease here with 2 episodes of chest pain yesterday and today.  Differential diagnosis includes ACS with unstable angina, pneumonia, pneumothorax, dissection, musculoskeletal, GERD.   [MB]  1439 Initial EKG unremarkable.  He received aspirin and a single nitro here which resolved his chest pain.   [MB]    Clinical Course User Index [MB] Terrilee Files, MD    .Critical Care Performed by: Alvira Monday, MD Authorized by: Alvira Monday, MD   Critical care provider statement:    Critical care time (minutes):  30   Critical care was time spent personally by me on the following activities:  Ordering and review of laboratory studies, discussions with consultants, development of treatment plan with patient or surrogate and evaluation of patient's response to treatment    MDM   Received care of patient from Dr. Charm Barges at [facility].  Briefly, this is a 63 year old male who presented with concern for left-sided chest pain.  Chest pain yesterday initially was limited to while he was exerting himself in the yard, however today he began to have chest pain at rest with radiation to the left shoulder and nausea.  EKG was evaluate by me and shows no acute changes.  Troponin resulted and is 0.11.  Discussed history and results with patient and his wife on the phone.  Will initiate heparin drip with concern for N STEMI.  He is chest pain-free after receiving nitroglycerin in the emergency department.  Discussed with Cardiology, Dr. Abby Potash who has accepted patient in transfer to [facility].   Alvira Monday, MD 01/04/19 [id number]

## 2019-01-04 NOTE — ED Notes (Signed)
Results given ED MD and RN, troponin 0.11

## 2019-01-04 NOTE — ED Provider Notes (Signed)
[facility] EMERGENCY DEPARTMENT Provider Note   CSN: [id number] Arrival date & time: 01/04/19  1406    History   Chief Complaint Chief Complaint  Patient presents with  . Chest Pain    HPI Shane Kerr is a 63 y.o. male.  He has a prior history of back problems.  He is here with a complaint of chest pain.  He experienced an episode yesterday while doing yard work that was substernal in nature 4 out of 10 intensity that lasted about 30 minutes and improved with rest.  Pain recurred again today at around 1030 while at rest 4 out of 10 intensity and remains there is still at 2 out of 10 intensity.  It is a pressure central upper chest radiating into the left upper arm associated with some nausea and shortness of breath.  He said he felt hot but did not break out in a sweat.  He has no prior history of cardiac disease.  He cannot recall his last stress test but said it was years ago.  No recent illness, no fevers or chills, cough vomiting diarrhea or urinary symptoms.     The history is provided by the patient.  Chest Pain  Pain location:  Substernal area Pain quality: pressure   Pain radiates to:  L arm Pain severity:  Mild Onset quality:  Sudden Timing:  Constant Progression:  Improving Chronicity:  New Context: at rest   Relieved by:  None tried Worsened by:  Nothing Ineffective treatments:  None tried Associated symptoms: back pain (chronic), dizziness (episodic for few months - positional), nausea and shortness of breath   Associated symptoms: no abdominal pain, no altered mental status, no cough, no diaphoresis, no fatigue, no fever, no headache, no heartburn, no lower extremity edema, no numbness, no syncope and no vomiting   Risk factors: male sex     Past Medical History:  Diagnosis Date  . Depression     Patient Active Problem List   Diagnosis Date Noted  . Herniated lumbar disc without myelopathy 07/03/2018    Past Surgical History:  Procedure  Laterality Date  . BACK SURGERY    . KNEE ARTHROSCOPY Left   . LUMBAR LAMINECTOMY/DECOMPRESSION MICRODISCECTOMY Left 07/03/2018   Procedure: Left Lumbar five Sacral one Microdiscectomy;  Surgeon: Maeola Harman, MD;  Location: [facility] OR;  Service: Neurosurgery;  Laterality: Left;  . LUMBAR MICRODISCECTOMY Left 07/03/2018   Left Lumbar five Sacral one Microdiscectomy  . TONSILLECTOMY  1960s  . TRIGGER FINGER RELEASE Right 06/2014   "middle finger"        Home Medications    Prior to Admission medications   Medication Sig Start Date End Date Taking? Authorizing Provider  cyclobenzaprine (FLEXERIL) 5 MG tablet Take 1 tablet (5 mg total) by mouth 3 (three) times daily as needed for muscle spasms. 04/23/16   Wallis Bamberg, PA-C  meloxicam (MOBIC) 7.5 MG tablet Take 1 tablet by mouth 2 (two) times daily. 06/07/18   [provider]  methocarbamol (ROBAXIN) 500 MG tablet Take 1 tablet (500 mg total) by mouth every 6 (six) hours as needed for muscle spasms. 07/04/18   Maeola Harman, MD  oxyCODONE (OXY IR/ROXICODONE) 5 MG immediate release tablet Take 1-2 tablets (5-10 mg total) by mouth every 4 (four) hours as needed for moderate pain ((score 4 to 6)). 07/04/18   Maeola Harman, MD    Family History Family History  Problem Relation Age of Onset  . Heart disease Mother   .  Arthritis Mother     Social History Social History   Tobacco Use  . Smoking status: Never Smoker  . Smokeless tobacco: Never Used  Substance Use Topics  . Alcohol use: Yes    Comment: 07/03/2018 "might have 1 beer/month; if that"  . Drug use: Never     Allergies   Sulfa antibiotics   Review of Systems Review of Systems  Constitutional: Negative for diaphoresis, fatigue and fever.  HENT: Negative for sore throat.   Eyes: Negative for visual disturbance.  Respiratory: Positive for shortness of breath. Negative for cough.   Cardiovascular: Positive for chest pain. Negative for syncope.  Gastrointestinal:  Positive for nausea. Negative for abdominal pain, heartburn and vomiting.  Genitourinary: Negative for dysuria.  Musculoskeletal: Positive for back pain (chronic).  Skin: Negative for rash.  Neurological: Positive for dizziness (episodic for few months - positional). Negative for numbness and headaches.     Physical Exam Updated Vital Signs BP (!) 165/103 (BP Location: Right Arm)   Pulse 87   Temp 98.5 F (36.9 C) (Oral)   Resp 17   SpO2 100%   Physical Exam Vitals signs and nursing note reviewed.  Constitutional:      Appearance: He is well-developed.  HENT:     Head: Normocephalic and atraumatic.  Eyes:     Conjunctiva/sclera: Conjunctivae normal.  Neck:     Musculoskeletal: Neck supple.  Cardiovascular:     Rate and Rhythm: Normal rate and regular rhythm.     Pulses:          Radial pulses are 2+ on the right side and 2+ on the left side.     Heart sounds: Normal heart sounds. No murmur.  Pulmonary:     Effort: Pulmonary effort is normal. No respiratory distress.     Breath sounds: Normal breath sounds.  Abdominal:     Palpations: Abdomen is soft. There is no mass.     Tenderness: There is no abdominal tenderness. There is no guarding or rebound.  Musculoskeletal: Normal range of motion.     Right lower leg: He exhibits no tenderness. No edema.     Left lower leg: He exhibits no tenderness. No edema.  Skin:    General: Skin is warm and dry.     Capillary Refill: Capillary refill takes less than 2 seconds.  Neurological:     General: No focal deficit present.     Mental Status: He is alert and oriented to person, place, and time.      ED Treatments / Results  Labs (all labs ordered are listed, but only abnormal results are displayed) Labs Reviewed  BASIC METABOLIC PANEL - Abnormal; Notable for the following components:      Result Value   Glucose, Bld 109 (*)    All other components within normal limits  CBC - Abnormal; Notable for the following  components:   WBC 12.5 (*)    All other components within normal limits  TROPONIN I - Abnormal; Notable for the following components:   Troponin I 0.11 (*)    All other components within normal limits  HEMOGLOBIN A1C - Abnormal; Notable for the following components:   Hgb A1c MFr Bld 5.8 (*)    All other components within normal limits  TROPONIN I - Abnormal; Notable for the following components:   Troponin I 0.67 (*)    All other components within normal limits  SARS CORONAVIRUS 2 (HOSP ORDER, PERFORMED IN [facility] VIA ABBOTT  ID)  CBC WITH DIFFERENTIAL/PLATELET  HEPARIN LEVEL (UNFRACTIONATED)  LIPID PANEL  BASIC METABOLIC PANEL  HIV ANTIBODY (ROUTINE TESTING W REFLEX)  TROPONIN I  HEPARIN LEVEL (UNFRACTIONATED)    EKG EKG Interpretation  Date/Time:  Sunday Jan 04 2019 14:14:58 EDT Ventricular Rate:  82 PR Interval:    QRS Duration: 88 QT Interval:  369 QTC Calculation: 431 R Axis:   72 Text Interpretation:  Sinus rhythm Baseline wander in lead(s) V1 V3 No significant change since 10/19 Confirmed by Meridee ScoreButler, Michael [phone]([id number]) on 01/04/2019 2:19:45 PM   Radiology Dg Chest Port 1 View  Result Date: 01/04/2019 CLINICAL DATA:  Chest pain EXAM: PORTABLE CHEST 1 VIEW COMPARISON:  None. FINDINGS: Normal heart size and mediastinal contours. No acute infiltrate or edema. No effusion or pneumothorax. No acute osseous findings. IMPRESSION: Negative chest. Electronically Signed   By: Marnee SpringJonathon  Watts M.D.   On: 01/04/2019 14:43    Procedures .Critical Care Performed by: Terrilee FilesButler, Michael C, MD Authorized by: Terrilee FilesButler, Michael C, MD   Critical care provider statement:    Critical care time (minutes):  30   Critical care time was exclusive of:  Separately billable procedures and treating other patients   Critical care was necessary to treat or prevent imminent or life-threatening deterioration of the following conditions:  Cardiac failure   Critical care was time spent personally by  me on the following activities:  Evaluation of patient's response to treatment, examination of patient, ordering and performing treatments and interventions, ordering and review of laboratory studies, ordering and review of radiographic studies, pulse oximetry, re-evaluation of patient's condition, obtaining history from patient or surrogate, review of old charts and development of treatment plan with patient or surrogate   I assumed direction of critical care for this patient from another provider in my specialty: no     (including critical care time)  Medications Ordered in ED Medications  aspirin chewable tablet 324 mg (has no administration in time range)  nitroGLYCERIN (NITROSTAT) SL tablet 0.4 mg (has no administration in time range)     Initial Impression / Assessment and Plan / ED Course  I have reviewed the triage vital signs and the nursing notes.  Pertinent labs & imaging results that were available during my care of the patient were reviewed by me and considered in my medical decision making (see chart for details).  Clinical Course as of Jan 05 612  Wynelle LinkSun Jan 04, 2019  [id number] 63 year old male with no known cardiac disease here with 2 episodes of chest pain yesterday and today.  Differential diagnosis includes ACS with unstable angina, pneumonia, pneumothorax, dissection, musculoskeletal, GERD.   [MB]  1439 Initial EKG unremarkable.  He received aspirin and a single nitro here which resolved his chest pain.   [MB]    Clinical Course User Index [MB] Terrilee FilesButler, Michael C, MD       Final Clinical Impressions(s) / ED Diagnoses   Final diagnoses:  NSTEMI (non-ST elevated myocardial infarction) [facility](HCC)    ED Discharge Orders    None       Terrilee FilesButler, Michael C, MD 01/05/19 [phone]

## 2019-01-04 NOTE — H&P (Signed)
Shane Kerr is an 63 y.o. male.   Chief Complaint: Chest pain HPI:   63 y.o. Caucasian male  with history of lumbar spinal surgery 06/2018, no other significant past medical history, presented to [facility] ED earlier today after an episode of retrosternal chest pressure that occurred at rest, associated with nausea, lasted for 20 minutes, improved with sublingual nitroglycerin.  Patient had one other episode of similar chest pressure on 01/03/2019 after light labor.  He denies any shortness of breath, palpitations.  He has noticed episodes of dizziness waking up in the morning for several weeks, but denies any syncope.  He denies any fever, chills, or any other infectious symptoms.  Work-up so far has revealed normal EKG, and mildly elevated troponin  0.11.  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  He has not had any angina episodes prior to yesterday.  Past Medical History:  Diagnosis Date  . Depression     Past Surgical History:  Procedure Laterality Date  . BACK SURGERY    . KNEE ARTHROSCOPY Left   . LUMBAR LAMINECTOMY/DECOMPRESSION MICRODISCECTOMY Left 07/03/2018   Procedure: Left Lumbar five Sacral one Microdiscectomy;  Surgeon: Maeola Harman, MD;  Location: [facility] OR;  Service: Neurosurgery;  Laterality: Left;  . LUMBAR MICRODISCECTOMY Left 07/03/2018   Left Lumbar five Sacral one Microdiscectomy  . TONSILLECTOMY  1960s  . TRIGGER FINGER RELEASE Right 06/2014   "middle finger"    Family History  Problem Relation Age of Onset  . Heart disease Mother   . Arthritis Mother   . Diabetes Father    Social History:  reports that he has never smoked. He has never used smokeless tobacco. He reports current alcohol use. He reports that he does not use drugs.  Allergies:  Allergies  Allergen Reactions  . Sulfa Antibiotics Nausea And Vomiting    Review of Systems  Constitution: Negative for  decreased appetite, malaise/fatigue, weight gain and weight loss.  HENT: Negative for congestion.   Eyes: Negative for visual disturbance.  Cardiovascular: Positive for chest pain (Resolved with SL NTG). Negative for dyspnea on exertion, leg swelling, palpitations and syncope.  Respiratory: Negative for shortness of breath.   Endocrine: Negative for cold intolerance.  Hematologic/Lymphatic: Does not bruise/bleed easily.  Skin: Negative for itching and rash.  Musculoskeletal: Negative for myalgias.  Gastrointestinal: Negative for abdominal pain, nausea and vomiting.  Genitourinary: Negative for dysuria.  Neurological: Negative for dizziness and weakness.  Psychiatric/Behavioral: The patient is not nervous/anxious.   All other systems reviewed and are negative.    Blood pressure 135/87, pulse 75, temperature 98.5 F (36.9 C), temperature source Oral, resp. rate 17, height 5\' 11"  (1.803 m), weight 99.8 kg, SpO2 99 %. Body mass index is 30.68 kg/m.  Physical Exam  Constitutional: He is oriented to person, place, and time. He appears well-developed and well-nourished. No distress.  HENT:  Head: Normocephalic and atraumatic.  Eyes: Pupils are equal, round, and reactive to light. Conjunctivae are normal.  Neck: No JVD present.  Cardiovascular: Normal rate, regular rhythm and intact distal pulses.  No murmur heard. Pulmonary/Chest: Effort normal and breath sounds normal. He has no wheezes. He has no rales.  Abdominal: Soft. Bowel sounds are normal. There is no rebound.  Musculoskeletal:        General: No edema.  Lymphadenopathy:    He has no cervical adenopathy.  Neurological: He is alert and oriented to person, place, and  time. No cranial nerve deficit.  Skin: Skin is warm and dry.  Psychiatric: He has a normal mood and affect.  Nursing note and vitals reviewed.   Results for orders placed or performed during the hospital encounter of 01/04/19 (from the past 48 hour(s))  Basic  metabolic panel     Status: Abnormal   Collection Time: 01/04/19  2:22 PM  Result Value Ref Range   Sodium 138 135 - 145 mmol/L   Potassium 3.9 3.5 - 5.1 mmol/L   Chloride 107 98 - 111 mmol/L   CO2 24 22 - 32 mmol/L   Glucose, Bld 109 (H) 70 - 99 mg/dL   BUN 15 8 - 23 mg/dL   Creatinine, Ser 1.611.07 0.61 - 1.24 mg/dL   Calcium 9.3 8.9 - 09.610.3 mg/dL   GFR calc non Af Amer >60 >60 mL/min   GFR calc Af Amer >60 >60 mL/min   Anion gap 7 5 - 15    Comment: Performed at [facility], [address] [place], [place] [address]  CBC     Status: Abnormal   Collection Time: 01/04/19  2:22 PM  Result Value Ref Range   WBC 12.5 (H) 4.0 - 10.5 K/uL   RBC 5.31 4.22 - 5.81 MIL/uL   Hemoglobin 16.4 13.0 - 17.0 g/dL   HCT 98.148.8 19.139.0 - 47.852.0 %   MCV 91.9 80.0 - 100.0 fL   MCH 30.9 26.0 - 34.0 pg   MCHC 33.6 30.0 - 36.0 g/dL   RDW 29.512.5 62.111.5 - 30.815.5 %   Platelets 204 150 - 400 K/uL   nRBC 0.0 0.0 - 0.2 %    Comment: Performed at [facility] [place], [address] [place], [place] [address]  Troponin I - Once     Status: Abnormal   Collection Time: 01/04/19  2:22 PM  Result Value Ref Range   Troponin I 0.11 (HH) <0.03 ng/mL    Comment: CRITICAL RESULT CALLED TO, READ BACK BY AND VERIFIED WITH: GOUGE SOPHIE RN AT 1502 ON 01/04/19 BY I.[email or web address] Performed at [facility], [address] [place], [place] [address]     Labs:   Lab Results  Component Value Date   WBC 12.5 (H) 01/04/2019   HGB 16.4 01/04/2019   HCT 48.8 01/04/2019   MCV 91.9 01/04/2019   PLT 204 01/04/2019    Recent Labs  Lab 01/04/19 1422  NA 138  K 3.9  CL 107  CO2 24  BUN 15  CREATININE 1.07  CALCIUM 9.3  GLUCOSE 109*    Lipid Panel     Component Value Date/Time   CHOL 153 12/09/2012 2103   TRIG 73 12/09/2012 2103   HDL 51 12/09/2012 2103   CHOLHDL 3.0 12/09/2012 2103   VLDL 15 12/09/2012 2103   LDLCALC 87 12/09/2012 2103    BNP (last 3 results) No results for input(s): BNP in the  last 8760 hours.  HEMOGLOBIN A1C No results found for: HGBA1C, MPG  Cardiac Panel (last 3 results) Recent Labs    01/04/19 1422  TROPONINI 0.11*    Lab Results  Component Value Date   TROPONINI 0.11 (HH) 01/04/2019     TSH No results for input(s): TSH in the last 8760 hours.   (Not in a hospital admission)     Current Facility-Administered Medications:  .  acetaminophen (TYLENOL) tablet 650 mg, 650 mg, Oral, Q4H PRN, Kashus Karlen J, MD .  aspirin chewable tablet  324 mg, 324 mg, Oral, NOW **OR** aspirin suppository 300 mg, 300 mg, Rectal, NOW, Yamilex Borgwardt J, MD .  Melene Muller ON 01/05/2019] aspirin EC tablet 81 mg, 81 mg, Oral, Daily, Corian Handley J, MD .  atorvastatin (LIPITOR) tablet 80 mg, 80 mg, Oral, q1800, Jersi Mcmaster J, MD .  heparin ADULT infusion 100 units/mL (25000 units/226mL sodium chloride 0.45%), 1,200 Units/hr, Intravenous, Continuous, Rumbarger, Faye Ramsay, RPH, Last Rate: 12 mL/hr at 01/04/19 1536, 1,200 Units/hr at 01/04/19 1536 .  metoprolol tartrate (LOPRESSOR) tablet 25 mg, 25 mg, Oral, BID, Karlie Aung J, MD .  nitroGLYCERIN (NITROSTAT) SL tablet 0.4 mg, 0.4 mg, Sublingual, Q5 min PRN, Terrilee Files, MD, 0.4 mg at 01/04/19 1427 .  nitroGLYCERIN (NITROSTAT) SL tablet 0.4 mg, 0.4 mg, Sublingual, Q5 Min x 3 PRN, Marbeth Smedley J, MD .  ondansetron (ZOFRAN) injection 4 mg, 4 mg, Intravenous, Q6H PRN, Adaya Garmany J, MD  Current Outpatient Medications:  .  cyclobenzaprine (FLEXERIL) 5 MG tablet, Take 1 tablet (5 mg total) by mouth 3 (three) times daily as needed for muscle spasms., Disp: 60 tablet, Rfl: 1 .  meloxicam (MOBIC) 7.5 MG tablet, Take 1 tablet by mouth 2 (two) times daily., Disp: , Rfl: 5 .  methocarbamol (ROBAXIN) 500 MG tablet, Take 1 tablet (500 mg total) by mouth every 6 (six) hours as needed for muscle spasms., Disp: 60 tablet, Rfl: 1 .  oxyCODONE (OXY IR/ROXICODONE) 5 MG immediate release tablet, Take 1-2  tablets (5-10 mg total) by mouth every 4 (four) hours as needed for moderate pain ((score 4 to 6))., Disp: 60 tablet, Rfl: 0   Today's Vitals   01/04/19 1500 01/04/19 1519 01/04/19 1530 01/04/19 1538  BP: (!) 147/82  120/75 135/87  Pulse: 83  77 75  Resp:    17  Temp:      TempSrc:      SpO2: 99%  97% 99%  Weight:  99.8 kg    Height:   (1.803 m)    PainSc:       Body mass index is 30.68 kg/m.   CARDIAC STUDIES:  EKG 01/04/2019: Sinus rhythm 82 bpm. Normal EKG   Telemetry 01/04/2019: Frequent PVCs.   Assessment/Plan  63 y.o. Caucasian male admitted with chest pain  NSTEMI: Aspirin/Heparin/Lipitor 80 mg/metoprolol PO 25 mg bid. NPO after midnight. Echocardiogram pending. Trend troponin. Check lipid panel, A1C. Cath tomorrow AM  Will add P2Y12 inhibitor after coronary angiography  Plan, risks benefits and alternate options of coronary angiography discussed in detail with patient, and his wife [name] over the phone.   Elder Negus, MD 01/04/2019, 3:51 PM [facility] [place] Pager: [phone] Office: [phone] If no answer: [phone]

## 2019-01-04 NOTE — ED Notes (Signed)
[facility] arrived to transport [facility] 

## 2019-01-04 NOTE — ED Notes (Signed)
Pt wife updated with plan of care via phone. EDP at bedside.

## 2019-01-04 NOTE — ED Notes (Signed)
Report given to christy, Charity fundraiser at [facility]

## 2019-01-04 NOTE — Progress Notes (Signed)
ANTICOAGULATION CONSULT NOTE - Follow Up Consult  Pharmacy Consult for heparin Indication: NSTEMI  Labs: Recent Labs    01/04/19 1422 01/04/19 1834 01/04/19 2234  HGB 16.4  --   --   HCT 48.8  --   --   PLT 204  --   --   HEPARINUNFRC  --   --  0.43  CREATININE 1.07  --   --   TROPONINI 0.11* 0.67*  --     Assessment/Plan:  63yo male therapeutic on heparin with initial dosing for NSTEMI. Will continue gtt at current rate and confirm stable with am labs.   Vernard Gambles, PharmD, BCPS  01/04/2019,11:48 PM

## 2019-01-04 NOTE — ED Triage Notes (Signed)
L side chest pain started yesterday while working in the yard. The pain subsided with rest. The pain returned today. Described as pressure, non radiating. Denies SOB

## 2019-01-04 NOTE — Progress Notes (Signed)
Notified by lab Trop 0.67  Previous Trop 0.11 Pt is currently CP free, Heparin infusing per orders and scheduled for Cardiac Cath tomorrow. Will continue to monitor. Dierdre Highman, RN

## 2019-01-04 NOTE — Progress Notes (Signed)
Metoprolol given at 1820. Dr. Rosemary Holms stated pt needed to get night time dose of metoprolol as well as long as vitals stable. Cont to monitor. Emelda Brothers RN

## 2019-01-04 NOTE — Progress Notes (Signed)
ANTICOAGULATION CONSULT NOTE - Initial Consult  Pharmacy Consult for heparin Indication: chest pain/ACS  Allergies  Allergen Reactions  . Sulfa Antibiotics Nausea And Vomiting    Patient Measurements: Height: 5\' 11"  (180.3 cm) Weight: 220 lb (99.8 kg) IBW/kg (Calculated) : 75.3 Heparin Dosing Weight: 95.8kg  Vital Signs: Temp: 98.5 F (36.9 C) (05/03 1415) Temp Source: Oral (05/03 1415) BP: 147/82 (05/03 1500) Pulse Rate: 83 (05/03 1500)  Labs: Recent Labs    01/04/19 1422  HGB 16.4  HCT 48.8  PLT 204  CREATININE 1.07  TROPONINI 0.11*    Estimated Creatinine Clearance: 84 mL/min (by C-G formula based on SCr of 1.07 mg/dL).   Medical History: Past Medical History:  Diagnosis Date  . Depression     Medications:  Infusions:  . heparin      Assessment: 62 yom presented to the ED with CP. Troponin elevated and now starting IV heparin. Baseline CBC is WNL and pt is not on anticoagulation PTA.   Goal of Therapy:  Heparin level 0.3-0.7 units/ml Monitor platelets by anticoagulation protocol: Yes   Plan:  Heparin bolus 4000 units IV x 1 Heparin gtt 1200 units/hr Check a 6 hr heparin level  Daily heparin level and CBC  Jebediah Macrae, Drake Leach 01/04/2019,3:24 PM

## 2019-01-04 NOTE — ED Notes (Signed)
After first dose of nitro- pt denies chest pain. BP 149/93

## 2019-01-04 NOTE — ED Notes (Signed)
X-ray at bedside

## 2019-01-05 ENCOUNTER — Encounter (HOSPITAL_COMMUNITY): Payer: Self-pay | Admitting: Cardiology

## 2019-01-05 ENCOUNTER — Encounter (HOSPITAL_COMMUNITY)
Admission: EM | Disposition: A | Discharge status: Home / Self Care | Payer: Self-pay | Source: Home / Self Care | Attending: Cardiology

## 2019-01-05 ENCOUNTER — Inpatient Hospital Stay (HOSPITAL_COMMUNITY): Payer: 59

## 2019-01-05 DIAGNOSIS — I214 Non-ST elevation (NSTEMI) myocardial infarction: Secondary | ICD-10-CM

## 2019-01-05 HISTORY — PX: LEFT HEART CATH AND CORONARY ANGIOGRAPHY: CATH118249

## 2019-01-05 HISTORY — PX: CORONARY BALLOON ANGIOPLASTY: CATH118233

## 2019-01-05 LAB — HEPARIN LEVEL (UNFRACTIONATED): Heparin Unfractionated: 0.53 IU/mL (ref 0.30–0.70)

## 2019-01-05 LAB — ECHOCARDIOGRAM LIMITED
Height: 71 in
Weight: 3419.2 oz

## 2019-01-05 LAB — CBC WITH DIFFERENTIAL/PLATELET
Abs Immature Granulocytes: 0.02 10*3/uL (ref 0.00–0.07)
Basophils Absolute: 0.1 10*3/uL (ref 0.0–0.1)
Basophils Relative: 1 %
Eosinophils Absolute: 0.2 10*3/uL (ref 0.0–0.5)
Eosinophils Relative: 2 %
HCT: 47.4 % (ref 39.0–52.0)
Hemoglobin: 16.1 g/dL (ref 13.0–17.0)
Immature Granulocytes: 0 %
Lymphocytes Relative: 36 %
Lymphs Abs: 3.1 10*3/uL (ref 0.7–4.0)
MCH: 30.8 pg (ref 26.0–34.0)
MCHC: 34 g/dL (ref 30.0–36.0)
MCV: 90.6 fL (ref 80.0–100.0)
Monocytes Absolute: 1 10*3/uL (ref 0.1–1.0)
Monocytes Relative: 12 %
Neutro Abs: 4.2 10*3/uL (ref 1.7–7.7)
Neutrophils Relative %: 49 %
Platelets: 191 10*3/uL (ref 150–400)
RBC: 5.23 MIL/uL (ref 4.22–5.81)
RDW: 12.5 % (ref 11.5–15.5)
WBC: 8.6 10*3/uL (ref 4.0–10.5)
nRBC: 0 % (ref 0.0–0.2)

## 2019-01-05 LAB — POCT ACTIVATED CLOTTING TIME
Activated Clotting Time: 874 seconds
Activated Clotting Time: 235 seconds
Activated Clotting Time: 455 seconds
Activated Clotting Time: 780 seconds
Activated Clotting Time: 230 seconds

## 2019-01-05 LAB — HEMOGLOBIN A1C
Hgb A1c MFr Bld: 5.8 % — ABNORMAL HIGH (ref 4.8–5.6)
Mean Plasma Glucose: 119.76 mg/dL

## 2019-01-05 LAB — BASIC METABOLIC PANEL
Anion gap: 9 (ref 5–15)
BUN: 11 mg/dL (ref 8–23)
CO2: 23 mmol/L (ref 22–32)
Calcium: 8.9 mg/dL (ref 8.9–10.3)
Chloride: 107 mmol/L (ref 98–111)
Creatinine, Ser: 1.23 mg/dL (ref 0.61–1.24)
GFR calc Af Amer: >60 mL/min (ref >60)
GFR calc non Af Amer: >60 mL/min (ref >60)
Glucose, Bld: 94 mg/dL (ref 70–99)
Potassium: 3.9 mmol/L (ref 3.5–5.1)
Sodium: 139 mmol/L (ref 135–145)

## 2019-01-05 LAB — TROPONIN I: Troponin I: 1.06 ng/mL (ref <0.03)

## 2019-01-05 LAB — HIV ANTIBODY (ROUTINE TESTING W REFLEX): HIV Screen 4th Generation wRfx: NONREACTIVE

## 2019-01-05 SURGERY — LEFT HEART CATH AND CORONARY ANGIOGRAPHY
Anesthesia: LOCAL

## 2019-01-05 MED ORDER — TICAGRELOR 90 MG PO TABS
90.0000 mg | ORAL_TABLET | Freq: Two times a day (BID) | ORAL | Status: DC
  Administered 2019-01-05 – 2019-01-06 (×2): 90 mg via ORAL
  Filled 2019-01-05 (×2): qty 1

## 2019-01-05 MED ORDER — SODIUM CHLORIDE 0.9 % IV SOLN
INTRAVENOUS | Status: AC | PRN
  Administered 2019-01-05: 250 mL via INTRAVENOUS

## 2019-01-05 MED ORDER — HEPARIN (PORCINE) IN NACL 1000-0.9 UT/500ML-% IV SOLN
INTRAVENOUS | Status: DC | PRN
  Administered 2019-01-05 (×2): 500 mL

## 2019-01-05 MED ORDER — HEPARIN SODIUM (PORCINE) 1000 UNIT/ML IJ SOLN
INTRAMUSCULAR | Status: DC | PRN
  Administered 2019-01-05 (×2): 5000 [IU] via INTRAVENOUS
  Administered 2019-01-05: 3000 [IU] via INTRAVENOUS

## 2019-01-05 MED ORDER — SODIUM CHLORIDE 0.9% FLUSH
3.0000 mL | Freq: Two times a day (BID) | INTRAVENOUS | Status: DC
  Administered 2019-01-05: 3 mL via INTRAVENOUS

## 2019-01-05 MED ORDER — ACETAMINOPHEN 325 MG PO TABS: 650.0000 mg | ORAL_TABLET | ORAL | Status: DC | PRN

## 2019-01-05 MED ORDER — SODIUM CHLORIDE 0.9 % IV SOLN
INTRAVENOUS | Status: AC
  Administered 2019-01-05: 14:00:00 via INTRAVENOUS

## 2019-01-05 MED ORDER — VERAPAMIL HCL 2.5 MG/ML IV SOLN
INTRAVENOUS | Status: DC | PRN
  Administered 2019-01-05: 10 mL via INTRA_ARTERIAL

## 2019-01-05 MED ORDER — ONDANSETRON HCL 4 MG/2ML IJ SOLN: 4.0000 mg | Freq: Four times a day (QID) | INTRAMUSCULAR | Status: DC | PRN

## 2019-01-05 MED ORDER — LABETALOL HCL 5 MG/ML IV SOLN: 10.0000 mg | INTRAVENOUS | Status: AC | PRN

## 2019-01-05 MED ORDER — MIDAZOLAM HCL 2 MG/2ML IJ SOLN
INTRAMUSCULAR | Status: DC | PRN
  Administered 2019-01-05 (×2): 1 mg via INTRAVENOUS

## 2019-01-05 MED ORDER — TICAGRELOR 90 MG PO TABS
ORAL_TABLET | ORAL | Status: DC | PRN
  Administered 2019-01-05: 180 mg via ORAL

## 2019-01-05 MED ORDER — SODIUM CHLORIDE 0.9 % IV SOLN: 250.0000 mL | INTRAVENOUS | Status: DC | PRN

## 2019-01-05 MED ORDER — IOHEXOL 350 MG/ML SOLN
INTRAVENOUS | Status: DC | PRN
  Administered 2019-01-05: 180 mL via INTRA_ARTERIAL

## 2019-01-05 MED ORDER — VERAPAMIL HCL 2.5 MG/ML IV SOLN
INTRAVENOUS | Status: AC
  Filled 2019-01-05: qty 2

## 2019-01-05 MED ORDER — HEPARIN SODIUM (PORCINE) 1000 UNIT/ML IJ SOLN
INTRAMUSCULAR | Status: AC
  Filled 2019-01-05: qty 1

## 2019-01-05 MED ORDER — HEPARIN (PORCINE) IN NACL 1000-0.9 UT/500ML-% IV SOLN
INTRAVENOUS | Status: AC
  Filled 2019-01-05: qty 1000

## 2019-01-05 MED ORDER — TICAGRELOR 90 MG PO TABS
ORAL_TABLET | ORAL | Status: AC
  Filled 2019-01-05: qty 2

## 2019-01-05 MED ORDER — MIDAZOLAM HCL 2 MG/2ML IJ SOLN
INTRAMUSCULAR | Status: AC
  Filled 2019-01-05: qty 2

## 2019-01-05 MED ORDER — LIDOCAINE HCL (PF) 1 % IJ SOLN
INTRAMUSCULAR | Status: DC | PRN
  Administered 2019-01-05: 2 mL

## 2019-01-05 MED ORDER — FENTANYL CITRATE (PF) 100 MCG/2ML IJ SOLN
INTRAMUSCULAR | Status: DC | PRN
  Administered 2019-01-05: 50 ug via INTRAVENOUS
  Administered 2019-01-05: 25 ug via INTRAVENOUS

## 2019-01-05 MED ORDER — SODIUM CHLORIDE 0.9% FLUSH: 3.0000 mL | INTRAVENOUS | Status: DC | PRN

## 2019-01-05 MED ORDER — HYDRALAZINE HCL 20 MG/ML IJ SOLN: 10.0000 mg | INTRAMUSCULAR | Status: AC | PRN

## 2019-01-05 MED ORDER — FENTANYL CITRATE (PF) 100 MCG/2ML IJ SOLN
INTRAMUSCULAR | Status: AC
  Filled 2019-01-05: qty 2

## 2019-01-05 MED ORDER — NITROGLYCERIN 1 MG/10 ML FOR IR/CATH LAB
INTRA_ARTERIAL | Status: AC
  Filled 2019-01-05: qty 10

## 2019-01-05 MED ORDER — LIDOCAINE HCL (PF) 1 % IJ SOLN
INTRAMUSCULAR | Status: AC
  Filled 2019-01-05: qty 30

## 2019-01-05 MED ORDER — NITROGLYCERIN 1 MG/10 ML FOR IR/CATH LAB
INTRA_ARTERIAL | Status: DC | PRN
  Administered 2019-01-05 (×3): 200 ug via INTRACORONARY

## 2019-01-05 SURGICAL SUPPLY — 19 items
BALLN WOLVERINE 2.00X6 (BALLOONS) ×2
BALLN WOLVERINE 2.50X6 (BALLOONS) ×2
BALLOON WOLVERINE 2.00X6 (BALLOONS) ×1 IMPLANT
BALLOON WOLVERINE 2.50X6 (BALLOONS) ×1 IMPLANT
CATH 5FR JL3.5 JR4 ANG PIG MP (CATHETERS) ×2 IMPLANT
CATH LAUNCHER 6FR AL.75 (CATHETERS) ×2 IMPLANT
CATH LAUNCHER 6FR EBU 3 (CATHETERS) ×2 IMPLANT
CATH LAUNCHER 6FR JL3 (CATHETERS) ×2 IMPLANT
DEVICE RAD COMP TR BAND LRG (VASCULAR PRODUCTS) ×2 IMPLANT
GLIDESHEATH SLEND A-KIT 6F 22G (SHEATH) ×2 IMPLANT
GUIDEWIRE INQWIRE 1.5J.035X260 (WIRE) ×1 IMPLANT
INQWIRE 1.5J .035X260CM (WIRE) ×2
KIT ENCORE 26 ADVANTAGE (KITS) ×2 IMPLANT
KIT HEART LEFT (KITS) ×2 IMPLANT
KIT HEMO VALVE WATCHDOG (MISCELLANEOUS) ×2 IMPLANT
PACK CARDIAC CATHETERIZATION (CUSTOM PROCEDURE TRAY) ×2 IMPLANT
TRANSDUCER W/STOPCOCK (MISCELLANEOUS) ×2 IMPLANT
TUBING CIL FLEX 10 FLL-RA (TUBING) ×2 IMPLANT
WIRE ASAHI PROWATER 180CM (WIRE) ×4 IMPLANT

## 2019-01-05 NOTE — Interval H&P Note (Signed)
History and Physical Interval Note:  01/05/2019 7:29 AM  Shane Kerr  has presented today for surgery, with the diagnosis of NSTEMI.  The various methods of treatment have been discussed with the patient and family. After consideration of risks, benefits and other options for treatment, the patient has consented to  Procedure(s): LEFT HEART CATH AND CORONARY ANGIOGRAPHY (N/A) as a surgical intervention.  The patient's history has been reviewed, patient examined, no change in status, stable for surgery.  I have reviewed the patient's chart and labs.  Questions were answered to the patient's satisfaction.    NSTEMI/Unstable angina, stabilized patient at Intermediate Risk (TIMI Score 3-4)  Patient Information:  Revascularization by PCI or CABG of 1 or more arteries in a patient with NSTEMI or unstable angina withStabilization after presentationIntermediate risk for clinical events Heart Team evaluation should be considered for patient with TIMI 3 flow and multiple coronary artery stenoses and a high burden of CAD  A (7)  Indication: 16; Score: 7   Shane Kerr

## 2019-01-05 NOTE — Progress Notes (Signed)
Subjective:  No chest pain  Objective:  Vital Signs in the last 24 hours: Temp:  [97.7 F (36.5 C)-98.1 F (36.7 C)] 97.7 F (36.5 C) (05/04 1426) Pulse Rate:  [29-87] 74 (05/04 1426) Resp:  [4-33] 16 (05/04 1426) BP: (109-144)/(54-90) 137/77 (05/04 1426) SpO2:  [0 %-100 %] 96 % (05/04 1426) Weight:  [96.9 kg-97 kg] 96.9 kg (05/04 0513)  Intake/Output from previous day: 05/03 0701 - 05/04 0700 In: 188.2 [I.V.:188.2] Out: 400 [Urine:400]  Physical Exam Constitutional: He is oriented to person, place, and time. He appears well-developed and well-nourished. No distress.  HENT:  Head: Normocephalic and atraumatic.  Eyes: Pupils are equal, round, and reactive to light. Conjunctivae are normal.  Neck: No JVD present.  Cardiovascular: Normal rate, regular rhythm and intact distal pulses.  No murmur heard. Pulmonary/Chest: Effort normal and breath sounds normal. He has no wheezes. He has no rales.  Abdominal: Soft. Bowel sounds are normal. There is no rebound.  Musculoskeletal:        General: No edema.  Lymphadenopathy:    He has no cervical adenopathy.  Neurological: He is alert and oriented to person, place, and time. No cranial nerve deficit.  Skin: Skin is warm and dry.  Psychiatric: He has a normal mood and affect.  Nursing note and vitals reviewed.    Lab Results: BMP Recent Labs    06/26/18 0843 01/04/19 1422 01/05/19 0447  NA 138 138 139  K 3.9 3.9 3.9  CL 108 107 107  CO2 25 24 23   GLUCOSE 86 109* 94  BUN 14 15 11   CREATININE 1.33* 1.07 1.23  CALCIUM 9.3 9.3 8.9  GFRNONAA 55* >60 >60  GFRAA >60 >60 >60    CBC Recent Labs  Lab 01/05/19 0447  WBC 8.6  RBC 5.23  HGB 16.1  HCT 47.4  PLT 191  MCV 90.6  MCH 30.8  MCHC 34.0  RDW 12.5  LYMPHSABS 3.1  MONOABS 1.0  EOSABS 0.2  BASOSABS 0.1    HEMOGLOBIN A1C Lab Results  Component Value Date   HGBA1C 5.8 (H) 01/05/2019   MPG 119.76 01/05/2019    Cardiac Panel (last 3 results) Recent Labs     01/04/19 1422 01/04/19 1834 01/05/19 0447  TROPONINI 0.11* 0.67* 1.06*    BNP (last 3 results) No results for input(s): BNP in the last 8760 hours.  TSH No results for input(s): TSH in the last 8760 hours.  Lipid Panel     Component Value Date/Time   CHOL 138 01/04/2019 1834   TRIG 31 01/04/2019 1834   HDL 57 01/04/2019 1834   CHOLHDL 2.4 01/04/2019 1834   VLDL 6 01/04/2019 1834   LDLCALC 75 01/04/2019 1834    CARDIAC STUDIES:  Coronary angiography 01/05/2019: LM: Normal LAD: Prox 50% focal stenosis. Mid 60-70% diffuse disease        Culprit artery D2 with ostial 90% and lateral branch ostial 75% stenosis        Successful balloon angioplasty with 20% residual stenosis. LCx: Normal RCA: RPDA 40% disease Normal LVEDP Normal LVEF  Recommendation: Aggressive medical management. DAPT for 1 year High intensity statin Lipitor 40 mg, metoprolol 25 mg bid. Diet and lifestyle modifications, cardiac rehab  EKG 01/04/2019: Sinus rhythm 82 bpm. Normal EKG   Echocardiogram 01/05/2019:  1. The left ventricle has normal systolic function, with an ejection fraction of 60-65%. The cavity size was normal. Left ventricular diastolic Doppler parameters are consistent with impaired relaxation. No evidence of left ventricular  regional wall  motion abnormalities.  2. No significant valvular abnormalities.  Assessment & Recommendations:  63 y.o. Caucasian male admitted with chest pain  NSTEMI: Culprit vessel ostial diagonal bifurcation lesion. Successful PTCA. Anatomy not favorable for stenting. Also has moderate stenoses along prox and mid LAD. Recommend aggressive medical management. DAPT with Aspirin and Brilinta till 01/2020. Lipitor 80 mg, metoprolol tartarate 25 mg bid  Frequent PVC's: Recommend beta blocker for now/    Elder NegusManish J Kearsten Ginther, M.D. 01/05/2019, 5:25 PM [facility] Pager: [phone] Office: [phone] If no answer:  [phone]

## 2019-01-05 NOTE — Progress Notes (Signed)
Cardiac Rehab Advisory Cardiac Rehab Phase I is not seeing pts face to face at this time due to Covid 19 restrictions. Ambulation is occurring through nursing, PT, and mobility teams. We will help facilitate that process as needed. We are calling pts in their rooms and discussing education. We will then deliver education materials to pts RN for delivery to pt.   [id number] MI education completed with pt by phone. Pt voiced understanding. Stressed importance of brilinta, NTG use, MI restrictions, watching carbs and heart healthy food choices, ex ed and CRP 2. Referred to [place] CRP 2. Materials to be given to pt by staff prior to discharge. Will follow up with pt tomorrow to see if any questions re ed. Luetta Nutting RN BSN 01/05/2019 2:15 PM

## 2019-01-05 NOTE — Progress Notes (Signed)
  Echocardiogram 2D Echocardiogram has been performed.  Shane Kerr 01/05/2019, 4:31 PM

## 2019-01-05 NOTE — Progress Notes (Signed)
  Echocardiogram 2D Echocardiogram has been performed.  Shane Kerr 01/05/2019, 4:32 PM

## 2019-01-06 ENCOUNTER — Encounter: Payer: Self-pay | Admitting: Cardiology

## 2019-01-06 DIAGNOSIS — I493 Ventricular premature depolarization: Secondary | ICD-10-CM

## 2019-01-06 LAB — CBC WITH DIFFERENTIAL/PLATELET
Abs Immature Granulocytes: 0.04 10*3/uL (ref 0.00–0.07)
Basophils Absolute: 0.1 10*3/uL (ref 0.0–0.1)
Basophils Relative: 1 %
Eosinophils Absolute: 0.2 10*3/uL (ref 0.0–0.5)
Eosinophils Relative: 1 %
HCT: 47.1 % (ref 39.0–52.0)
Hemoglobin: 16 g/dL (ref 13.0–17.0)
Immature Granulocytes: 0 %
Lymphocytes Relative: 18 %
Lymphs Abs: 1.9 10*3/uL (ref 0.7–4.0)
MCH: 30.9 pg (ref 26.0–34.0)
MCHC: 34 g/dL (ref 30.0–36.0)
MCV: 90.9 fL (ref 80.0–100.0)
Monocytes Absolute: 1.2 10*3/uL — ABNORMAL HIGH (ref 0.1–1.0)
Monocytes Relative: 11 %
Neutro Abs: 7.1 10*3/uL (ref 1.7–7.7)
Neutrophils Relative %: 69 %
Platelets: 183 10*3/uL (ref 150–400)
RBC: 5.18 MIL/uL (ref 4.22–5.81)
RDW: 12.7 % (ref 11.5–15.5)
WBC: 10.4 10*3/uL (ref 4.0–10.5)
nRBC: 0 % (ref 0.0–0.2)

## 2019-01-06 LAB — BASIC METABOLIC PANEL
Anion gap: 9 (ref 5–15)
BUN: 12 mg/dL (ref 8–23)
CO2: 20 mmol/L — ABNORMAL LOW (ref 22–32)
Calcium: 8.8 mg/dL — ABNORMAL LOW (ref 8.9–10.3)
Chloride: 109 mmol/L (ref 98–111)
Creatinine, Ser: 1.17 mg/dL (ref 0.61–1.24)
GFR calc Af Amer: >60 mL/min (ref >60)
GFR calc non Af Amer: >60 mL/min (ref >60)
Glucose, Bld: 127 mg/dL — ABNORMAL HIGH (ref 70–99)
Potassium: 4 mmol/L (ref 3.5–5.1)
Sodium: 138 mmol/L (ref 135–145)

## 2019-01-06 LAB — HEPARIN LEVEL (UNFRACTIONATED): Heparin Unfractionated: <0.1 IU/mL — ABNORMAL LOW (ref 0.30–0.70)

## 2019-01-06 MED ORDER — ATORVASTATIN CALCIUM 80 MG PO TABS: 80.0000 mg | ORAL_TABLET | Freq: Every day | ORAL | 2 refills | Status: DC

## 2019-01-06 MED ORDER — NITROGLYCERIN 0.4 MG SL SUBL: 0.4000 mg | SUBLINGUAL_TABLET | SUBLINGUAL | 2 refills | Status: DC | PRN

## 2019-01-06 MED ORDER — ASPIRIN 81 MG PO TBEC: 81.0000 mg | DELAYED_RELEASE_TABLET | Freq: Every day | ORAL | 2 refills | Status: DC

## 2019-01-06 MED ORDER — TICAGRELOR 90 MG PO TABS: 90.0000 mg | ORAL_TABLET | Freq: Two times a day (BID) | ORAL | 3 refills | Status: DC

## 2019-01-06 MED ORDER — METOPROLOL TARTRATE 25 MG PO TABS: 25.0000 mg | ORAL_TABLET | Freq: Two times a day (BID) | ORAL | 3 refills | Status: DC

## 2019-01-06 NOTE — Progress Notes (Signed)
[id number] Checked with pt to see if any questions re ed done yesterday. Answered questions on activity at home. Pt voiced understanding. Will sign off. Luetta Nutting RN BSN 01/06/2019 8:25 AM

## 2019-01-06 NOTE — Discharge Summary (Signed)
Physician Discharge Summary  Patient ID: Shane Kerr MRN: [id number] DOB/AGE: [birth year] 63 y.o.  Admit date: 01/04/2019 Discharge date: 01/06/2019  Primary Discharge Diagnosis: NSTEMI  Secondary Discharge Diagnosis: NSTEMI   Hospital Course:   CARDEL WERNETTE  is a 63 y.o. male with no significant cardiac history, was admitted with chest pain. Trop was elevated at 1.07 ng/mL. He was diagnosed with NSTEMI. He underwent balloon angioplasty to diagonal, details below. If recurrent chest pain in future, could consider stress test/dFR to LAD. Recommend aggressive medical management for now. It appears that PVC's have been present for a long time, as per his wife. Cnitnue metoprolol.   Discharge Exam: Blood pressure 139/84, pulse 67, temperature 98.3 F (36.8 C), temperature source Oral, resp. rate 18, height 5\' 11"  (1.803 m), weight 96.2 kg, SpO2 98 %.   Physical exam: Constitutional: He isoriented to person, place, and time. He appearswell-developedand well-nourished.No distress.  HENT:  Head:Normocephalicand atraumatic.  Eyes:Pupils are equal, round, and reactive to light.Conjunctivaeare normal.  Neck:No JVDpresent.  Cardiovascular:Normal rate,regular rhythmand intact distal pulses. No murmurheard. Pulmonary/Chest:Effort normaland breath sounds normal. He hasno wheezes. He hasno rales.  Abdominal:Soft.Bowel sounds are normal. There isno rebound.  Musculoskeletal:  General: No edema.  Lymphadenopathy:  He has no cervical adenopathy.  Neurological: He isalertand oriented to person, place, and time. Nocranial nerve deficit.  Skin: Skin iswarmand dry.  Psychiatric: He has anormal mood and affect. Nursing noteand vitalsreviewed. Recommendations on discharge:   CARDIAC STUDIES:  EKG05/01/2019: Sinus rhythm 70 bpm. Normal EKG  Telemetry: Frequent single PVC's   Coronary angiography 01/05/2019: LM: Normal LAD: Prox 50% focal  stenosis. Mid 60-70% diffuse disease Culprit artery D2 with ostial 90% and lateral branch ostial 75% stenosis Successful balloon angioplasty with 20% residual stenosis. LCx: Normal RCA: RPDA 40% disease Normal LVEDP Normal LVEF  Recommendation: Aggressive medical management. DAPT for 1 year High intensity statin Lipitor 40 mg, metoprolol 25 mg bid. Diet and lifestyle modifications, cardiac rehab   Echocardiogram 01/05/2019: 1. The left ventricle has normal systolic function, with an ejection fraction of 60-65%. The cavity size was normal. Left ventricular diastolic Doppler parameters are consistent with impaired relaxation. No evidence of left ventricular regional wall  motion abnormalities. 2. No significant valvular abnormalities.  Labs:   Lab Results  Component Value Date   WBC 10.4 01/06/2019   HGB 16.0 01/06/2019   HCT 47.1 01/06/2019   MCV 90.9 01/06/2019   PLT 183 01/06/2019    Recent Labs  Lab 01/06/19 0407  NA 138  K 4.0  CL 109  CO2 20*  BUN 12  CREATININE 1.17  CALCIUM 8.8*  GLUCOSE 127*    Lipid Panel     Component Value Date/Time   CHOL 138 01/04/2019 1834   TRIG 31 01/04/2019 1834   HDL 57 01/04/2019 1834   CHOLHDL 2.4 01/04/2019 1834   VLDL 6 01/04/2019 1834   LDLCALC 75 01/04/2019 1834    BNP (last 3 results) No results for input(s): BNP in the last 8760 hours.  HEMOGLOBIN A1C Lab Results  Component Value Date   HGBA1C 5.8 (H) 01/05/2019   MPG 119.76 01/05/2019    Cardiac Panel (last 3 results) Recent Labs    01/04/19 1422 01/04/19 1834 01/05/19 0447  TROPONINI 0.11* 0.67* 1.06*    Lab Results  Component Value Date   TROPONINI 1.06 (HH) 01/05/2019     TSH No results for input(s): TSH in the last 8760 hours.  Radiology: Dg [facility] 1 [address]  Result Date: 01/04/2019 CLINICAL DATA:  Chest pain EXAM: PORTABLE CHEST 1 VIEW COMPARISON:  None. FINDINGS: Normal heart size and mediastinal contours. No acute  infiltrate or edema. No effusion or pneumothorax. No acute osseous findings. IMPRESSION: Negative chest. Electronically Signed   By: Marnee SpringJonathon  Watts M.D.   On: 01/04/2019 14:43      FOLLOW UP PLANS AND APPOINTMENTS Discharge Instructions    Amb Referral to Cardiac Rehabilitation   Complete by:  As directed    Diagnosis:   PTCA NSTEMI     Diet - low sodium heart healthy   Complete by:  As directed    Increase activity slowly   Complete by:  As directed      Allergies as of 01/06/2019      Reactions   Sulfa Antibiotics Nausea And Vomiting      Medication List    TAKE these medications   aspirin 81 MG EC tablet Take 1 tablet (81 mg total) by mouth daily.   atorvastatin 80 MG tablet Commonly known as:  LIPITOR Take 1 tablet (80 mg total) by mouth daily at 6 PM.   metoprolol tartrate 25 MG tablet Commonly known as:  LOPRESSOR Take 1 tablet (25 mg total) by mouth 2 (two) times daily.   nitroGLYCERIN 0.4 MG SL tablet Commonly known as:  NITROSTAT Place 1 tablet (0.4 mg total) under the tongue every 5 (five) minutes as needed for chest pain (Up to 3 doses in 15 minutes.).   ticagrelor 90 MG Tabs tablet Commonly known as:  BRILINTA Take 1 tablet (90 mg total) by mouth 2 (two) times daily.      Follow-up Information    Elder NegusPatwardhan, Jamiya Nims J, MD Follow up on 01/14/2019.   Specialties:  Cardiology, Radiology Why:  10:30 AM Contact information: [address] [place] [address] [phone]            Truett MainlandManish Jamelia Varano MD, [facility] Pager: [phone] Office: [phone] If no answer: [phone]

## 2019-01-08 ENCOUNTER — Telehealth: Payer: Self-pay

## 2019-01-08 NOTE — Telephone Encounter (Signed)
Left a voice mail for pt re: TOC

## 2019-01-08 NOTE — Telephone Encounter (Signed)
-----   Message from Elder Negus, MD sent at 01/06/2019 11:26 AM EDT ----- Regarding: TOC Discharge follow up: TOC: Needed Follow up appt: Scheduled Discharge diagnosis: NSTEMI Discharge date: 01/06/2019  Thanks MJP

## 2019-01-09 NOTE — Telephone Encounter (Signed)
Location of hospitalization: [place] Reason for hospitalization:NSTEMI  Date of discharge: 01/06/19 Date of first communication with patient: today Person contacting patient: Me Current symptoms: n/a Do you understand why you were in the Hospital: Yes Questions regarding discharge instructions: None Where were you discharged to: Home Medications reviewed: Yes Allergies reviewed: Yes Dietary changes reviewed: Yes. Discussed low fat and low salt diet.  Referals reviewed: NA Activities of Daily Living: Able to with mild limitations Any transportation issues/concerns: None Any patient concerns: None Confirmed importance & date/time of Follow up appt: Yes Confirmed with patient if condition begins to worsen call. Pt was given the office number and encouraged to call back with questions or concerns: Yes

## 2019-01-13 ENCOUNTER — Other Ambulatory Visit: Payer: Self-pay | Admitting: *Deleted

## 2019-01-13 NOTE — Progress Notes (Signed)
Follow up visit  Subjective:   Shane Kerr, male    DOB: September 02, 1956, 63 y.o.   MRN: [id number]   Chief Complaint  Patient presents with  . Hypertension    NSTEMI, pt has questions about Metoprolol making him sleepy   . Follow-up    HPI   63 y.o. male with no significant cardiac history, was admitted with chest pain. Trop was elevated at 1.07 ng/mL. He was diagnosed with NSTEMI. He underwent balloon angioplasty to diagonal, details below. He also has moderate disease in pLAD.   Patient is here for transition of care for follow up. He has not had any chest pain or shortness of breath. He has felt more sleepy since starting metoprolol. He has noticed episodes of dizziness with certain head movements. This has occurred also in the past.    Past Medical History:  Diagnosis Date  . Depression      Past Surgical History:  Procedure Laterality Date  . BACK SURGERY    . CORONARY BALLOON ANGIOPLASTY N/A 01/05/2019   Procedure: CORONARY BALLOON ANGIOPLASTY;  Surgeon: Shane Kerr;  Location: [facility] INVASIVE CV LAB;  Service: Cardiovascular;  Laterality: N/A;  . KNEE ARTHROSCOPY Left   . LEFT HEART CATH AND CORONARY ANGIOGRAPHY N/A 01/05/2019   Procedure: LEFT HEART CATH AND CORONARY ANGIOGRAPHY;  Surgeon: Shane Kerr;  Location: [facility] IN[facility]AS[facility] CV LAB;  Service: Cardiovascular;  Laterality: N/A;  . LUMBAR LAMINECTOMY/DECOMPRESSION MICRODISCECTOMY Left 07/03/2018   Procedure: Left Lumbar five Sacral one Microdiscectomy;  Surgeon: Shane Kerr;  [facility];  Service: Neurosurgery;  Laterality: Left;  . LUMBAR MICRODISCECTOMY Left 07/03/2018   Left Lumbar five Sacral one Microdiscectomy  . TONSILLECTOMY  1960s  . TRIGGER FINGER RELEASE Right 06/2014   "middle finger"     Social History   Socioeconomic History  . Marital status: Married    Spouse name: Not on file  . Number of children: Not on file  . Years of education: Not on file  . Highest  education level: Not on file  Occupational History  . Not on file  Social Needs  . Financial resource strain: Not on file  . Food insecurity:    Worry: Not on file    Inability: Not on file  . Transportation needs:    Medical: Not on file    Non-medical: Not on file  Tobacco Use  . Smoking status: Never Smoker  . Smokeless tobacco: Never Used  Substance and Sexual Activity  . Alcohol use: Yes    Comment: 07/03/2018 "might have 1 beer/month; if that"  . Drug use: Never  . Sexual activity: Not Currently  Lifestyle  . Physical activity:    Days per week: Not on file    Minutes per session: Not on file  . Stress: Not on file  Relationships  . Social connections:    Talks on phone: Not on file    Gets together: Not on file    Attends religious service: Not on file    Active member of club or organization: Not on file    Attends meetings of clubs or organizations: Not on file    Relationship status: Not on file  . Intimate partner violence:    Fear of current or ex partner: Not on file    Emotionally abused: Not on file    Physically abused: Not on file    Forced sexual activity: Not on file  Other Topics Concern  .  Not on file  Social History Narrative  . Not on file     Family History  Problem Relation Age of Onset  . Heart disease Mother   . Arthritis Mother   . Diabetes Father      Current Outpatient Medications on File Prior to Visit  Medication Sig Dispense Refill  . aspirin EC 81 MG EC tablet Take 1 tablet (81 mg total) by mouth daily. 90 tablet 2  . atorvastatin (LIPITOR) 80 MG tablet Take 1 tablet (80 mg total) by mouth daily at 6 PM. 90 tablet 2  . metoprolol tartrate (LOPRESSOR) 25 MG tablet Take 1 tablet (25 mg total) by mouth 2 (two) times daily. 180 tablet 3  . nitroGLYCERIN (NITROSTAT) 0.4 MG SL tablet Place 1 tablet (0.4 mg total) under the tongue every 5 (five) minutes as needed for chest pain (Up to 3 doses in 15 minutes.). 30 tablet 2  .  ticagrelor (BRILINTA) 90 MG TABS tablet Take 1 tablet (90 mg total) by mouth 2 (two) times daily. 180 tablet 3   No current facility-administered medications on file prior to visit.     Cardiovascular studies:  EKG 01/14/2019: Sinus rhythm 72 bpm. Frequent PVC's Unchanged compared to previous EKG.  Telemetry: Frequent single PVC's   Coronary angiography 01/05/2019: LM: Normal LAD: Prox 50% focal stenosis. Mid 60-70% diffuse disease Culprit artery D2 with ostial 90% and lateral branch ostial 75% stenosis Successful balloon angioplasty with 20% residual stenosis. LCx: Normal RCA: RPDA 40% disease Normal LVEDP Normal LVEF  Recommendation: Aggressive medical management. DAPT for 1 year High intensity statin Lipitor 40 mg, metoprolol 25 mg bid. Diet and lifestyle modifications, cardiac rehab   Echocardiogram05/12/2018: 1. The left ventricle has normal systolic function, with an ejection fraction of 60-65%. The cavity size was normal. Left ventricular diastolic Doppler parameters are consistent with impaired relaxation. No evidence of left ventricular regional wall  motion abnormalities. 2. No significant valvular abnormalities.  Recent labs:  Results for Shane Kerr (MRN [id number]) as of 01/13/2019 21:32  Ref. Range 01/04/2019 14:22 01/04/2019 18:34 01/05/2019 04:47 01/06/2019 04:07  BASIC METABOLIC PANEL Unknown Rpt (A)  Rpt Rpt (A)  Sodium Latest Ref Range: 135 - 145 mmol/L 138  139 138  Potassium Latest Ref Range: 3.5 - 5.1 mmol/L 3.9  3.9 4.0  Chloride Latest Ref Range: 98 - 111 mmol/L 107  107 109  CO2 Latest Ref Range: 22 - 32 mmol/L (L)  Glucose Latest Ref Range: 70 - 99 mg/dL 119 (H)  94 147 (H)  Mean Plasma Glucose Latest Units: mg/dL   829.56   BUN Latest Ref Range: 8 - 23 mg/dL Creatinine Latest Ref Range: 0.61 - 1.24 mg/dL 2.13  0.86 5.78  Calcium Latest Ref Range: 8.9 - 10.3 mg/dL 9.3  8.9 8.8 (L)  Anion gap Latest Ref  Range: 5 - GFR, Est Non African American Latest Ref Range: >60 mL/min >60  >60 >60  GFR, Est African American Latest Ref Range: >60 mL/min >60  >60 >60  Troponin I Latest Ref Range: <0.03 ng/mL 0.11 (HH) 0.67 (HH) 1.06 (HH)   Total CHOL/HDL Ratio Latest Units: RATIO  2.4    Cholesterol Latest Ref Range: 0 - 200 mg/dL  469    HDL Cholesterol Latest Ref Range: >40 mg/dL  57    LDL (calc) Latest Ref Range: 0 - 99 mg/dL  75  Triglycerides Latest Ref Range: <150 mg/dL  31    VLDL Latest Ref Range: 0 - 40 mg/dL  6     Results for TANAY, POMERANZ (MRN [id number]) as of 01/13/2019 21:32  Ref. Range 01/06/2019 04:07  WBC Latest Ref Range: 4.0 - 10.5 K/uL 10.4  RBC Latest Ref Range: 4.22 - 5.81 MIL/uL 5.18  Hemoglobin Latest Ref Range: 13.0 - 17.0 g/dL 30.1  HCT Latest Ref Range: 39.0 - 52.0 % 47.1  MCV Latest Ref Range: 80.0 - 100.0 fL 90.9  MCH Latest Ref Range: 26.0 - 34.0 pg 30.9  MCHC Latest Ref Range: 30.0 - 36.0 g/dL 60.1  RDW Latest Ref Range: 11.5 - 15.5 % 12.7  Platelets Latest Ref Range: 150 - 400 K/uL 183  nRBC Latest Ref Range: 0.0 - 0.2 % 0.0     Review of Systems  Constitution: Negative for decreased appetite, malaise/fatigue, weight gain and weight loss.       Sleepiness  HENT: Negative for congestion.   Eyes: Negative for visual disturbance.  Cardiovascular: Negative for chest pain, dyspnea on exertion, leg swelling, palpitations and syncope.  Respiratory: Negative for cough.   Endocrine: Negative for cold intolerance.  Hematologic/Lymphatic: Does not bruise/bleed easily.  Skin: Negative for itching and rash.  Musculoskeletal: Negative for myalgias.  Gastrointestinal: Negative for abdominal pain, nausea and vomiting.  Genitourinary: Negative for dysuria.  Neurological: Positive for dizziness. Negative for weakness.  Psychiatric/Behavioral: The patient is not nervous/anxious.   All other systems reviewed and are negative.        Vitals:   01/14/19  1116  BP: 138/77  Pulse: 74  SpO2: 98%    Objective:   Physical Exam  Constitutional: He is oriented to person, place, and time. He appears well-developed and well-nourished. No distress.  HENT:  Head: Normocephalic and atraumatic.  Eyes: Pupils are equal, round, and reactive to light. Conjunctivae are normal.  Neck: No JVD present.  Cardiovascular: Normal rate, regular rhythm and intact distal pulses.  Pulmonary/Chest: Effort normal and breath sounds normal. He has no wheezes. He has no rales.  Abdominal: Soft. Bowel sounds are normal. There is no rebound.  Musculoskeletal:        General: No edema.  Lymphadenopathy:    He has no cervical adenopathy.  Neurological: He is alert and oriented to person, place, and time. No cranial nerve deficit.  Skin: Skin is warm and dry.  Psychiatric: He has a normal mood and affect.  Nursing note and vitals reviewed.         Assessment & Recommendations:   63 y/o Caucasian male with CAD, recent NSTEMI (01/2019)  CAD: Currently no angina symptoms. NSTEMI in 05.2020 with culprit vessel ostial diagonal treated with balloon angioplasty. He has residual disease in nonculprit pLAD. Recommend lexiscan nuclear stress test to risk stratify this. Continue Aspirin and Brilinta till 01/2020. Continue statin, Beta blocker discussion below.  Frequent PVC's: Reportedly, this has been present for a long time. Normal EF. He asymptomatic from PVC's. I has recommended metoprolol 25 mg bid. However, due to to his symptoms of sleepiness, switch this to carvedilol 3.125 mg bid. May need to be increased in future for BP control.  Dizziness: Suspect BPV. Prescribed meclizine for as needed use. Follow up with PCP.  VV f/u after stress test.    Shane Negus, [facility] Pager: [phone] Office: [phone] If no answer Cell [phone]

## 2019-01-13 NOTE — Patient Outreach (Signed)
[facility]) Care Management  01/13/2019  Shane Kerr 01/31/1956 [id number]  Telephone Screening Date referral received: 01/08/19 Initial outreach: 01/13/19 Insurance: [employer]ia Pouch  Subjective:  Initial unsuccessful telephone call to patient's mobile number in order to complete high risk assessment; no answer, left HIPAA compliant voicemail message requesting return call.   Plan: This RNCM will route unsuccessful outreach letter with [facility] Care Management pamphlet and 24 hour Nurse Advice Line Magnet to [employer] Care Management clinical pool to be mailed to patient's home address. This RNCM will attempt another outreach within 4 business days.   Bary Richard RN,CCM,CDE [facility] Care Management Coordinator Office Phone [phone] Office Fax [phone]

## 2019-01-14 ENCOUNTER — Ambulatory Visit (INDEPENDENT_AMBULATORY_CARE_PROVIDER_SITE_OTHER): Payer: 59 | Admitting: Cardiology

## 2019-01-14 ENCOUNTER — Ambulatory Visit: Payer: 59 | Admitting: *Deleted

## 2019-01-14 ENCOUNTER — Encounter: Payer: Self-pay | Admitting: Cardiology

## 2019-01-14 ENCOUNTER — Other Ambulatory Visit: Payer: Self-pay | Admitting: *Deleted

## 2019-01-14 ENCOUNTER — Other Ambulatory Visit: Payer: Self-pay

## 2019-01-14 VITALS — BP 138/77 | HR 74 | Ht 71.0 in | Wt 215.0 lb

## 2019-01-14 DIAGNOSIS — I2510 Atherosclerotic heart disease of native coronary artery without angina pectoris: Secondary | ICD-10-CM

## 2019-01-14 DIAGNOSIS — I10 Essential (primary) hypertension: Secondary | ICD-10-CM

## 2019-01-14 DIAGNOSIS — E782 Mixed hyperlipidemia: Secondary | ICD-10-CM

## 2019-01-14 DIAGNOSIS — R42 Dizziness and giddiness: Secondary | ICD-10-CM | POA: Diagnosis not present

## 2019-01-14 DIAGNOSIS — E785 Hyperlipidemia, unspecified: Secondary | ICD-10-CM

## 2019-01-14 DIAGNOSIS — I493 Ventricular premature depolarization: Secondary | ICD-10-CM | POA: Insufficient documentation

## 2019-01-14 HISTORY — DX: Essential (primary) hypertension: I10

## 2019-01-14 HISTORY — DX: Hyperlipidemia, unspecified: E78.5

## 2019-01-14 MED ORDER — MECLIZINE HCL 12.5 MG PO TABS: 12.5000 mg | ORAL_TABLET | Freq: Two times a day (BID) | ORAL | 1 refills | Status: DC | PRN

## 2019-01-14 MED ORDER — CARVEDILOL 3.125 MG PO TABS: 3.1250 mg | ORAL_TABLET | Freq: Two times a day (BID) | ORAL | 3 refills | Status: DC

## 2019-01-14 NOTE — Patient Outreach (Signed)
[facility]) Care Management  01/14/2019  JORIEL DICHTER 18-Jul-1956 [id number]  Telephone Screening Date referral received: 01/08/19 Initial outreach: 01/13/19 Insurance: Monia Pouch  Subjective:  Second unsuccessful telephone call to patient's mobile number in order to complete high risk assessment; no answer, left HIPAA compliant voicemail message requesting return call.   Plan: This RNCM will attempt another outreach within 4 business days.   Bary Richard RN,CCM,CDE [facility] Care Management Coordinator Office Phone [phone] Office Fax [phone]

## 2019-01-15 ENCOUNTER — Telehealth (HOSPITAL_COMMUNITY): Payer: Self-pay | Admitting: *Deleted

## 2019-01-15 NOTE — Telephone Encounter (Signed)
Called and spoke to pt regarding cardiac rehab and general well being.  Pt is doing well and is walking 20 minutes twice a day and plans to continue to increase his time.  Pt denies any complaints.  Pt expressed that his wife would be interested in additional information on  Meal prep and food choices.  Will send educational information via mail.  Pt has smartphone and internet but is unsure of virtual cardiac rehab.  Presently pt is working remotely due to covid-19 he hopes that continues  but he may be asked to travel with his work.  Advised pt that we are currently not scheduling for Cardiac Rehab and can check back with him regarding his needs and whether a virtual platform would work best for him. Alanson Aly, BSN Cardiac and Emergency planning/management officer

## 2019-01-19 ENCOUNTER — Ambulatory Visit: Payer: Self-pay | Admitting: *Deleted

## 2019-01-20 ENCOUNTER — Other Ambulatory Visit: Payer: Self-pay | Admitting: *Deleted

## 2019-01-20 ENCOUNTER — Ambulatory Visit: Payer: Self-pay | Admitting: *Deleted

## 2019-01-20 NOTE — Patient Outreach (Signed)
[facility]) Care Management  01/20/2019  DONTERIO PAPPALARDO 1955-10-25 [id number]   Telephone Screening Date referral received:01/08/19 Initial outreach:01/13/19 Insurance: Monia Pouch  Third unsuccessful telephone call to patient's preferred number in order to complete [employer] referral high risk screening; no answer, left HIPAA compliant voicemail message requesting return call.   If no return call from patient, will close case to [facility] Care Management services in 10 business days after initial outreach, 01/27/19.   Bary Richard RN,CCM,CDE [facility] Care Management Coordinator Office Phone [phone] Office Fax [phone]

## 2019-01-27 ENCOUNTER — Other Ambulatory Visit: Payer: Self-pay | Admitting: *Deleted

## 2019-01-27 NOTE — Patient Outreach (Signed)
[facility]) Care Management  01/27/2019  BRYLER PESKIN Oct 15, 1955 [id number]  Case Closure- Unsuccessful Outreach Insurance: [employer]na Referral received: 01/08/19 Initial outreach: 01/13/19   Unable to complete [employer] referral high risk screening;  no return call from client after 3 attempts and no response to request to contact this RNCM in unsuccessful outreach letter mailed to client's home on  01/20/19.    Plan: Case closed to [facility] Care Management services as it has been 10 business days since initial outreach attempt.   Bary Richard RN,CCM,CDE [facility] Care Management Coordinator Office Phone [phone] Office Fax [phone]

## 2019-02-09 ENCOUNTER — Other Ambulatory Visit: Payer: Self-pay

## 2019-02-09 ENCOUNTER — Ambulatory Visit (INDEPENDENT_AMBULATORY_CARE_PROVIDER_SITE_OTHER): Payer: 59

## 2019-02-09 DIAGNOSIS — I2510 Atherosclerotic heart disease of native coronary artery without angina pectoris: Secondary | ICD-10-CM | POA: Diagnosis not present

## 2019-02-12 ENCOUNTER — Ambulatory Visit (INDEPENDENT_AMBULATORY_CARE_PROVIDER_SITE_OTHER): Payer: 59 | Admitting: Cardiology

## 2019-02-12 ENCOUNTER — Encounter: Payer: Self-pay | Admitting: Cardiology

## 2019-02-12 ENCOUNTER — Other Ambulatory Visit: Payer: Self-pay

## 2019-02-12 VITALS — BP 118/73 | HR 76 | Ht 71.0 in | Wt 215.0 lb

## 2019-02-12 DIAGNOSIS — I2510 Atherosclerotic heart disease of native coronary artery without angina pectoris: Secondary | ICD-10-CM

## 2019-02-12 DIAGNOSIS — I493 Ventricular premature depolarization: Secondary | ICD-10-CM

## 2019-02-12 NOTE — Progress Notes (Signed)
Follow up visit  Subjective:   Shane Kerr, male    DOB: 05-08-1956, 63 y.o.   MRN: [id number]  I connected with the patient on 02/12/2019 by a video enabled telemedicine application and verified that I am speaking with the correct person using two identifiers.     I discussed the limitations of evaluation and management by telemedicine and the availability of in person appointments. The patient expressed understanding and agreed to proceed.   This visit type was conducted due to national recommendations for restrictions regarding the COVID-19 Pandemic (e.g. social distancing).  This format is felt to be most appropriate for this patient at this time.  All issues noted in this document were discussed and addressed.  No physical exam was performed (except for noted visual exam findings with Tele health visits).  The patient has consented to conduct a Tele health visit and understands insurance will be billed.   Chief Complaint  Patient presents with  . Coronary Artery Disease  . Hypertension  . Results  . Follow-up    HPI   63 y.o. male with no significant cardiac history, was admitted with chest pain. Trop was elevated at 1.07 ng/mL. He was diagnosed with NSTEMI. He underwent balloon angioplasty to diagonal, details below. He also has moderate disease in pLAD.   Patient recently underwent nuclear stress test that showed no ischemia/infarct. Patients is doing well without any cardiac complaints. He continues to have postural dizziness symptoms. He is seeing his PCP for this. Meniere's disease is the leading differential at this time.    Past Medical History:  Diagnosis Date  . Depression   . HTN (hypertension) 01/14/2019  . Hyperlipemia 01/14/2019  . NSTEMI (non-ST elevated myocardial infarction) [facility](HCC)      Past Surgical History:  Procedure Laterality Date  . BACK SURGERY    . CORONARY BALLOON ANGIOPLASTY N/A 01/05/2019   Procedure: CORONARY BALLOON ANGIOPLASTY;  Surgeon:  Elder NegusPatwardhan, Oumar Marcott J, MD;  Location: [facility] INVASIVE CV LAB;  Service: Cardiovascular;  Laterality: N/A;  . KNEE ARTHROSCOPY Left   . LEFT HEART CATH AND CORONARY ANGIOGRAPHY N/A 01/05/2019   Procedure: LEFT HEART CATH AND CORONARY ANGIOGRAPHY;  Surgeon: Elder NegusPatwardhan, Neil Errickson J, MD;  Location: [facility] INVASIVE CV LAB;  Service: Cardiovascular;  Laterality: N/A;  . LUMBAR LAMINECTOMY/DECOMPRESSION MICRODISCECTOMY Left 07/03/2018   Procedure: Left Lumbar five Sacral one Microdiscectomy;  Surgeon: Maeola HarmanStern, Joseph, MD;  [facility];  Service: Neurosurgery;  Laterality: Left;  . LUMBAR MICRODISCECTOMY Left 07/03/2018   Left Lumbar five Sacral one Microdiscectomy  . TONSILLECTOMY  1960s  . TRIGGER FINGER RELEASE Right 06/2014   "middle finger"     Social History   Socioeconomic History  . Marital status: Married    Spouse name: Not on file  . Number of children: Not on file  . Years of education: Not on file  . Highest education level: Not on file  Occupational History  . Not on file  Social Needs  . Financial resource strain: Not on file  . Food insecurity    Worry: Not on file    Inability: Not on file  . Transportation needs    Medical: Not on file    Non-medical: Not on file  Tobacco Use  . Smoking status: Never Smoker  . Smokeless tobacco: Never Used  Substance and Sexual Activity  . Alcohol use: Yes    Comment: 07/03/2018 "might have 1 beer/month; if that"  . Drug use: Never  . Sexual activity: Not  Currently  Lifestyle  . Physical activity    Days per week: Not on file    Minutes per session: Not on file  . Stress: Not on file  Relationships  . Social Musicianconnections    Talks on phone: Not on file    Gets together: Not on file    Attends religious service: Not on file    Active member of club or organization: Not on file    Attends meetings of clubs or organizations: Not on file    Relationship status: Not on file  . Intimate partner violence    Fear of current or ex partner:  Not on file    Emotionally abused: Not on file    Physically abused: Not on file    Forced sexual activity: Not on file  Other Topics Concern  . Not on file  Social History Narrative  . Not on file     Family History  Problem Relation Age of Onset  . Heart disease Mother   . Arthritis Mother   . Diabetes Father      Current Outpatient Medications on File Prior to Visit  Medication Sig Dispense Refill  . aspirin EC 81 MG EC tablet Take 1 tablet (81 mg total) by mouth daily. 90 tablet 2  . atorvastatin (LIPITOR) 80 MG tablet Take 1 tablet (80 mg total) by mouth daily at 6 PM. 90 tablet 2  . carvedilol (COREG) 3.125 MG tablet Take 1 tablet (3.125 mg total) by mouth 2 (two) times daily. 60 tablet 3  . meclizine (ANTIVERT) 12.5 MG tablet Take 1 tablet (12.5 mg total) by mouth 2 (two) times daily as needed for dizziness. 30 tablet 1  . nitroGLYCERIN (NITROSTAT) 0.4 MG SL tablet Place 1 tablet (0.4 mg total) under the tongue every 5 (five) minutes as needed for chest pain (Up to 3 doses in 15 minutes.). 30 tablet 2  . ticagrelor (BRILINTA) 90 MG TABS tablet Take 1 tablet (90 mg total) by mouth 2 (two) times daily. 180 tablet 3   No current facility-administered medications on file prior to visit.     Cardiovascular studies:  Lexiscan Sestamibi Stress Test 02/09/2019: Stress EKG is non-diagnostic, as this is pharmacological stress test. Normal myocardial perfusion. Stress LV EF: 57%.  Low risk study.  EKG 01/14/2019: Sinus rhythm 72 bpm. Frequent PVC's Unchanged compared to previous EKG.  Telemetry: Frequent single PVC's   Coronary angiography 01/05/2019: LM: Normal LAD: Prox 50% focal stenosis. Mid 60-70% diffuse disease Culprit artery D2 with ostial 90% and lateral branch ostial 75% stenosis Successful balloon angioplasty with 20% residual stenosis. LCx: Normal RCA: RPDA 40% disease Normal LVEDP Normal LVEF  Recommendation: Aggressive medical  management. DAPT for 1 year High intensity statin Lipitor 40 mg, metoprolol 25 mg bid. Diet and lifestyle modifications, cardiac rehab   Echocardiogram05/12/2018: 1. The left ventricle has normal systolic function, with an ejection fraction of 60-65%. The cavity size was normal. Left ventricular diastolic Doppler parameters are consistent with impaired relaxation. No evidence of left ventricular regional wall  motion abnormalities. 2. No significant valvular abnormalities.  Recent labs:  Results for Shane McWEGNER, Shane Kerr (MRN [id number]) as of 01/13/2019 21:32  Ref. Range 01/04/2019 14:22 01/04/2019 18:34 01/05/2019 04:47 01/06/2019 04:07  BASIC METABOLIC PANEL Unknown Rpt (A)  Rpt Rpt (A)  Sodium Latest Ref Range: 135 - 145 mmol/L 138  139 138  Potassium Latest Ref Range: 3.5 - 5.1 mmol/L 3.9  3.9 4.0  Chloride Latest Ref Range: 98 -  111 mmol/L 107  107 109  CO2 Latest Ref Range: 22 - 32 mmol/L 24  23 20  (L)  Glucose Latest Ref Range: 70 - 99 mg/dL 109 (H)  94 127 (H)  Mean Plasma Glucose Latest Units: mg/dL   119.76   BUN Latest Ref Range: 8 - 23 mg/dL 15  11 12   Creatinine Latest Ref Range: 0.61 - 1.24 mg/dL 1.07  1.23 1.17  Calcium Latest Ref Range: 8.9 - 10.3 mg/dL 9.3  8.9 8.8 (L)  Anion gap Latest Ref Range: 5 - 15  7  9 9   GFR, Est Non African American Latest Ref Range: >60 mL/min >60  >60 >60  GFR, Est African American Latest Ref Range: >60 mL/min >60  >60 >60  Troponin I Latest Ref Range: <0.03 ng/mL 0.11 (HH) 0.67 (HH) 1.06 (HH)   Total CHOL/HDL Ratio Latest Units: RATIO  2.4    Cholesterol Latest Ref Range: 0 - 200 mg/dL  138    HDL Cholesterol Latest Ref Range: >40 mg/dL  57    LDL (calc) Latest Ref Range: 0 - 99 mg/dL  75    Triglycerides Latest Ref Range: <150 mg/dL  31    VLDL Latest Ref Range: 0 - 40 mg/dL  6     Results for CALLAN, NORDEN (MRN [id number]) as of 01/13/2019 21:32  Ref. Range 01/06/2019 04:07  WBC Latest Ref Range: 4.0 - 10.5 K/uL 10.4  RBC Latest Ref  Range: 4.22 - 5.81 MIL/uL 5.18  Hemoglobin Latest Ref Range: 13.0 - 17.0 g/dL 16.0  HCT Latest Ref Range: 39.0 - 52.0 % 47.1  MCV Latest Ref Range: 80.0 - 100.0 fL 90.9  MCH Latest Ref Range: 26.0 - 34.0 pg 30.9  MCHC Latest Ref Range: 30.0 - 36.0 g/dL 34.0  RDW Latest Ref Range: 11.5 - 15.5 % 12.7  Platelets Latest Ref Range: 150 - 400 K/uL 183  nRBC Latest Ref Range: 0.0 - 0.2 % 0.0     Review of Systems  Constitution: Negative for decreased appetite, malaise/fatigue, weight gain and weight loss.       Sleepiness  HENT: Negative for congestion.   Eyes: Negative for visual disturbance.  Cardiovascular: Negative for chest pain, dyspnea on exertion, leg swelling, palpitations and syncope.  Respiratory: Negative for cough.   Endocrine: Negative for cold intolerance.  Hematologic/Lymphatic: Does not bruise/bleed easily.  Skin: Negative for itching and rash.  Musculoskeletal: Negative for myalgias.  Gastrointestinal: Negative for abdominal pain, nausea and vomiting.  Genitourinary: Negative for dysuria.  Neurological: Positive for dizziness. Negative for weakness.  Psychiatric/Behavioral: The patient is not nervous/anxious.   All other systems reviewed and are negative.        There were no vitals filed for this visit.  Objective:   Physical Exam  Constitutional: He is oriented to person, place, and time. He appears well-developed and well-nourished. No distress.  HENT:  Head: Normocephalic and atraumatic.  Eyes: Pupils are equal, round, and reactive to light. Conjunctivae are normal.  Neck: No JVD present.  Cardiovascular: Normal rate, regular rhythm and intact distal pulses.  Pulmonary/Chest: Effort normal and breath sounds normal. He has no wheezes. He has no rales.  Abdominal: Soft. Bowel sounds are normal. There is no rebound.  Musculoskeletal:        General: No edema.  Lymphadenopathy:    He has no cervical adenopathy.  Neurological: He is alert and oriented to  person, place, and time. No cranial nerve deficit.  Skin: Skin is  warm and dry.  Psychiatric: He has a normal mood and affect.  Nursing note and vitals reviewed.         Assessment & Recommendations:   63 y/o Caucasian male with CAD, recent NSTEMI (01/2019)  CAD: Currently no angina symptoms. NSTEMI in 01/2019 with culprit vessel ostial diagonal treated with balloon angioplasty. He has residual disease in nonculprit pLAD. Lexiscan nuclear stress test 02/2019 with no ischemia/infarction. Continue Aspirin and Brilinta till 01/2020. Continue statin, coreg.   Frequent PVC's: Reportedly, this has been present for a long time. Normal EF. He asymptomatic from PVC's. He did not tolerate metoprolol due to fatigue, but is tolerating coreg 3.125 mg bid well.   Dizziness: Suspected Meniere's disease. Continue follow up with PCP.   Follow up in 6 months w/lipid panel checl.    Elder NegusManish Kerr Jaedynn Bohlken, MD [facility] Pager: [phone] Office: [phone] If no answer Cell [phone]

## 2019-02-17 DIAGNOSIS — H8110 Benign paroxysmal vertigo, unspecified ear: Secondary | ICD-10-CM | POA: Diagnosis not present

## 2019-02-17 DIAGNOSIS — Z1211 Encounter for screening for malignant neoplasm of colon: Secondary | ICD-10-CM | POA: Diagnosis not present

## 2019-02-18 ENCOUNTER — Telehealth (HOSPITAL_COMMUNITY): Payer: Self-pay

## 2019-02-18 NOTE — Telephone Encounter (Signed)
Called and spoke with pt in regards to our Virtual Cardiac Rehab, pt stated he would like to wait until our department opens back up.

## 2019-03-02 DIAGNOSIS — M5126 Other intervertebral disc displacement, lumbar region: Secondary | ICD-10-CM | POA: Diagnosis not present

## 2019-03-02 DIAGNOSIS — M5416 Radiculopathy, lumbar region: Secondary | ICD-10-CM | POA: Diagnosis not present

## 2019-03-02 DIAGNOSIS — M545 Low back pain: Secondary | ICD-10-CM | POA: Diagnosis not present

## 2019-03-02 DIAGNOSIS — Z6830 Body mass index (BMI) 30.0-30.9, adult: Secondary | ICD-10-CM | POA: Diagnosis not present

## 2019-03-02 DIAGNOSIS — I10 Essential (primary) hypertension: Secondary | ICD-10-CM | POA: Diagnosis not present

## 2019-03-12 ENCOUNTER — Telehealth (HOSPITAL_COMMUNITY): Payer: Self-pay

## 2019-03-12 NOTE — Telephone Encounter (Signed)
Pt insurance is active and benefits verified through [employer]. Co-pay $0.00, DED $0.00/$0.00 met, out of pocket $2,500.00/$0.00 met, co-insurance 0%. No pre-authorization required. Mark/A[employer]na, 03/12/2019 @ 500XF, GHW#[id number]

## 2019-03-13 ENCOUNTER — Telehealth (HOSPITAL_COMMUNITY): Payer: Self-pay

## 2019-03-20 ENCOUNTER — Telehealth (HOSPITAL_COMMUNITY): Payer: Self-pay

## 2019-03-23 ENCOUNTER — Telehealth (HOSPITAL_COMMUNITY): Payer: Self-pay

## 2019-03-31 DIAGNOSIS — H903 Sensorineural hearing loss, bilateral: Secondary | ICD-10-CM | POA: Diagnosis not present

## 2019-03-31 DIAGNOSIS — H9319 Tinnitus, unspecified ear: Secondary | ICD-10-CM | POA: Insufficient documentation

## 2019-03-31 DIAGNOSIS — R42 Dizziness and giddiness: Secondary | ICD-10-CM | POA: Diagnosis not present

## 2019-03-31 DIAGNOSIS — H9313 Tinnitus, bilateral: Secondary | ICD-10-CM | POA: Diagnosis not present

## 2019-04-01 ENCOUNTER — Encounter: Payer: Self-pay | Admitting: Rehabilitative and Restorative Service Providers"

## 2019-04-01 ENCOUNTER — Ambulatory Visit: Payer: 59 | Attending: Family Medicine | Admitting: Rehabilitative and Restorative Service Providers"

## 2019-04-01 ENCOUNTER — Other Ambulatory Visit: Payer: Self-pay

## 2019-04-01 DIAGNOSIS — H8111 Benign paroxysmal vertigo, right ear: Secondary | ICD-10-CM | POA: Insufficient documentation

## 2019-04-01 NOTE — Therapy (Signed)
[place] [address] [place], [place], [address] Phone: [phone]   Fax:  [phone]  Physical Therapy Evaluation  Patient Details  Name: BURT PIATEK MRN: [id number] Date of Birth: 01/17/1956 Referring Provider (PT): Aura Dials, MD  CLINIC OPERATION CHANGES: Outpatient Neuro Rehab is open at lower capacity following universal masking, social distancing, and patient screening.  The patient's COVID risk of complications score is 4.   Encounter Date: 04/01/2019  PT End of Session - 04/01/19 0737    Visit Number  1    Number of Visits  4    Date for PT Re-Evaluation  05/16/19    Authorization Type  aetna    PT Start Time  0705    PT Stop Time  0735    PT Time Calculation (min)  30 min    Activity Tolerance  Patient tolerated treatment well    Behavior During Therapy  [facility] for tasks assessed/performed       Past Medical History:  Diagnosis Date  . Depression   . HTN (hypertension) 01/14/2019  . Hyperlipemia 01/14/2019  . NSTEMI (non-ST elevated myocardial infarction) [facility])     Past Surgical History:  Procedure Laterality Date  . BACK SURGERY    . CORONARY BALLOON ANGIOPLASTY N/A 01/05/2019   Procedure: CORONARY BALLOON ANGIOPLASTY;  Surgeon: Nigel Mormon, MD;  Location: [place] CV LAB;  Service: Cardiovascular;  Laterality: N/A;  . KNEE ARTHROSCOPY Left   . LEFT HEART CATH AND CORONARY ANGIOGRAPHY N/A 01/05/2019   Procedure: LEFT HEART CATH AND CORONARY ANGIOGRAPHY;  Surgeon: Nigel Mormon, MD;  Location: [facility] CV LAB;  Service: Cardiovascular;  Laterality: N/A;  . LUMBAR LAMINECTOMY/DECOMPRESSION MICRODISCECTOMY Left 07/03/2018   Procedure: Left Lumbar five Sacral one Microdiscectomy;  Surgeon: Erline Levine, MD;  Location: [place];  Service: Neurosurgery;  Laterality: Left;  . LUMBAR MICRODISCECTOMY Left 07/03/2018   Left Lumbar five Sacral one Microdiscectomy  . TONSILLECTOMY   1960s  . TRIGGER FINGER RELEASE Right 06/2014   "middle finger"    There were no vitals filed for this visit.   Subjective Assessment - 04/01/19 0709    Subjective  The patient had back surgery 07/03/2018.  In January or February, he noticed a "loose headedness" when getting out of bed.  On 01/05/2019, he had a minor heart attack.  He is also developing tinnitus in both sides, he was recently evaluated by ENT Dr. Janace Hoard.   Currently he notices brief episodes of vertigo when getting into and out of bed that lasts for seconds.  He denies imbalance, HA, hearing changes.    Pertinent History  CAD, NSTEMI,  hyperlipidemia. HTN    Patient Stated Goals  "I've been in a downhill slide.  If I could get rid of this dizziness.  I'm starting to fall into a state of depression."  He notes he likes to do work on the house, projects.    Currently in Pain?  No/denies         [facility] PT Assessment - 04/01/19 0715      Assessment   Medical Diagnosis  Vertigo    Referring Provider (PT)  Aura Dials, MD    Onset Date/Surgical Date  --   January, 2020   Prior Therapy  none      Precautions   Precautions  Fall      Restrictions   Weight Bearing Restrictions  No      Balance Screen   Has  the patient fallen in the past 6 months  No    Has the patient had a decrease in activity level because of a fear of falling?   No    Is the patient reluctant to leave their home because of a fear of falling?   No      Home Environment   Living Environment  Private residence    Living Arrangements  Spouse/significant other      Prior Function   Level of Independence  Independent           Vestibular Assessment - 04/01/19 0716      Vestibular Assessment   General Observation  The patient walks into clinic independently without device.       Symptom Behavior   Subjective history of current problem  Began in 2020; dizziness in morning x seconds    Type of Dizziness   Spinning    Frequency of Dizziness   daily    Duration of Dizziness  seconds    Symptom Nature  Positional    Aggravating Factors  Mornings;Turning head quickly    Relieving Factors  Head stationary    Progression of Symptoms  No change since onset    History of similar episodes  none      Oculomotor Exam   Oculomotor Alignment  Normal    Ocular ROM  WFLs    Spontaneous  Absent    Gaze-induced   Absent    Smooth Pursuits  Intact    Saccades  Intact      Positional Testing   Dix-Hallpike  Dix-Hallpike Right;Dix-Hallpike Left    Horizontal Canal Testing  Horizontal Canal Right;Horizontal Canal Left      Dix-Hallpike Right   Dix-Hallpike Right Duration  15 seconds    Dix-Hallpike Right Symptoms  Upbeat, right rotatory nystagmus      Dix-Hallpike Left   Dix-Hallpike Left Duration  none    Dix-Hallpike Left Symptoms  No nystagmus      Horizontal Canal Right   Horizontal Canal Right Duration  none    Horizontal Canal Right Symptoms  Normal      Horizontal Canal Left   Horizontal Canal Left Duration  mild nystagmus viewed in room light    Horizontal Canal Left Symptoms  Ageotrophic          Objective measurements completed on examination: See above findings.       Vestibular Treatment/Exercise - 04/01/19 0730      Vestibular Treatment/Exercise   Vestibular Treatment Provided  Canalith Repositioning    Canalith Repositioning  Epley Manuever Right       EPLEY MANUEVER RIGHT   Number of Reps   1    Overall Response  Symptoms Resolved            PT Education - 04/01/19 0737    Education Details  nature of BPPV    Person(s) Educated  Patient    Methods  Explanation;Demonstration;Handout    Comprehension  Returned demonstration;Verbalized understanding          PT Long Term Goals - 04/01/19 0739      PT LONG TERM GOAL #1   Title  The patient will have negative R dix hallpike testing indicating resolution of BPPV    Time  4    Period  Weeks    Target Date  05/16/19   provided 45 day  target date due to limited appt availability     PT LONG TERM GOAL #2  Title  The patient will be indep with self mgmt of BPPV.    Time  4    Period  Weeks    Target Date  05/16/19             Plan - 04/01/19 0740    Clinical Impression Statement  The patient is a 63 year old male presenting with h/o vertigo beginning earlier this year.  During today's evaluation, he was found to have positive R dix hallpike indicating R posterior canalithiasis.  He responded well to R Epley's maneuver and symptoms were resolved at end of session.  Scheduled one further visit to reassess in 2-3 weeks.    Personal Factors and Comorbidities  Comorbidity 1    Comorbidities  NSTEMI, HTN    Examination-Activity Limitations  Bed Mobility;Locomotion Level    Stability/Clinical Decision Making  Stable/Uncomplicated    Clinical Decision Making  Low    Rehab Potential  Good    PT Frequency  1x / week    PT Duration  4 weeks    PT Treatment/Interventions  Canalith Repostioning;Vestibular;Therapeutic exercise;Balance training;Gait training;Neuromuscular re-education;Patient/family education;Therapeutic activities    PT Next Visit Plan  reassess for BPPV, teach self mgmt if needed    Consulted and Agree with Plan of Care  Patient       Patient will benefit from skilled therapeutic intervention in order to improve the following deficits and impairments:  Dizziness  Visit Diagnosis: 1. BPPV (benign paroxysmal positional vertigo), right        Problem List Patient Active Problem List   Diagnosis Date Noted  . HTN (hypertension) 01/14/2019  . Hyperlipemia 01/14/2019  . PVC (premature ventricular contraction) 01/14/2019  . Coronary artery disease involving native coronary artery of native heart without angina pectoris 01/14/2019  . Dizziness 01/14/2019  . NSTEMI (non-ST elevated myocardial infarction) (HCC) 01/04/2019  . Herniated lumbar disc without myelopathy 07/03/2018    Tahmir Kleckner,  PT 04/01/2019, 7:42 AM  [facility] [facility] [address] [place], [place], [address] Phone: [phone]   Fax:  [phone]  Name: Crecencio McDouglas J Dicke MRN: [id number] Date of Birth: 1956-05-19

## 2019-04-02 ENCOUNTER — Telehealth (HOSPITAL_COMMUNITY): Payer: Self-pay

## 2019-04-02 NOTE — Telephone Encounter (Signed)
Cardiac Rehab Medication Review by a Pharmacist  Does the patient  feel that his/her medications are working for him/her?  Yes, except for the nitroglycerin. He's only taken it once for chest tightness, but it did not provide relief. His wife thought the issue may have been unrelated to his heart.    Has the patient been experiencing any side effects to the medications prescribed?  Yes, he has been having tinnitus recently that has been worsening. His wife said the patient stated it sounds like air coming out of a tire, and he is more symptomatic in the evening. The patient has a history of vertigo after his back surgery and recently completed the Epley maneuver 7/22 to relieve those symptoms.   Does the patient measure his/her own blood pressure or blood glucose at home?  Yes, his last BP was 122/70, highest was 131/84, lowest was 107/78  Does the patient have any problems obtaining medications due to transportation or finances?   no  Understanding of regimen: fair Understanding of indications: fair Potential of compliance: good    Pharmacist comments: His wife seems to have a better understanding of his medications than he does.   Agnes Lawrence, PharmD PGY1 Pharmacy Resident

## 2019-04-07 ENCOUNTER — Other Ambulatory Visit: Payer: Self-pay

## 2019-04-07 ENCOUNTER — Encounter (HOSPITAL_COMMUNITY): Payer: Self-pay

## 2019-04-07 ENCOUNTER — Encounter (HOSPITAL_COMMUNITY)
Admission: RE | Admit: 2019-04-07 | Discharge: 2019-04-07 | Disposition: A | Discharge status: Home / Self Care | Payer: 59 | Source: Ambulatory Visit | Attending: Cardiology | Admitting: Cardiology

## 2019-04-07 VITALS — BP 102/60 | HR 69 | Temp 97.3°F | Ht 71.0 in | Wt 215.8 lb

## 2019-04-07 DIAGNOSIS — R69 Illness, unspecified: Secondary | ICD-10-CM | POA: Diagnosis not present

## 2019-04-07 DIAGNOSIS — H9319 Tinnitus, unspecified ear: Secondary | ICD-10-CM | POA: Diagnosis not present

## 2019-04-07 DIAGNOSIS — I214 Non-ST elevation (NSTEMI) myocardial infarction: Secondary | ICD-10-CM

## 2019-04-07 DIAGNOSIS — Z955 Presence of coronary angioplasty implant and graft: Secondary | ICD-10-CM

## 2019-04-07 HISTORY — DX: Atherosclerotic heart disease of native coronary artery without angina pectoris: I25.10

## 2019-04-07 NOTE — Progress Notes (Signed)
Cardiac Individual Treatment Plan  Patient Details  Name: Shane Kerr MRN: [id number] Date of Birth: February 03, 1956 Referring Provider:     CARDIAC REHAB PHASE II ORIENTATION [facility]07[facility]  Referring Provider  Truett MainlandPatwardhan, Manish, MD      Initial Encounter Date:    CARDIAC REHAB PHASE [facility]  Date  04/07/19      Visit Diagnosis: 01/04/19 NSTEMI, 01/05/19 S/P PTCA Dx  Patient's Home Medications on Admission:  Current Outpatient Medications:  .  aspirin EC 81 MG EC tablet, Take 1 tablet (81 mg total) by mouth daily., Disp: 90 tablet, Rfl: 2 .  atorvastatin (LIPITOR) 80 MG tablet, Take 1 tablet (80 mg total) by mouth daily at 6 PM., Disp: 90 tablet, Rfl: 2 .  carvedilol (COREG) 3.125 MG tablet, Take 1 tablet (3.125 mg total) by mouth 2 (two) times daily., Disp: 60 tablet, Rfl: 3 .  nitroGLYCERIN (NITROSTAT) 0.4 MG SL tablet, Place 1 tablet (0.4 mg total) under the tongue every 5 (five) minutes as needed for chest pain (Up to 3 doses in 15 minutes.)., Disp: 30 tablet, Rfl: 2 .  ticagrelor (BRILINTA) 90 MG TABS tablet, Take 1 tablet (90 mg total) by mouth 2 (two) times daily., Disp: 180 tablet, Rfl: 3 .  meclizine (ANTIVERT) 12.5 MG tablet, Take 1 tablet (12.5 mg total) by mouth 2 (two) times daily as needed for dizziness. (Patient not taking: Reported on 04/01/2019), Disp: 30 tablet, Rfl: 1 .  methocarbamol (ROBAXIN) 500 MG tablet, Take 500 mg by mouth every 6 (six) hours as needed for muscle spasms (Patient has only needed to take one in the last couple of months.). , Disp: , Rfl:   Past Medical History: Past Medical History:  Diagnosis Date  . Coronary artery disease   . Depression   . HTN (hypertension) 01/14/2019  . Hyperlipemia 01/14/2019  . NSTEMI (non-ST elevated myocardial infarction) (HCC)     Tobacco Use: Social History   Tobacco Use  Smoking Status Never Smoker   Smokeless Tobacco Never Used    Labs: Recent Review Flowsheet Data    Labs for ITP Cardiac and Pulmonary Rehab Latest Ref Rng & Units 12/09/2012 01/04/2019 01/05/2019   Cholestrol 0 - 200 mg/dL [id number] -   LDLCALC 0 - 99 mg/dL 87 75 -   HDL >30>40 mg/dL 51 57 -   Trlycerides <865<150 mg/dL 73 31 -   Hemoglobin H8IA1c 4.8 - 5.6 % - - 5.8(H)      Capillary Blood Glucose: No results found for: GLUCAP   Exercise Target Goals: Exercise Program Goal: Individual exercise prescription set using results from initial 6 min walk test and THRR while considering  patient's activity barriers and safety.   Exercise Prescription Goal: Starting with aerobic activity 30 plus minutes a day, 3 days per week for initial exercise prescription. Provide home exercise prescription and guidelines that participant acknowledges understanding prior to discharge.  Activity Barriers & Risk Stratification: Activity Barriers & Cardiac Risk Stratification - 04/07/19 1212      Activity Barriers & Cardiac Risk Stratification   Activity Barriers  Other (comment)    Comments  Prior back surgery, left knee arthroscopy, vertigo    Cardiac Risk Stratification  High       6 Minute Walk: 6 Minute Walk    Row Name 04/07/19 0800         6 Minute Walk  Phase  Initial     Distance  1575 feet     Walk Time  6 minutes     # of Rest Breaks  0     MPH  2.98     METS  3.44     RPE  11     Perceived Dyspnea   0     VO2 Peak  12.04     Symptoms  No     Resting HR  69 bpm     Resting BP  102/60     Resting Oxygen Saturation   97 %     Exercise Oxygen Saturation  during 6 min walk  98 %     Max Ex. HR  91 bpm     Max Ex. BP  120/78     2 Minute Post BP  102/78        Oxygen Initial Assessment:   Oxygen Re-Evaluation:   Oxygen Discharge (Final Oxygen Re-Evaluation):   Initial Exercise Prescription: Initial Exercise Prescription - 04/07/19 1200      Date of Initial Exercise RX and Referring Provider   Date   04/07/19    Referring Provider  Truett Mainland, MD    Expected Discharge Date  05/22/19      Recumbant Bike   Level  3    Watts  45    Minutes  15    METs  3.46      NuStep   Level  3    SPM  85    Minutes  15    METs  3      Prescription Details   Frequency (times per week)  3    Duration  Progress to 30 minutes of continuous aerobic without signs/symptoms of physical distress      Intensity   THRR 40-80% of Max Heartrate  62-125    Ratings of Perceived Exertion  11-13    Perceived Dyspnea  0-4      Progression   Progression  Continue to progress workloads to maintain intensity without signs/symptoms of physical distress.      Resistance Training   Training Prescription  Yes    Weight  5lbs    Reps  10-15       Perform Capillary Blood Glucose checks as needed.  Exercise Prescription Changes:   Exercise Comments:   Exercise Goals and Review:  Exercise Goals    Row Name 04/07/19 0726             Exercise Goals   Increase Physical Activity  Yes       Intervention  Provide advice, education, support and counseling about physical activity/exercise needs.;Develop an individualized exercise prescription for aerobic and resistive training based on initial evaluation findings, risk stratification, comorbidities and participant's personal goals.       Expected Outcomes  Short Term: Attend rehab on a regular basis to increase amount of physical activity.;Long Term: Exercising regularly at least 3-5 days a week.;Long Term: Add in home exercise to make exercise part of routine and to increase amount of physical activity.       Increase Strength and Stamina  Yes       Intervention  Provide advice, education, support and counseling about physical activity/exercise needs.;Develop an individualized exercise prescription for aerobic and resistive training based on initial evaluation findings, risk stratification, comorbidities and participant's personal goals.        Expected Outcomes  Short Term: Increase workloads from initial exercise prescription  for resistance, speed, and METs.;Short Term: Perform resistance training exercises routinely during rehab and add in resistance training at home;Long Term: Improve cardiorespiratory fitness, muscular endurance and strength as measured by increased METs and functional capacity (6MWT)       Able to understand and use rate of perceived exertion (RPE) scale  Yes       Intervention  Provide education and explanation on how to use RPE scale       Expected Outcomes  Short Term: Able to use RPE daily in rehab to express subjective intensity level;Long Term:  Able to use RPE to guide intensity level when exercising independently       Knowledge and understanding of Target Heart Rate Range (THRR)  Yes       Intervention  Provide education and explanation of THRR including how the numbers were predicted and where they are located for reference       Expected Outcomes  Short Term: Able to state/look up THRR;Long Term: Able to use THRR to govern intensity when exercising independently;Short Term: Able to use daily as guideline for intensity in rehab       Able to check pulse independently  Yes       Intervention  Provide education and demonstration on how to check pulse in carotid and radial arteries.;Review the importance of being able to check your own pulse for safety during independent exercise       Expected Outcomes  Short Term: Able to explain why pulse checking is important during independent exercise;Long Term: Able to check pulse independently and accurately       Understanding of Exercise Prescription  Yes       Intervention  Provide education, explanation, and written materials on patient's individual exercise prescription       Expected Outcomes  Short Term: Able to explain program exercise prescription;Long Term: Able to explain home exercise prescription to exercise independently          Exercise Goals  Re-Evaluation :    Discharge Exercise Prescription (Final Exercise Prescription Changes):   Nutrition:  Target Goals: Understanding of nutrition guidelines, daily intake of sodium 1500mg , cholesterol 200mg , calories 30% from fat and 7% or less from saturated fats, daily to have 5 or more servings of fruits and vegetables.  Biometrics: Pre Biometrics - 04/07/19 0721      Pre Biometrics   Height  5\' 11"  (1.803 m)    Weight  97.9 kg    Waist Circumference  40.75 inches    Hip Circumference  41.75 inches    Waist to Hip Ratio  0.98 %    BMI (Calculated)  30.12    Triceps Skinfold  20 mm    % Body Fat  29.5 %    Grip Strength  39.5 kg    Flexibility  9 in    Single Leg Stand  30 seconds        Nutrition Therapy Plan and Nutrition Goals:   Nutrition Assessments:   Nutrition Goals Re-Evaluation:   Nutrition Goals Discharge (Final Nutrition Goals Re-Evaluation):   Psychosocial: Target Goals: Acknowledge presence or absence of significant depression and/or stress, maximize coping skills, provide positive support system. Participant is able to verbalize types and ability to use techniques and skills needed for reducing stress and depression.  Initial Review & Psychosocial Screening: Initial Psych Review & Screening - 04/07/19 1341      Initial Review   Current issues with  Current Depression  Family Dynamics   Good Support System?  Yes   Shane Kerr has his wife for support   Comments  Shane Kerr says he has been depressed due to hius recent health issues and family. Shane Kerr wife helps to take care of her elderly parents.      Barriers   Psychosocial barriers to participate in program  The patient should benefit from training in stress management and relaxation.      Screening Interventions   Interventions  Encouraged to exercise;To provide support and resources with identified psychosocial needs    Expected Outcomes  Short Term goal: Utilizing psychosocial counselor, staff  and physician to assist with identification of specific Stressors or current issues interfering with healing process. Setting desired goal for each stressor or current issue identified.;Long Term Goal: Stressors or current issues are controlled or eliminated.;Short Term goal: Identification and review with participant of any Quality of Life or Depression concerns found by scoring the questionnaire.;Long Term goal: The participant improves quality of Life and PHQ9 Scores as seen by post scores and/or verbalization of changes       Quality of Life Scores: Quality of Life - 04/07/19 1227      Quality of Life   Select  Quality of Life      Quality of Life Scores   Health/Function Pre  13.2 %    Socioeconomic Pre  24 %    Psych/Spiritual Pre  20 %    Family Pre  18 %    GLOBAL Pre  17.65 %      Scores of 19 and below usually indicate a poorer quality of life in these areas.  A difference of  2-3 points is a clinically meaningful difference.  A difference of 2-3 points in the total score of the Quality of Life Index has been associated with significant improvement in overall quality of life, self-image, physical symptoms, and general health in studies assessing change in quality of life.  PHQ-9: Recent Review Flowsheet Data    Depression screen [facility]/9 04/07/2019 11/25/2015   Decreased Interest 0 0   Down, Depressed, Hopeless 1  1   PHQ - 2 Score 1 1     Interpretation of Total Score  Total Score Depression Severity:  1-4 = Minimal depression, 5-9 = Mild depression, 10-14 = Moderate depression, 15-19 = Moderately severe depression, 20-27 = Severe depression   Psychosocial Evaluation and Intervention:   Psychosocial Re-Evaluation:   Psychosocial Discharge (Final Psychosocial Re-Evaluation):   Vocational Rehabilitation: Provide vocational rehab assistance to qualifying candidates.   Vocational Rehab Evaluation & Intervention: Vocational Rehab - 04/07/19 1347      Initial  Vocational Rehab Evaluation & Intervention   Assessment shows need for Vocational Rehabilitation  No   Shane Kerr is currently working and does not need vocational rehab at this time.      Education: Education Goals: Education classes will be provided on a weekly basis, covering required topics. Participant will state understanding/return demonstration of topics presented.  Learning Barriers/Preferences: Learning Barriers/Preferences - 04/07/19 1228      Learning Barriers/Preferences   Learning Barriers  None    Learning Preferences  Skilled Demonstration;Verbal Instruction;Written Material       Education Topics: Hypertension, Hypertension Reduction -Define heart disease and high blood pressure. Discus how high blood pressure affects the body and ways to reduce high blood pressure.   Exercise and Your Heart -Discuss why it is important to exercise, the FITT principles of exercise, normal and abnormal responses to exercise,  and how to exercise safely.   Angina -Discuss definition of angina, causes of angina, treatment of angina, and how to decrease risk of having angina.   Cardiac Medications -Review what the following cardiac medications are used for, how they affect the body, and side effects that may occur when taking the medications.  Medications include Aspirin, Beta blockers, calcium channel blockers, ACE Inhibitors, angiotensin receptor blockers, diuretics, digoxin, and antihyperlipidemics.   Congestive Heart Failure -Discuss the definition of CHF, how to live with CHF, the signs and symptoms of CHF, and how keep track of weight and sodium intake.   Heart Disease and Intimacy -Discus the effect sexual activity has on the heart, how changes occur during intimacy as we age, and safety during sexual activity.   Smoking Cessation / COPD -Discuss different methods to quit smoking, the health benefits of quitting smoking, and the definition of COPD.   Nutrition I: Fats  -Discuss the types of cholesterol, what cholesterol does to the heart, and how cholesterol levels can be controlled.   Nutrition II: Labels -Discuss the different components of food labels and how to read food label   Heart Parts/Heart Disease and PAD -Discuss the anatomy of the heart, the pathway of blood circulation through the heart, and these are affected by heart disease.   Stress I: Signs and Symptoms -Discuss the causes of stress, how stress may lead to anxiety and depression, and ways to limit stress.   Stress II: Relaxation -Discuss different types of relaxation techniques to limit stress.   Warning Signs of Stroke / TIA -Discuss definition of a stroke, what the signs and symptoms are of a stroke, and how to identify when someone is having stroke.   Knowledge Questionnaire Score: Knowledge Questionnaire Score - 04/07/19 1230      Knowledge Questionnaire Score   Pre Score  20/24       Core Components/Risk Factors/Patient Goals at Admission: Personal Goals and Risk Factors at Admission - 04/07/19 1348      Core Components/Risk Factors/Patient Goals on Admission    Weight Management  Yes;Obesity    Expected Outcomes  Short Term: Continue to assess and modify interventions until short term weight is achieved;Long Term: Adherence to nutrition and physical activity/exercise program aimed toward attainment of established weight goal    Hypertension  Yes    Intervention  Provide education on lifestyle modifcations including regular physical activity/exercise, weight management, moderate sodium restriction and increased consumption of fresh fruit, vegetables, and low fat dairy, alcohol moderation, and smoking cessation.;Monitor prescription use compliance.    Lipids  Yes    Intervention  Provide education and support for participant on nutrition & aerobic/resistive exercise along with prescribed medications to achieve LDL 70mg , HDL >40mg .    Expected Outcomes  Short Term:  Participant states understanding of desired cholesterol values and is compliant with medications prescribed. Participant is following exercise prescription and nutrition guidelines.;Long Term: Cholesterol controlled with medications as prescribed, with individualized exercise RX and with personalized nutrition plan. Value goals: LDL < 70mg , HDL > 40 mg.    Stress  Yes    Intervention  Offer individual and/or small group education and counseling on adjustment to heart disease, stress management and health-related lifestyle change. Teach and support self-help strategies.;Refer participants experiencing significant psychosocial distress to appropriate mental health specialists for further evaluation and treatment. When possible, include family members and significant others in education/counseling sessions.    Expected Outcomes  Short Term: Participant demonstrates changes in health-related behavior, relaxation  and other stress management skills, ability to obtain effective social support, and compliance with psychotropic medications if prescribed.;Long Term: Emotional wellbeing is indicated by absence of clinically significant psychosocial distress or social isolation.    Personal Goal Other  Yes    Personal Goal  Patient would like a better understanding of his health in general and in regards to cardiac health.    Intervention  Provide education on heart disease and risk factor modification to help patient better know his risk factors and how to modify those risk factors.    Expected Outcomes  Patient will understand his risk factors for heart disease. Patient will make necessary lifestyle changes to help reduce risk for heart disease.       Core Components/Risk Factors/Patient Goals Review:    Core Components/Risk Factors/Patient Goals at Discharge (Final Review):    ITP Comments: ITP Comments    Row Name 04/07/19 0715           ITP Comments  Medical Director- Dr. Armanda Magicraci Turner, MD           Comments: Shane Kerr attended orientation on 04/07/2019 to review rules and guidelines for program.  Completed 6 minute walk test, Intitial ITP, and exercise prescription.  VSS. Telemetry-Sinus Rhythm.  Asymptomatic. Safety measures and social distancing in place per CDC guidelines.Shane Kerr voiced that he has felt depressed since his hospitalization and has experienced tinnitus. Patient's primary care Dr Titus MouldMeyer's office contacted. Appointment scheduled for today at 11:00 to discuss.Gladstone LighterMaria Oriah Leinweber, RN,BSN 04/07/2019 2:01 PM

## 2019-04-07 NOTE — Progress Notes (Signed)
Patient says he has been depressed recently. Appointment made for patient to follow up with primary care physician to discuss depression today at 11:00. Will continue to follow and provide support as needed.

## 2019-04-08 ENCOUNTER — Telehealth: Payer: Self-pay

## 2019-04-08 NOTE — Telephone Encounter (Signed)
Telephone encounter:  Reason for call: Patient called and asked if his Aspirin could be causing his tinnitus, he has been told so.  Usual provider: [facility] Last office visit: 02/12/19  Next office visit: n/a   Last hospitalization: n/a  Current Outpatient Medications on File Prior to Visit  Medication Sig Dispense Refill  . aspirin EC 81 MG EC tablet Take 1 tablet (81 mg total) by mouth daily. 90 tablet 2  . atorvastatin (LIPITOR) 80 MG tablet Take 1 tablet (80 mg total) by mouth daily at 6 PM. 90 tablet 2  . carvedilol (COREG) 3.125 MG tablet Take 1 tablet (3.125 mg total) by mouth 2 (two) times daily. 60 tablet 3  . meclizine (ANTIVERT) 12.5 MG tablet Take 1 tablet (12.5 mg total) by mouth 2 (two) times daily as needed for dizziness. (Patient not taking: Reported on 04/01/2019) 30 tablet 1  . methocarbamol (ROBAXIN) 500 MG tablet Take 500 mg by mouth every 6 (six) hours as needed for muscle spasms (Patient has only needed to take one in the last couple of months.).     Marland Kitchen nitroGLYCERIN (NITROSTAT) 0.4 MG SL tablet Place 1 tablet (0.4 mg total) under the tongue every 5 (five) minutes as needed for chest pain (Up to 3 doses in 15 minutes.). 30 tablet 2  . ticagrelor (BRILINTA) 90 MG TABS tablet Take 1 tablet (90 mg total) by mouth 2 (two) times daily. 180 tablet 3   No current facility-administered medications on file prior to visit.

## 2019-04-09 NOTE — Telephone Encounter (Signed)
Lvm for pt saying to stop Aspirin and let us know if it helps w/ the tinnitus

## 2019-04-09 NOTE — Telephone Encounter (Signed)
It is possible. We can stop Aspririn and Continue Brilinta and see if tinnitus improves.   Reference: Based on some of the recent data (TWILIGHT study), stopping Aspirin after three months of DAPT may be safe.  Thanks MJP

## 2019-04-13 ENCOUNTER — Other Ambulatory Visit: Payer: Self-pay

## 2019-04-13 ENCOUNTER — Encounter (HOSPITAL_COMMUNITY)
Admission: RE | Admit: 2019-04-13 | Discharge: 2019-04-13 | Disposition: A | Discharge status: Home / Self Care | Payer: 59 | Source: Ambulatory Visit | Attending: Cardiology | Admitting: Cardiology

## 2019-04-13 DIAGNOSIS — I214 Non-ST elevation (NSTEMI) myocardial infarction: Secondary | ICD-10-CM

## 2019-04-13 DIAGNOSIS — Z955 Presence of coronary angioplasty implant and graft: Secondary | ICD-10-CM

## 2019-04-13 NOTE — Progress Notes (Signed)
Daily Session Note  Patient Details  Name: Shane Kerr MRN: [id number] Date of Birth: [birth year] Referring Provider:     CARDIAC REHAB PHASE II ORIENTATION from 04/07/2019 in [facility]  Referring Provider  Vernell Leep, MD      Encounter Date: 04/13/2019  Check In: Session Check In - 04/13/19 1057      Check-In   Supervising physician immediately available to respond to emergencies  [facility] immediately available    Physician(s)  Dr. Benny Lennert    Location  [facility]    Staff Present  Maurice Small, RN, Toma Deiters, RN, Bjorn Loser, MS, Exercise Physiologist;Brittany Durene Fruits, BS, ACSM CEP, Exercise Physiologist    Virtual Visit  No    Medication changes reported      No    Fall or balance concerns reported     No    Tobacco Cessation  No Change    Warm-up and Cool-down  Performed on first and last piece of equipment    Resistance Training Performed  Yes    VAD Patient?  No    PAD/SET Patient?  No      Pain Assessment   Currently in Pain?  No/denies       Capillary Blood Glucose: No results found for this or any previous visit (from the past 24 hour(s)).    Social History   Tobacco Use  Smoking Status Never Smoker  Smokeless Tobacco Never Used    Goals Met:  Exercise tolerated well  Goals Unmet:  Not Applicable  Comments: Pt started cardiac rehab today.  Pt tolerated light exercise without difficulty. VSS, telemetry-SR, asymptomatic.  Medication list reconciled. Pt denies barriers to medicaiton compliance.  PSYCHOSOCIAL ASSESSMENT:  PHQ-1. Pt reports depression related his current medication conditions.  Appointment made with PCP.  Pt oriented to exercise equipment and routine.    Understanding verbalized.    Dr. Fransico Him is Medical Director for Cardiac Rehab at [facility].

## 2019-04-15 ENCOUNTER — Other Ambulatory Visit: Payer: Self-pay

## 2019-04-15 ENCOUNTER — Encounter (HOSPITAL_COMMUNITY)
Admission: RE | Admit: 2019-04-15 | Discharge: 2019-04-15 | Disposition: A | Discharge status: Home / Self Care | Payer: 59 | Source: Ambulatory Visit | Attending: Cardiology | Admitting: Cardiology

## 2019-04-15 DIAGNOSIS — Z955 Presence of coronary angioplasty implant and graft: Secondary | ICD-10-CM | POA: Diagnosis not present

## 2019-04-15 DIAGNOSIS — I214 Non-ST elevation (NSTEMI) myocardial infarction: Secondary | ICD-10-CM

## 2019-04-17 ENCOUNTER — Encounter (HOSPITAL_COMMUNITY)
Admission: RE | Admit: 2019-04-17 | Discharge: 2019-04-17 | Disposition: A | Discharge status: Home / Self Care | Payer: 59 | Source: Ambulatory Visit | Attending: Cardiology | Admitting: Cardiology

## 2019-04-17 ENCOUNTER — Other Ambulatory Visit: Payer: Self-pay

## 2019-04-17 DIAGNOSIS — I214 Non-ST elevation (NSTEMI) myocardial infarction: Secondary | ICD-10-CM

## 2019-04-17 DIAGNOSIS — Z955 Presence of coronary angioplasty implant and graft: Secondary | ICD-10-CM | POA: Diagnosis not present

## 2019-04-20 ENCOUNTER — Other Ambulatory Visit: Payer: Self-pay

## 2019-04-20 ENCOUNTER — Encounter (HOSPITAL_COMMUNITY)
Admission: RE | Admit: 2019-04-20 | Discharge: 2019-04-20 | Disposition: A | Discharge status: Home / Self Care | Payer: 59 | Source: Ambulatory Visit | Attending: Cardiology | Admitting: Cardiology

## 2019-04-20 DIAGNOSIS — I214 Non-ST elevation (NSTEMI) myocardial infarction: Secondary | ICD-10-CM

## 2019-04-20 DIAGNOSIS — Z955 Presence of coronary angioplasty implant and graft: Secondary | ICD-10-CM | POA: Diagnosis not present

## 2019-04-21 NOTE — Progress Notes (Signed)
Reviewed home exercise guidelines with patient including endpoints, temperature precautions, target heart rate and rate of perceived exertion. Pt plans to use elliptical machine as his mode of home exercise. Pt voices understanding of instructions given.  Sol Passer, MS, ACSM CEP

## 2019-04-22 ENCOUNTER — Encounter (HOSPITAL_COMMUNITY)
Admission: RE | Admit: 2019-04-22 | Discharge: 2019-04-22 | Disposition: A | Discharge status: Home / Self Care | Payer: 59 | Source: Ambulatory Visit | Attending: Cardiology | Admitting: Cardiology

## 2019-04-22 ENCOUNTER — Other Ambulatory Visit: Payer: Self-pay

## 2019-04-22 DIAGNOSIS — Z955 Presence of coronary angioplasty implant and graft: Secondary | ICD-10-CM | POA: Diagnosis not present

## 2019-04-22 DIAGNOSIS — I214 Non-ST elevation (NSTEMI) myocardial infarction: Secondary | ICD-10-CM | POA: Diagnosis not present

## 2019-04-23 NOTE — Progress Notes (Addendum)
Cardiac Individual Treatment Plan  Patient Details  Name: Shane Kerr MRN: [id number] Date of Birth: Jul 03, 1956 Referring Provider:     CARDIAC REHAB PHASE II ORIENTATION from 04/07/2019 in [facility]  Referring Provider  Vernell Leep, MD      Initial Encounter Date:    CARDIAC REHAB PHASE II ORIENTATION from 04/07/2019 in [facility]  Date  04/07/19      Visit Diagnosis: 01/04/19 NSTEMI  01/05/19 PTCA/Dx  Patient's Home Medications on Admission:  Current Outpatient Medications:    aspirin EC 81 MG EC tablet, Take 1 tablet (81 mg total) by mouth daily., Disp: 90 tablet, Rfl: 2   atorvastatin (LIPITOR) 80 MG tablet, Take 1 tablet (80 mg total) by mouth daily at 6 PM., Disp: 90 tablet, Rfl: 2   carvedilol (COREG) 3.125 MG tablet, Take 1 tablet (3.125 mg total) by mouth 2 (two) times daily., Disp: 60 tablet, Rfl: 3   meclizine (ANTIVERT) 12.5 MG tablet, Take 1 tablet (12.5 mg total) by mouth 2 (two) times daily as needed for dizziness. (Patient not taking: Reported on 04/01/2019), Disp: 30 tablet, Rfl: 1   methocarbamol (ROBAXIN) 500 MG tablet, Take 500 mg by mouth every 6 (six) hours as needed for muscle spasms (Patient has only needed to take one in the last couple of months.). , Disp: , Rfl:    nitroGLYCERIN (NITROSTAT) 0.4 MG SL tablet, Place 1 tablet (0.4 mg total) under the tongue every 5 (five) minutes as needed for chest pain (Up to 3 doses in 15 minutes.)., Disp: 30 tablet, Rfl: 2   ticagrelor (BRILINTA) 90 MG TABS tablet, Take 1 tablet (90 mg total) by mouth 2 (two) times daily., Disp: 180 tablet, Rfl: 3  Past Medical History: Past Medical History:  Diagnosis Date   Coronary artery disease    Depression    HTN (hypertension) 01/14/2019   Hyperlipemia 01/14/2019   NSTEMI (non-ST elevated myocardial infarction) (HCC)     Tobacco Use: Social History   Tobacco Use  Smoking Status Never Smoker    Smokeless Tobacco Never Used    Labs: Recent Review Flowsheet Data    Labs for ITP Cardiac and Pulmonary Rehab Latest Ref Rng & Units 12/09/2012 01/04/2019 01/05/2019   Cholestrol 0 - 200 mg/dL 153 138 -   LDLCALC 0 - 99 mg/dL 87 75 -   HDL >40 mg/dL 51 57 -   Trlycerides <150 mg/dL 73 31 -   Hemoglobin A1c 4.8 - 5.6 % - - 5.8(H)      Capillary Blood Glucose: No results found for: GLUCAP   Exercise Target Goals: Exercise Program Goal: Individual exercise prescription set using results from initial 6 min walk test and THRR while considering  patients activity barriers and safety.   Exercise Prescription Goal: Initial exercise prescription builds to 30-45 minutes a day of aerobic activity, 2-3 days per week.  Home exercise guidelines will be given to patient during program as part of exercise prescription that the participant will acknowledge.  Activity Barriers & Risk Stratification: Activity Barriers & Cardiac Risk Stratification - 04/07/19 1212      Activity Barriers & Cardiac Risk Stratification   Activity Barriers  Other (comment)    Comments  Prior back surgery, left knee arthroscopy, vertigo    Cardiac Risk Stratification  High       6 Minute Walk: 6 Minute Walk    Row Name 04/07/19 0800  6 Minute Walk   Phase  Initial     Distance  1575 feet     Walk Time  6 minutes     # of Rest Breaks  0     MPH  2.98     METS  3.44     RPE  11     Perceived Dyspnea   0     VO2 Peak  12.04     Symptoms  No     Resting HR  69 bpm     Resting BP  102/60     Resting Oxygen Saturation   97 %     Exercise Oxygen Saturation  during 6 min walk  98 %     Max Ex. HR  91 bpm     Max Ex. BP  120/78     2 Minute Post BP  102/78        Oxygen Initial Assessment:   Oxygen Re-Evaluation:   Oxygen Discharge (Final Oxygen Re-Evaluation):   Initial Exercise Prescription: Initial Exercise Prescription - 04/07/19 1200      Date of Initial Exercise RX and Referring  Provider   Date  04/07/19    Referring Provider  Vernell Leep, MD    Expected Discharge Date  05/22/19      Recumbant Bike   Level  3    Watts  45    Minutes  15    METs  3.46      NuStep   Level  3    SPM  85    Minutes  15    METs  3      Prescription Details   Frequency (times per week)  3    Duration  Progress to 30 minutes of continuous aerobic without signs/symptoms of physical distress      Intensity   THRR 40-80% of Max Heartrate  62-125    Ratings of Perceived Exertion  11-13    Perceived Dyspnea  0-4      Progression   Progression  Continue to progress workloads to maintain intensity without signs/symptoms of physical distress.      Resistance Training   Training Prescription  Yes    Weight  5lbs    Reps  10-15       Perform Capillary Blood Glucose checks as needed.  Exercise Prescription Changes: Exercise Prescription Changes    Row Name 04/13/19 1500 04/20/19 1111           Response to Exercise   Blood Pressure (Admit)  128/76  108/72      Blood Pressure (Exercise)  138/82  120/68      Blood Pressure (Exit)  130/62  108/64      Heart Rate (Admit)  70 bpm  75 bpm      Heart Rate (Exercise)  97 bpm  93 bpm      Heart Rate (Exit)  71 bpm  84 bpm      Rating of Perceived Exertion (Exercise)  11  13      Symptoms  None  None      Comments  Pt first day of exercise.   --      Duration  Continue with 30 min of aerobic exercise without signs/symptoms of physical distress.  Continue with 30 min of aerobic exercise without signs/symptoms of physical distress.      Intensity  THRR unchanged  THRR unchanged        Progression   Progression  Continue to progress workloads to maintain intensity without signs/symptoms of physical distress.  Continue to progress workloads to maintain intensity without signs/symptoms of physical distress.      Average METs  3.1  3.7        Resistance Training   Training Prescription  Yes  Yes      Weight  5lbs  5lbs       Reps  10-15  10-15      Time  10 Minutes  10 Minutes        Recumbant Bike   Level  3  3 Upright Scifit bike      Watts  45  --      Minutes  15  15      METs  3.6  4.7        NuStep   Level  3  3      SPM  85  85      Minutes  15  15      METs  2.5  2.6        Home Exercise Plan   Plans to continue exercise at  --  Home (comment) Elliptical, treadmill at home.      Frequency  --  Add 2 additional days to program exercise sessions.      Initial Home Exercises Provided  --  04/20/19         Exercise Comments: Exercise Comments    Row Name 04/13/19 1526 04/20/19 1119         Exercise Comments  Pt first day of exercise. Pt tolerated exercise well.  Reviewed home exercise guidelines, METs, and goals with patient.         Exercise Goals and Review: Exercise Goals    Row Name 04/07/19 0726             Exercise Goals   Increase Physical Activity  Yes       Intervention  Provide advice, education, support and counseling about physical activity/exercise needs.;Develop an individualized exercise prescription for aerobic and resistive training based on initial evaluation findings, risk stratification, comorbidities and participant's personal goals.       Expected Outcomes  Short Term: Attend rehab on a regular basis to increase amount of physical activity.;Long Term: Exercising regularly at least 3-5 days a week.;Long Term: Add in home exercise to make exercise part of routine and to increase amount of physical activity.       Increase Strength and Stamina  Yes       Intervention  Provide advice, education, support and counseling about physical activity/exercise needs.;Develop an individualized exercise prescription for aerobic and resistive training based on initial evaluation findings, risk stratification, comorbidities and participant's personal goals.       Expected Outcomes  Short Term: Increase workloads from initial exercise prescription for resistance, speed, and  METs.;Short Term: Perform resistance training exercises routinely during rehab and add in resistance training at home;Long Term: Improve cardiorespiratory fitness, muscular endurance and strength as measured by increased METs and functional capacity (6MWT)       Able to understand and use rate of perceived exertion (RPE) scale  Yes       Intervention  Provide education and explanation on how to use RPE scale       Expected Outcomes  Short Term: Able to use RPE daily in rehab to express subjective intensity level;Long Term:  Able to use RPE to guide intensity level when exercising independently  Knowledge and understanding of Target Heart Rate Range (THRR)  Yes       Intervention  Provide education and explanation of THRR including how the numbers were predicted and where they are located for reference       Expected Outcomes  Short Term: Able to state/look up THRR;Long Term: Able to use THRR to govern intensity when exercising independently;Short Term: Able to use daily as guideline for intensity in rehab       Able to check pulse independently  Yes       Intervention  Provide education and demonstration on how to check pulse in carotid and radial arteries.;Review the importance of being able to check your own pulse for safety during independent exercise       Expected Outcomes  Short Term: Able to explain why pulse checking is important during independent exercise;Long Term: Able to check pulse independently and accurately       Understanding of Exercise Prescription  Yes       Intervention  Provide education, explanation, and written materials on patient's individual exercise prescription       Expected Outcomes  Short Term: Able to explain program exercise prescription;Long Term: Able to explain home exercise prescription to exercise independently          Exercise Goals Re-Evaluation : Exercise Goals Re-Evaluation    [place] Name 04/13/19 1522 04/20/19 1119           Exercise Goal  Re-Evaluation   Exercise Goals Review  Increase Physical Activity;Understanding of Exercise Prescription;Increase Strength and Stamina;Knowledge and understanding of Target Heart Rate Range (THRR);Able to understand and use rate of perceived exertion (RPE) scale  Increase Physical Activity;Understanding of Exercise Prescription;Increase Strength and Stamina;Knowledge and understanding of Target Heart Rate Range (THRR);Able to understand and use rate of perceived exertion (RPE) scale      Comments  Pt first day of exercise. Pt tolerated exercise prescription well. Pt understands THRR, RPE scale, warm up and cool down stretches, and resistance exercises. METs and goals reviewed with Pt. Pt understands goals and responsive to MET information.  Reviewed home exercise guidelines with patient and how to count pulse, hand out given. Pt has an elliptical and treadmill at home. Pt plans to start using elliptical as his mode of home exercise since it's easier on his knees than the TM. Pt aslo has weight machine at home. Pt's goal is to know his parameter for activity.      Expected Outcomes  Will continue to monitor and progress Pt as tolerated.  Pt will add 30 minutes of aerobic exercise at home at least 2 days/week in addition to exercise at cardiac rehab to help achieve personal health and fitness goals.         Discharge Exercise Prescription (Final Exercise Prescription Changes): Exercise Prescription Changes - 04/20/19 1111      Response to Exercise   Blood Pressure (Admit)  108/72    Blood Pressure (Exercise)  120/68    Blood Pressure (Exit)  108/64    Heart Rate (Admit)  75 bpm    Heart Rate (Exercise)  93 bpm    Heart Rate (Exit)  84 bpm    Rating of Perceived Exertion (Exercise)  13    Symptoms  None    Duration  Continue with 30 min of aerobic exercise without signs/symptoms of physical distress.    Intensity  THRR unchanged      Progression   Progression  Continue to progress workloads to  maintain intensity without signs/symptoms of physical distress.    Average METs  3.7      Resistance Training   Training Prescription  Yes    Weight  5lbs    Reps  10-15    Time  10 Minutes      Recumbant Bike   Level  3   Upright Scifit bike   Watts  --    Minutes  15    METs  4.7      NuStep   Level  3    SPM  85    Minutes  15    METs  2.6      Home Exercise Plan   Plans to continue exercise at  Home (comment)   Elliptical, treadmill at home.   Frequency  Add 2 additional days to program exercise sessions.    Initial Home Exercises Provided  04/20/19       Nutrition:  Target Goals: Understanding of nutrition guidelines, daily intake of sodium <1516m, cholesterol <209m calories 30% from fat and 7% or less from saturated fats, daily to have 5 or more servings of fruits and vegetables.  Biometrics: Pre Biometrics - 04/07/19 0721      Pre Biometrics   Height  '5\' 11"'  (1.803 m)    Weight  97.9 kg    Waist Circumference  40.75 inches    Hip Circumference  41.75 inches    Waist to Hip Ratio  0.98 %    BMI (Calculated)  30.12    Triceps Skinfold  20 mm    % Body Fat  29.5 %    Grip Strength  39.5 kg    Flexibility  9 in    Single Leg Stand  30 seconds        Nutrition Therapy Plan and Nutrition Goals:   Nutrition Assessments:   Nutrition Goals Re-Evaluation:   Nutrition Goals Re-Evaluation:   Nutrition Goals Discharge (Final Nutrition Goals Re-Evaluation):   Psychosocial: Target Goals: Acknowledge presence or absence of significant depression and/or stress, maximize coping skills, provide positive support system. Participant is able to verbalize types and ability to use techniques and skills needed for reducing stress and depression.  Initial Review & Psychosocial Screening: Initial Psych Review & Screening - 04/07/19 1341      Initial Review   Current issues with  Current Depression      Family Dynamics   Good Support System?  Yes   DoMarden Nobleas  his wife for support   Comments  DoMarden Nobleays he has been depressed due to hius recent health issues and family. Doug's wife helps to take care of her elderly parents.      Barriers   Psychosocial barriers to participate in program  The patient should benefit from training in stress management and relaxation.      Screening Interventions   Interventions  Encouraged to exercise;To provide support and resources with identified psychosocial needs    Expected Outcomes  Short Term goal: Utilizing psychosocial counselor, staff and physician to assist with identification of specific Stressors or current issues interfering with healing process. Setting desired goal for each stressor or current issue identified.;Long Term Goal: Stressors or current issues are controlled or eliminated.;Short Term goal: Identification and review with participant of any Quality of Life or Depression concerns found by scoring the questionnaire.;Long Term goal: The participant improves quality of Life and PHQ9 Scores as seen by post scores and/or verbalization of changes       Quality  of Life Scores: Quality of Life - 04/07/19 1227      Quality of Life   Select  Quality of Life      Quality of Life Scores   Health/Function Pre  13.2 %    Socioeconomic Pre  24 %    Psych/Spiritual Pre  20 %    Family Pre  18 %    GLOBAL Pre  17.65 %      Scores of 19 and below usually indicate a poorer quality of life in these areas.  A difference of  2-3 points is a clinically meaningful difference.  A difference of 2-3 points in the total score of the Quality of Life Index has been associated with significant improvement in overall quality of life, self-image, physical symptoms, and general health in studies assessing change in quality of life.  PHQ-9: Recent Review Flowsheet Data    Depression screen [facility] 2/9 04/07/2019 11/25/2015   Decreased Interest 0 0   Down, Depressed, Hopeless 1  1   PHQ - 2 Score 1 1     Interpretation of  Total Score  Total Score Depression Severity:  1-4 = Minimal depression, 5-9 = Mild depression, 10-14 = Moderate depression, 15-19 = Moderately severe depression, 20-27 = Severe depression   Psychosocial Evaluation and Intervention: Psychosocial Evaluation - 04/13/19 1440      Psychosocial Evaluation & Interventions   Interventions  Physician referral;Relaxation education;Stress management education;Encouraged to exercise with the program and follow exercise prescription    Comments  Doug reports depression related to his current medical issues that affect his QOL (tinnuitis).  Marden Noble enjoys doing Haematologist.    Expected Outcomes  Marden Noble will report ability to manage his depression.    Continue Psychosocial Services   Follow up required by staff       Psychosocial Re-Evaluation: Psychosocial Re-Evaluation    [place] Name 04/13/19 1445             Psychosocial Re-Evaluation   Current issues with  Current Depression       Comments  Marden Noble reports depression r/t to his current medical conditions (vertigo and tinnuitis).  F/U appointment made with PCP.       Expected Outcomes  Marden Noble will report ability to maintain his depression.       Interventions  Encouraged to attend Cardiac Rehabilitation for the exercise;Physician referral;Stress management education;Relaxation education       Continue Psychosocial Services   Follow up required by staff          Psychosocial Discharge (Final Psychosocial Re-Evaluation): Psychosocial Re-Evaluation - 04/13/19 1445      Psychosocial Re-Evaluation   Current issues with  Current Depression    Comments  Doug reports depression r/t to his current medical conditions (vertigo and tinnuitis).  F/U appointment made with PCP.    Expected Outcomes  Marden Noble will report ability to maintain his depression.    Interventions  Encouraged to attend Cardiac Rehabilitation for the exercise;Physician referral;Stress management education;Relaxation education    Continue  Psychosocial Services   Follow up required by staff       Vocational Rehabilitation: Provide vocational rehab assistance to qualifying candidates.   Vocational Rehab Evaluation & Intervention: Vocational Rehab - 04/07/19 2878      Initial Vocational Rehab Evaluation & Intervention   Assessment shows need for Vocational Rehabilitation  No   Marden Noble is currently working and does not need vocational rehab at this time.      Education: Education Goals: Education classes  will be provided on a weekly basis, covering required topics. Participant will state understanding/return demonstration of topics presented.  Learning Barriers/Preferences: Learning Barriers/Preferences - 04/07/19 1228      Learning Barriers/Preferences   Learning Barriers  None    Learning Preferences  Skilled Demonstration;Verbal Instruction;Written Material       Education Topics: Count Your Pulse:  -Group instruction provided by verbal instruction, demonstration, patient participation and written materials to support subject.  Instructors address importance of being able to find your pulse and how to count your pulse when at home without a heart monitor.  Patients get hands on experience counting their pulse with staff help and individually.   Heart Attack, Angina, and Risk Factor Modification:  -Group instruction provided by verbal instruction, video, and written materials to support subject.  Instructors address signs and symptoms of angina and heart attacks.    Also discuss risk factors for heart disease and how to make changes to improve heart health risk factors.   Functional Fitness:  -Group instruction provided by verbal instruction, demonstration, patient participation, and written materials to support subject.  Instructors address safety measures for doing things around the house.  Discuss how to get up and down off the floor, how to pick things up properly, how to safely get out of a chair without  assistance, and balance training.   Meditation and Mindfulness:  -Group instruction provided by verbal instruction, patient participation, and written materials to support subject.  Instructor addresses importance of mindfulness and meditation practice to help reduce stress and improve awareness.  Instructor also leads participants through a meditation exercise.    Stretching for Flexibility and Mobility:  -Group instruction provided by verbal instruction, patient participation, and written materials to support subject.  Instructors lead participants through series of stretches that are designed to increase flexibility thus improving mobility.  These stretches are additional exercise for major muscle groups that are typically performed during regular warm up and cool down.   Hands Only CPR:  -Group verbal, video, and participation provides a basic overview of AHA guidelines for community CPR. Role-play of emergencies allow participants the opportunity to practice calling for help and chest compression technique with discussion of AED use.   Hypertension: -Group verbal and written instruction that provides a basic overview of hypertension including the most recent diagnostic guidelines, risk factor reduction with self-care instructions and medication management.    Nutrition I class: Heart Healthy Eating:  -Group instruction provided by PowerPoint slides, verbal discussion, and written materials to support subject matter. The instructor gives an explanation and review of the Therapeutic Lifestyle Changes diet recommendations, which includes a discussion on lipid goals, dietary fat, sodium, fiber, plant stanol/sterol esters, sugar, and the components of a well-balanced, healthy diet.   Nutrition II class: Lifestyle Skills:  -Group instruction provided by PowerPoint slides, verbal discussion, and written materials to support subject matter. The instructor gives an explanation and review of  label reading, grocery shopping for heart health, heart healthy recipe modifications, and ways to make healthier choices when eating out.   Diabetes Question & Answer:  -Group instruction provided by PowerPoint slides, verbal discussion, and written materials to support subject matter. The instructor gives an explanation and review of diabetes co-morbidities, pre- and post-prandial blood glucose goals, pre-exercise blood glucose goals, signs, symptoms, and treatment of hypoglycemia and hyperglycemia, and foot care basics.   Diabetes Blitz:  -Group instruction provided by PowerPoint slides, verbal discussion, and written materials to support subject matter. The instructor gives  an explanation and review of the physiology behind type 1 and type 2 diabetes, diabetes medications and rational behind using different medications, pre- and post-prandial blood glucose recommendations and Hemoglobin A1c goals, diabetes diet, and exercise including blood glucose guidelines for exercising safely.    Portion Distortion:  -Group instruction provided by PowerPoint slides, verbal discussion, written materials, and food models to support subject matter. The instructor gives an explanation of serving size versus portion size, changes in portions sizes over the last 20 years, and what consists of a serving from each food group.   Stress Management:  -Group instruction provided by verbal instruction, video, and written materials to support subject matter.  Instructors review role of stress in heart disease and how to cope with stress positively.     Exercising on Your Own:  -Group instruction provided by verbal instruction, power point, and written materials to support subject.  Instructors discuss benefits of exercise, components of exercise, frequency and intensity of exercise, and end points for exercise.  Also discuss use of nitroglycerin and activating EMS.  Review options of places to exercise outside of  rehab.  Review guidelines for sex with heart disease.   Cardiac Drugs I:  -Group instruction provided by verbal instruction and written materials to support subject.  Instructor reviews cardiac drug classes: antiplatelets, anticoagulants, beta blockers, and statins.  Instructor discusses reasons, side effects, and lifestyle considerations for each drug class.   Cardiac Drugs II:  -Group instruction provided by verbal instruction and written materials to support subject.  Instructor reviews cardiac drug classes: angiotensin converting enzyme inhibitors (ACE-I), angiotensin II receptor blockers (ARBs), nitrates, and calcium channel blockers.  Instructor discusses reasons, side effects, and lifestyle considerations for each drug class.   Anatomy and Physiology of the Circulatory System:  Group verbal and written instruction and models provide basic cardiac anatomy and physiology, with the coronary electrical and arterial systems. Review of: AMI, Angina, Valve disease, Heart Failure, Peripheral Artery Disease, Cardiac Arrhythmia, Pacemakers, and the ICD.   Other Education:  -Group or individual verbal, written, or video instructions that support the educational goals of the cardiac rehab program.   Holiday Eating Survival Tips:  -Group instruction provided by PowerPoint slides, verbal discussion, and written materials to support subject matter. The instructor gives patients tips, tricks, and techniques to help them not only survive but enjoy the holidays despite the onslaught of food that accompanies the holidays.   Knowledge Questionnaire Score: Knowledge Questionnaire Score - 04/07/19 1230      Knowledge Questionnaire Score   Pre Score  20/24       Core Components/Risk Factors/Patient Goals at Admission: Personal Goals and Risk Factors at Admission - 04/07/19 1348      Core Components/Risk Factors/Patient Goals on Admission    Weight Management  Yes;Obesity    Expected Outcomes   Short Term: Continue to assess and modify interventions until short term weight is achieved;Long Term: Adherence to nutrition and physical activity/exercise program aimed toward attainment of established weight goal    Hypertension  Yes    Intervention  Provide education on lifestyle modifcations including regular physical activity/exercise, weight management, moderate sodium restriction and increased consumption of fresh fruit, vegetables, and low fat dairy, alcohol moderation, and smoking cessation.;Monitor prescription use compliance.    Lipids  Yes    Intervention  Provide education and support for participant on nutrition & aerobic/resistive exercise along with prescribed medications to achieve LDL <50m, HDL >43m    Expected Outcomes  Short Term:  Participant states understanding of desired cholesterol values and is compliant with medications prescribed. Participant is following exercise prescription and nutrition guidelines.;Long Term: Cholesterol controlled with medications as prescribed, with individualized exercise RX and with personalized nutrition plan. Value goals: LDL < 50m, HDL > 40 mg.    Stress  Yes    Intervention  Offer individual and/or small group education and counseling on adjustment to heart disease, stress management and health-related lifestyle change. Teach and support self-help strategies.;Refer participants experiencing significant psychosocial distress to appropriate mental health specialists for further evaluation and treatment. When possible, include family members and significant others in education/counseling sessions.    Expected Outcomes  Short Term: Participant demonstrates changes in health-related behavior, relaxation and other stress management skills, ability to obtain effective social support, and compliance with psychotropic medications if prescribed.;Long Term: Emotional wellbeing is indicated by absence of clinically significant psychosocial distress or social  isolation.    Personal Goal Other  Yes    Personal Goal  Patient would like a better understanding of his health in general and in regards to cardiac health.    Intervention  Provide education on heart disease and risk factor modification to help patient better know his risk factors and how to modify those risk factors.    Expected Outcomes  Patient will understand his risk factors for heart disease. Patient will make necessary lifestyle changes to help reduce risk for heart disease.       Core Components/Risk Factors/Patient Goals Review:  Goals and Risk Factor Review    Row Name 04/13/19 1442             Core Components/Risk Factors/Patient Goals Review   Personal Goals Review  Stress;Hypertension;Lipids;Weight Management/Obesity       Review  Pt with multiple CAD RFs willing to participate in CR exericse.  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       Expected Outcomes  Pt will continue to participate in CR exercise, nutrition, and lifestyle modification opportunities.          Core Components/Risk Factors/Patient Goals at Discharge (Final Review):  Goals and Risk Factor Review - 04/13/19 1442      Core Components/Risk Factors/Patient Goals Review   Personal Goals Review  Stress;Hypertension;Lipids;Weight Management/Obesity    Review  Pt with multiple CAD RFs willing to participate in CR exericse.  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    Expected Outcomes  Pt will continue to participate in CR exercise, nutrition, and lifestyle modification opportunities.       ITP Comments: ITP Comments    Row Name 04/07/19 0715 04/13/19 1440         ITP Comments  Medical Director- Dr. TFransico Him MD  30 Day ITP Review.  Pt started exercise today and tolerated it well.         Comments: See ITP Comments.

## 2019-04-24 ENCOUNTER — Other Ambulatory Visit: Payer: Self-pay

## 2019-04-24 ENCOUNTER — Encounter (HOSPITAL_COMMUNITY)
Admission: RE | Admit: 2019-04-24 | Discharge: 2019-04-24 | Disposition: A | Discharge status: Home / Self Care | Payer: 59 | Source: Ambulatory Visit | Attending: Cardiology | Admitting: Cardiology

## 2019-04-24 DIAGNOSIS — I214 Non-ST elevation (NSTEMI) myocardial infarction: Secondary | ICD-10-CM | POA: Diagnosis not present

## 2019-04-24 DIAGNOSIS — Z955 Presence of coronary angioplasty implant and graft: Secondary | ICD-10-CM

## 2019-04-27 ENCOUNTER — Other Ambulatory Visit: Payer: Self-pay

## 2019-04-27 ENCOUNTER — Encounter (HOSPITAL_COMMUNITY)
Admission: RE | Admit: 2019-04-27 | Discharge: 2019-04-27 | Disposition: A | Discharge status: Home / Self Care | Payer: 59 | Source: Ambulatory Visit | Attending: Cardiology | Admitting: Cardiology

## 2019-04-27 ENCOUNTER — Other Ambulatory Visit: Payer: Self-pay | Admitting: Cardiology

## 2019-04-27 DIAGNOSIS — Z955 Presence of coronary angioplasty implant and graft: Secondary | ICD-10-CM | POA: Diagnosis not present

## 2019-04-27 DIAGNOSIS — I214 Non-ST elevation (NSTEMI) myocardial infarction: Secondary | ICD-10-CM

## 2019-04-27 DIAGNOSIS — I493 Ventricular premature depolarization: Secondary | ICD-10-CM

## 2019-04-29 ENCOUNTER — Ambulatory Visit: Payer: 59 | Admitting: Rehabilitative and Restorative Service Providers"

## 2019-04-29 ENCOUNTER — Other Ambulatory Visit: Payer: Self-pay

## 2019-04-29 ENCOUNTER — Encounter (HOSPITAL_COMMUNITY)
Admission: RE | Admit: 2019-04-29 | Discharge: 2019-04-29 | Disposition: A | Discharge status: Home / Self Care | Payer: 59 | Source: Ambulatory Visit | Attending: Cardiology | Admitting: Cardiology

## 2019-04-29 DIAGNOSIS — Z955 Presence of coronary angioplasty implant and graft: Secondary | ICD-10-CM | POA: Diagnosis not present

## 2019-04-29 DIAGNOSIS — I214 Non-ST elevation (NSTEMI) myocardial infarction: Secondary | ICD-10-CM | POA: Diagnosis not present

## 2019-05-01 ENCOUNTER — Encounter (HOSPITAL_COMMUNITY)
Admission: RE | Admit: 2019-05-01 | Discharge: 2019-05-01 | Disposition: A | Discharge status: Home / Self Care | Payer: 59 | Source: Ambulatory Visit | Attending: Cardiology | Admitting: Cardiology

## 2019-05-01 ENCOUNTER — Other Ambulatory Visit: Payer: Self-pay

## 2019-05-01 DIAGNOSIS — I214 Non-ST elevation (NSTEMI) myocardial infarction: Secondary | ICD-10-CM

## 2019-05-01 DIAGNOSIS — Z955 Presence of coronary angioplasty implant and graft: Secondary | ICD-10-CM

## 2019-05-04 ENCOUNTER — Other Ambulatory Visit: Payer: Self-pay

## 2019-05-04 ENCOUNTER — Encounter (HOSPITAL_COMMUNITY)
Admission: RE | Admit: 2019-05-04 | Discharge: 2019-05-04 | Disposition: A | Discharge status: Home / Self Care | Payer: 59 | Source: Ambulatory Visit | Attending: Cardiology | Admitting: Cardiology

## 2019-05-04 DIAGNOSIS — Z955 Presence of coronary angioplasty implant and graft: Secondary | ICD-10-CM

## 2019-05-04 DIAGNOSIS — I214 Non-ST elevation (NSTEMI) myocardial infarction: Secondary | ICD-10-CM

## 2019-05-06 ENCOUNTER — Other Ambulatory Visit: Payer: Self-pay

## 2019-05-06 ENCOUNTER — Encounter (HOSPITAL_COMMUNITY)
Admission: RE | Admit: 2019-05-06 | Discharge: 2019-05-06 | Disposition: A | Discharge status: Home / Self Care | Payer: 59 | Source: Ambulatory Visit | Attending: Cardiology | Admitting: Cardiology

## 2019-05-06 DIAGNOSIS — I214 Non-ST elevation (NSTEMI) myocardial infarction: Secondary | ICD-10-CM

## 2019-05-06 DIAGNOSIS — Z955 Presence of coronary angioplasty implant and graft: Secondary | ICD-10-CM | POA: Diagnosis not present

## 2019-05-08 ENCOUNTER — Other Ambulatory Visit: Payer: Self-pay

## 2019-05-08 ENCOUNTER — Encounter (HOSPITAL_COMMUNITY)
Admission: RE | Admit: 2019-05-08 | Discharge: 2019-05-08 | Disposition: A | Discharge status: Home / Self Care | Payer: 59 | Source: Ambulatory Visit | Attending: Cardiology | Admitting: Cardiology

## 2019-05-08 DIAGNOSIS — I214 Non-ST elevation (NSTEMI) myocardial infarction: Secondary | ICD-10-CM

## 2019-05-08 DIAGNOSIS — Z955 Presence of coronary angioplasty implant and graft: Secondary | ICD-10-CM | POA: Diagnosis not present

## 2019-05-13 ENCOUNTER — Encounter (HOSPITAL_COMMUNITY)
Admission: RE | Admit: 2019-05-13 | Discharge: 2019-05-13 | Disposition: A | Discharge status: Home / Self Care | Payer: 59 | Source: Ambulatory Visit | Attending: Cardiology | Admitting: Cardiology

## 2019-05-13 ENCOUNTER — Other Ambulatory Visit: Payer: Self-pay

## 2019-05-13 DIAGNOSIS — I214 Non-ST elevation (NSTEMI) myocardial infarction: Secondary | ICD-10-CM

## 2019-05-13 DIAGNOSIS — Z955 Presence of coronary angioplasty implant and graft: Secondary | ICD-10-CM | POA: Diagnosis not present

## 2019-05-15 ENCOUNTER — Encounter (HOSPITAL_COMMUNITY)
Admission: RE | Admit: 2019-05-15 | Discharge: 2019-05-15 | Disposition: A | Discharge status: Home / Self Care | Payer: 59 | Source: Ambulatory Visit | Attending: Cardiology | Admitting: Cardiology

## 2019-05-15 ENCOUNTER — Other Ambulatory Visit: Payer: Self-pay

## 2019-05-15 DIAGNOSIS — I214 Non-ST elevation (NSTEMI) myocardial infarction: Secondary | ICD-10-CM | POA: Diagnosis not present

## 2019-05-15 DIAGNOSIS — Z955 Presence of coronary angioplasty implant and graft: Secondary | ICD-10-CM

## 2019-05-18 ENCOUNTER — Other Ambulatory Visit: Payer: Self-pay

## 2019-05-18 ENCOUNTER — Encounter (HOSPITAL_COMMUNITY)
Admission: RE | Admit: 2019-05-18 | Discharge: 2019-05-18 | Disposition: A | Discharge status: Home / Self Care | Payer: 59 | Source: Ambulatory Visit | Attending: Cardiology | Admitting: Cardiology

## 2019-05-18 DIAGNOSIS — Z955 Presence of coronary angioplasty implant and graft: Secondary | ICD-10-CM

## 2019-05-18 DIAGNOSIS — I214 Non-ST elevation (NSTEMI) myocardial infarction: Secondary | ICD-10-CM | POA: Diagnosis not present

## 2019-05-20 ENCOUNTER — Encounter (HOSPITAL_COMMUNITY)
Admission: RE | Admit: 2019-05-20 | Discharge: 2019-05-20 | Disposition: A | Discharge status: Home / Self Care | Payer: 59 | Source: Ambulatory Visit | Attending: Cardiology | Admitting: Cardiology

## 2019-05-20 ENCOUNTER — Other Ambulatory Visit: Payer: Self-pay

## 2019-05-20 DIAGNOSIS — I214 Non-ST elevation (NSTEMI) myocardial infarction: Secondary | ICD-10-CM | POA: Diagnosis not present

## 2019-05-20 DIAGNOSIS — Z955 Presence of coronary angioplasty implant and graft: Secondary | ICD-10-CM | POA: Diagnosis not present

## 2019-05-21 DIAGNOSIS — R69 Illness, unspecified: Secondary | ICD-10-CM | POA: Diagnosis not present

## 2019-05-21 NOTE — Progress Notes (Signed)
Cardiac Individual Treatment Plan  Patient Details  Name: Shane Kerr MRN: [id number] Date of Birth: 07-22-1956 Referring Provider:     CARDIAC REHAB PHASE II ORIENTATION from 04/07/2019 in [place]  Referring Provider  Vernell Leep, MD      Initial Encounter Date:    CARDIAC REHAB PHASE II ORIENTATION from 04/07/2019 in [place]  Date  04/07/19      Visit Diagnosis: 01/04/19 NSTEMI  01/05/19 PTCA/Dx  Patient's Home Medications on Admission:  Current Outpatient Medications:    aspirin EC 81 MG EC tablet, Take 1 tablet (81 mg total) by mouth daily., Disp: 90 tablet, Rfl: 2   atorvastatin (LIPITOR) 80 MG tablet, Take 1 tablet (80 mg total) by mouth daily at 6 PM., Disp: 90 tablet, Rfl: 2   carvedilol (COREG) 3.125 MG tablet, TAKE 1 TABLET (3.125 MG TOTAL) BY MOUTH 2 (TWO) TIMES DAILY., Disp: 60 tablet, Rfl: 0   meclizine (ANTIVERT) 12.5 MG tablet, Take 1 tablet (12.5 mg total) by mouth 2 (two) times daily as needed for dizziness. (Patient not taking: Reported on 04/01/2019), Disp: 30 tablet, Rfl: 1   methocarbamol (ROBAXIN) 500 MG tablet, Take 500 mg by mouth every 6 (six) hours as needed for muscle spasms (Patient has only needed to take one in the last couple of months.). , Disp: , Rfl:    nitroGLYCERIN (NITROSTAT) 0.4 MG SL tablet, Place 1 tablet (0.4 mg total) under the tongue every 5 (five) minutes as needed for chest pain (Up to 3 doses in 15 minutes.)., Disp: 30 tablet, Rfl: 2   ticagrelor (BRILINTA) 90 MG TABS tablet, Take 1 tablet (90 mg total) by mouth 2 (two) times daily., Disp: 180 tablet, Rfl: 3  Past Medical History: Past Medical History:  Diagnosis Date   Coronary artery disease    Depression    HTN (hypertension) 01/14/2019   Hyperlipemia 01/14/2019   NSTEMI (non-ST elevated myocardial infarction) (HCC)     Tobacco Use: Social History   Tobacco Use  Smoking Status Never Smoker    Smokeless Tobacco Never Used    Labs: Recent Review Flowsheet Data    Labs for ITP Cardiac and Pulmonary Rehab Latest Ref Rng & Units 12/09/2012 01/04/2019 01/05/2019   Cholestrol 0 - 200 mg/dL 153 138 -   LDLCALC 0 - 99 mg/dL 87 75 -   HDL >40 mg/dL 51 57 -   Trlycerides <150 mg/dL 73 31 -   Hemoglobin A1c 4.8 - 5.6 % - - 5.8(H)      Capillary Blood Glucose: No results found for: GLUCAP   Exercise Target Goals: Exercise Program Goal: Individual exercise prescription set using results from initial 6 min walk test and THRR while considering  patients activity barriers and safety.   Exercise Prescription Goal: Initial exercise prescription builds to 30-45 minutes a day of aerobic activity, 2-3 days per week.  Home exercise guidelines will be given to patient during program as part of exercise prescription that the participant will acknowledge.  Activity Barriers & Risk Stratification: Activity Barriers & Cardiac Risk Stratification - 04/07/19 1212      Activity Barriers & Cardiac Risk Stratification   Activity Barriers  Other (comment)    Comments  Prior back surgery, left knee arthroscopy, vertigo    Cardiac Risk Stratification  High       6 Minute Walk: 6 Minute Walk    Row Name 04/07/19 0800  6 Minute Walk   Phase  Initial     Distance  1575 feet     Walk Time  6 minutes     # of Rest Breaks  0     MPH  2.98     METS  3.44     RPE  11     Perceived Dyspnea   0     VO2 Peak  12.04     Symptoms  No     Resting HR  69 bpm     Resting BP  102/60     Resting Oxygen Saturation   97 %     Exercise Oxygen Saturation  during 6 min walk  98 %     Max Ex. HR  91 bpm     Max Ex. BP  120/78     2 Minute Post BP  102/78        Oxygen Initial Assessment:   Oxygen Re-Evaluation:   Oxygen Discharge (Final Oxygen Re-Evaluation):   Initial Exercise Prescription: Initial Exercise Prescription - 04/07/19 1200      Date of Initial Exercise RX and Referring  Provider   Date  04/07/19    Referring Provider  Vernell Leep, MD    Expected Discharge Date  05/22/19      Recumbant Bike   Level  3    Watts  45    Minutes  15    METs  3.46      NuStep   Level  3    SPM  85    Minutes  15    METs  3      Prescription Details   Frequency (times per week)  3    Duration  Progress to 30 minutes of continuous aerobic without signs/symptoms of physical distress      Intensity   THRR 40-80% of Max Heartrate  62-125    Ratings of Perceived Exertion  11-13    Perceived Dyspnea  0-4      Progression   Progression  Continue to progress workloads to maintain intensity without signs/symptoms of physical distress.      Resistance Training   Training Prescription  Yes    Weight  5lbs    Reps  10-15       Perform Capillary Blood Glucose checks as needed.  Exercise Prescription Changes: Exercise Prescription Changes    Row Name 04/13/19 1500 04/20/19 1111 05/04/19 1057 05/18/19 1101 05/20/19 1101     Response to Exercise   Blood Pressure (Admit)  128/76  108/72  116/66  120/70  102/60   Blood Pressure (Exercise)  138/82  120/68  150/72  172/76  158/62   Blood Pressure (Exit)  130/62  108/64  108/64  108/74  108/72   Heart Rate (Admit)  70 bpm  75 bpm  82 bpm  83 bpm  84 bpm   Heart Rate (Exercise)  97 bpm  93 bpm  118 bpm  119 bpm  121 bpm   Heart Rate (Exit)  71 bpm  84 bpm  90 bpm  93 bpm  92 bpm   Rating of Perceived Exertion (Exercise)  '11  13  13  13  15   ' Symptoms  None  None  None  None  None   Comments  Pt first day of exercise.   --  --  --  Patient moved from bike to treadmill today.   Duration  Continue with 30 min of aerobic  exercise without signs/symptoms of physical distress.  Continue with 30 min of aerobic exercise without signs/symptoms of physical distress.  Continue with 30 min of aerobic exercise without signs/symptoms of physical distress.  Continue with 30 min of aerobic exercise without signs/symptoms of physical  distress.  Continue with 30 min of aerobic exercise without signs/symptoms of physical distress.   Intensity  THRR unchanged  THRR unchanged  THRR unchanged  THRR unchanged  THRR unchanged     Progression   Progression  Continue to progress workloads to maintain intensity without signs/symptoms of physical distress.  Continue to progress workloads to maintain intensity without signs/symptoms of physical distress.  Continue to progress workloads to maintain intensity without signs/symptoms of physical distress.  Continue to progress workloads to maintain intensity without signs/symptoms of physical distress.  Continue to progress workloads to maintain intensity without signs/symptoms of physical distress.   Average METs  3.1  3.7  5.4  --  4.2     Resistance Training   Training Prescription  Yes  Yes  Yes  Yes  No Relaxation day, no weights.   Weight  5lbs  5lbs  5lbs  6lbs  --   Reps  10-15  10-15  10-15  10-15  --   Time  10 Minutes  10 Minutes  10 Minutes  10 Minutes  --     Treadmill   MPH  --  --  --  --  2.2   Grade  --  --  --  --  2   Minutes  --  --  --  --  15   METs  --  --  --  --  3.29     Recumbant Bike   Level  3  3 Upright Scifit bike  5 Upright Scifit bike  6 Upright Scifit bike  --   Watts  45  --  --  --  --   Minutes  '15  15  15  15  ' --   METs  3.6  4.7  6.2  6.5  --     NuStep   Level  '3  3  6  7  8   ' SPM  85  85  85  85  85   Minutes  '15  15  15  15  15   ' METs  2.5  2.6  4.5  -- Didn't report MET avg  5.1     Home Exercise Plan   Plans to continue exercise at  --  Home (comment) Elliptical, treadmill at home.  Home (comment) Elliptical, treadmill at home.  Home (comment) Elliptical, treadmill at home.  Home (comment) Elliptical, treadmill at home.   Frequency  --  Add 2 additional days to program exercise sessions.  Add 2 additional days to program exercise sessions.  Add 2 additional days to program exercise sessions.  Add 2 additional days to program exercise  sessions.   Initial Home Exercises Provided  --  04/20/19  04/20/19  04/20/19  04/20/19      Exercise Comments: Exercise Comments    Row Name 04/13/19 1526 04/20/19 1119 05/18/19 1112       Exercise Comments  Pt first day of exercise. Pt tolerated exercise well.  Reviewed home exercise guidelines, METs, and goals with patient.  Reviewed METs and goals with patient.        Exercise Goals and Review: Exercise Goals    Row Name 04/07/19 [phone]  Exercise Goals   Increase Physical Activity  Yes       Intervention  Provide advice, education, support and counseling about physical activity/exercise needs.;Develop an individualized exercise prescription for aerobic and resistive training based on initial evaluation findings, risk stratification, comorbidities and participant's personal goals.       Expected Outcomes  Short Term: Attend rehab on a regular basis to increase amount of physical activity.;Long Term: Exercising regularly at least 3-5 days a week.;Long Term: Add in home exercise to make exercise part of routine and to increase amount of physical activity.       Increase Strength and Stamina  Yes       Intervention  Provide advice, education, support and counseling about physical activity/exercise needs.;Develop an individualized exercise prescription for aerobic and resistive training based on initial evaluation findings, risk stratification, comorbidities and participant's personal goals.       Expected Outcomes  Short Term: Increase workloads from initial exercise prescription for resistance, speed, and METs.;Short Term: Perform resistance training exercises routinely during rehab and add in resistance training at home;Long Term: Improve cardiorespiratory fitness, muscular endurance and strength as measured by increased METs and functional capacity (6MWT)       Able to understand and use rate of perceived exertion (RPE) scale  Yes       Intervention  Provide education and  explanation on how to use RPE scale       Expected Outcomes  Short Term: Able to use RPE daily in rehab to express subjective intensity level;Long Term:  Able to use RPE to guide intensity level when exercising independently       Knowledge and understanding of Target Heart Rate Range (THRR)  Yes       Intervention  Provide education and explanation of THRR including how the numbers were predicted and where they are located for reference       Expected Outcomes  Short Term: Able to state/look up THRR;Long Term: Able to use THRR to govern intensity when exercising independently;Short Term: Able to use daily as guideline for intensity in rehab       Able to check pulse independently  Yes       Intervention  Provide education and demonstration on how to check pulse in carotid and radial arteries.;Review the importance of being able to check your own pulse for safety during independent exercise       Expected Outcomes  Short Term: Able to explain why pulse checking is important during independent exercise;Long Term: Able to check pulse independently and accurately       Understanding of Exercise Prescription  Yes       Intervention  Provide education, explanation, and written materials on patient's individual exercise prescription       Expected Outcomes  Short Term: Able to explain program exercise prescription;Long Term: Able to explain home exercise prescription to exercise independently          Exercise Goals Re-Evaluation : Exercise Goals Re-Evaluation    Row Name 04/13/19 1522 04/20/19 1119 05/18/19 1112         Exercise Goal Re-Evaluation   Exercise Goals Review  Increase Physical Activity;Understanding of Exercise Prescription;Increase Strength and Stamina;Knowledge and understanding of Target Heart Rate Range (THRR);Able to understand and use rate of perceived exertion (RPE) scale  Increase Physical Activity;Understanding of Exercise Prescription;Increase Strength and Stamina;Knowledge and  understanding of Target Heart Rate Range (THRR);Able to understand and use rate of perceived exertion (RPE) scale  Increase Physical  Activity;Understanding of Exercise Prescription;Increase Strength and Stamina;Knowledge and understanding of Target Heart Rate Range (THRR);Able to understand and use rate of perceived exertion (RPE) scale     Comments  Pt first day of exercise. Pt tolerated exercise prescription well. Pt understands THRR, RPE scale, warm up and cool down stretches, and resistance exercises. METs and goals reviewed with Pt. Pt understands goals and responsive to MET information.  Reviewed home exercise guidelines with patient and how to count pulse, hand out given. Pt has an elliptical and treadmill at home. Pt plans to start using elliptical as his mode of home exercise since it's easier on his knees than the TM. Pt aslo has weight machine at home. Pt's goal is to know his parameter for activity.  Patient feels like he's doing well with with his exercise. Patient is using his elliptical machine at home 15 minutes, 2 days/week. Pt plans to increase to 15 minutes BID, 2 days/week.     Expected Outcomes  Will continue to monitor and progress Pt as tolerated.  Pt will add 30 minutes of aerobic exercise at home at least 2 days/week in addition to exercise at cardiac rehab to help achieve personal health and fitness goals.  Patient will increase home exercise from 15 minutes, 2 days/week to 15 minutes twice/day, 2 days/week.        Discharge Exercise Prescription (Final Exercise Prescription Changes): Exercise Prescription Changes - 05/20/19 1101      Response to Exercise   Blood Pressure (Admit)  102/60    Blood Pressure (Exercise)  158/62    Blood Pressure (Exit)  108/72    Heart Rate (Admit)  84 bpm    Heart Rate (Exercise)  121 bpm    Heart Rate (Exit)  92 bpm    Rating of Perceived Exertion (Exercise)  15    Symptoms  None    Comments  Patient moved from bike to treadmill today.     Duration  Continue with 30 min of aerobic exercise without signs/symptoms of physical distress.    Intensity  THRR unchanged      Progression   Progression  Continue to progress workloads to maintain intensity without signs/symptoms of physical distress.    Average METs  4.2      Resistance Training   Training Prescription  No   Relaxation day, no weights.     Treadmill   MPH  2.2    Grade  2    Minutes  15    METs  3.29      Recumbant Bike   Level  --    Minutes  --    METs  --      NuStep   Level  8    SPM  85    Minutes  15    METs  5.1      Home Exercise Plan   Plans to continue exercise at  Home (comment)   Elliptical, treadmill at home.   Frequency  Add 2 additional days to program exercise sessions.    Initial Home Exercises Provided  04/20/19       Nutrition:  Target Goals: Understanding of nutrition guidelines, daily intake of sodium <1566m, cholesterol <2060m calories 30% from fat and 7% or less from saturated fats, daily to have 5 or more servings of fruits and vegetables.  Biometrics: Pre Biometrics - 04/07/19 0721      Pre Biometrics   Height  '5\' 11"'  (1.803 m)    Weight  97.9 kg    Waist Circumference  40.75 inches    Hip Circumference  41.75 inches    Waist to Hip Ratio  0.98 %    BMI (Calculated)  30.12    Triceps Skinfold  20 mm    % Body Fat  29.5 %    Grip Strength  39.5 kg    Flexibility  9 in    Single Leg Stand  30 seconds        Nutrition Therapy Plan and Nutrition Goals:   Nutrition Assessments:   Nutrition Goals Re-Evaluation:   Nutrition Goals Re-Evaluation:   Nutrition Goals Discharge (Final Nutrition Goals Re-Evaluation):   Psychosocial: Target Goals: Acknowledge presence or absence of significant depression and/or stress, maximize coping skills, provide positive support system. Participant is able to verbalize types and ability to use techniques and skills needed for reducing stress and depression.  Initial  Review & Psychosocial Screening: Initial Psych Review & Screening - 04/07/19 1341      Initial Review   Current issues with  Current Depression      Family Dynamics   Good Support System?  Yes   Marden Noble has his wife for support   Comments  Marden Noble says he has been depressed due to hius recent health issues and family. Doug's wife helps to take care of her elderly parents.      Barriers   Psychosocial barriers to participate in program  The patient should benefit from training in stress management and relaxation.      Screening Interventions   Interventions  Encouraged to exercise;To provide support and resources with identified psychosocial needs    Expected Outcomes  Short Term goal: Utilizing psychosocial counselor, staff and physician to assist with identification of specific Stressors or current issues interfering with healing process. Setting desired goal for each stressor or current issue identified.;Long Term Goal: Stressors or current issues are controlled or eliminated.;Short Term goal: Identification and review with participant of any Quality of Life or Depression concerns found by scoring the questionnaire.;Long Term goal: The participant improves quality of Life and PHQ9 Scores as seen by post scores and/or verbalization of changes       Quality of Life Scores: Quality of Life - 04/07/19 1227      Quality of Life   Select  Quality of Life      Quality of Life Scores   Health/Function Pre  13.2 %    Socioeconomic Pre  24 %    Psych/Spiritual Pre  20 %    Family Pre  18 %    GLOBAL Pre  17.65 %      Scores of 19 and below usually indicate a poorer quality of life in these areas.  A difference of  2-3 points is a clinically meaningful difference.  A difference of 2-3 points in the total score of the Quality of Life Index has been associated with significant improvement in overall quality of life, self-image, physical symptoms, and general health in studies assessing change in  quality of life.  PHQ-9: Recent Review Flowsheet Data    Depression screen [facility] 2/9 04/07/2019 11/25/2015   Decreased Interest 0 0   Down, Depressed, Hopeless 1  1   PHQ - 2 Score 1 1     Interpretation of Total Score  Total Score Depression Severity:  1-4 = Minimal depression, 5-9 = Mild depression, 10-14 = Moderate depression, 15-19 = Moderately severe depression, 20-27 = Severe depression   Psychosocial Evaluation and Intervention:  Psychosocial Evaluation - 04/13/19 1440      Psychosocial Evaluation & Interventions   Interventions  Physician referral;Relaxation education;Stress management education;Encouraged to exercise with the program and follow exercise prescription    Comments  Doug reports depression related to his current medical issues that affect his QOL (tinnuitis).  Marden Noble enjoys doing Haematologist.    Expected Outcomes  Marden Noble will report ability to manage his depression.    Continue Psychosocial Services   Follow up required by staff       Psychosocial Re-Evaluation: Psychosocial Re-Evaluation    [place] Name 04/13/19 1445 05/21/19 1230           Psychosocial Re-Evaluation   Current issues with  Current Depression  Current Depression      Comments  Doug reports depression r/t to his current medical conditions (vertigo and tinnuitis).  F/U appointment made with PCP.  Marden Noble continues to question his body despite his progress at [facility].  Marden Noble would like to speak with Jeanella Craze for some counseling.  Will work with pt to get an appointment scheduled.      Expected Outcomes  Marden Noble will report ability to maintain his depression.  Marden Noble will report ability to maintain his depression.      Interventions  Encouraged to attend Cardiac Rehabilitation for the exercise;Physician referral;Stress management education;Relaxation education  Encouraged to attend Cardiac Rehabilitation for the exercise;Physician referral;Stress management education;Relaxation education      Continue Psychosocial  Services   Follow up required by staff  Follow up required by counselor         Psychosocial Discharge (Final Psychosocial Re-Evaluation): Psychosocial Re-Evaluation - 05/21/19 1230      Psychosocial Re-Evaluation   Current issues with  Current Depression    Comments  Marden Noble continues to question his body despite his progress at [facility].  Marden Noble would like to speak with Jeanella Craze for some counseling.  Will work with pt to get an appointment scheduled.    Expected Outcomes  Marden Noble will report ability to maintain his depression.    Interventions  Encouraged to attend Cardiac Rehabilitation for the exercise;Physician referral;Stress management education;Relaxation education    Continue Psychosocial Services   Follow up required by counselor       Vocational Rehabilitation: Provide vocational rehab assistance to qualifying candidates.   Vocational Rehab Evaluation & Intervention: Vocational Rehab - 04/07/19 5364      Initial Vocational Rehab Evaluation & Intervention   Assessment shows need for Vocational Rehabilitation  No   Marden Noble is currently working and does not need vocational rehab at this time.      Education: Education Goals: Education classes will be provided on a weekly basis, covering required topics. Participant will state understanding/return demonstration of topics presented.  Learning Barriers/Preferences: Learning Barriers/Preferences - 04/07/19 1228      Learning Barriers/Preferences   Learning Barriers  None    Learning Preferences  Skilled Demonstration;Verbal Instruction;Written Material       Education Topics: Count Your Pulse:  -Group instruction provided by verbal instruction, demonstration, patient participation and written materials to support subject.  Instructors address importance of being able to find your pulse and how to count your pulse when at home without a heart monitor.  Patients get hands on experience counting their pulse with staff help and  individually.   Heart Attack, Angina, and Risk Factor Modification:  -Group instruction provided by verbal instruction, video, and written materials to support subject.  Instructors address signs and symptoms of angina and heart attacks.  Also discuss risk factors for heart disease and how to make changes to improve heart health risk factors.   Functional Fitness:  -Group instruction provided by verbal instruction, demonstration, patient participation, and written materials to support subject.  Instructors address safety measures for doing things around the house.  Discuss how to get up and down off the floor, how to pick things up properly, how to safely get out of a chair without assistance, and balance training.   Meditation and Mindfulness:  -Group instruction provided by verbal instruction, patient participation, and written materials to support subject.  Instructor addresses importance of mindfulness and meditation practice to help reduce stress and improve awareness.  Instructor also leads participants through a meditation exercise.    Stretching for Flexibility and Mobility:  -Group instruction provided by verbal instruction, patient participation, and written materials to support subject.  Instructors lead participants through series of stretches that are designed to increase flexibility thus improving mobility.  These stretches are additional exercise for major muscle groups that are typically performed during regular warm up and cool down.   Hands Only CPR:  -Group verbal, video, and participation provides a basic overview of AHA guidelines for community CPR. Role-play of emergencies allow participants the opportunity to practice calling for help and chest compression technique with discussion of AED use.   Hypertension: -Group verbal and written instruction that provides a basic overview of hypertension including the most recent diagnostic guidelines, risk factor reduction with  self-care instructions and medication management.    Nutrition I class: Heart Healthy Eating:  -Group instruction provided by PowerPoint slides, verbal discussion, and written materials to support subject matter. The instructor gives an explanation and review of the Therapeutic Lifestyle Changes diet recommendations, which includes a discussion on lipid goals, dietary fat, sodium, fiber, plant stanol/sterol esters, sugar, and the components of a well-balanced, healthy diet.   Nutrition II class: Lifestyle Skills:  -Group instruction provided by PowerPoint slides, verbal discussion, and written materials to support subject matter. The instructor gives an explanation and review of label reading, grocery shopping for heart health, heart healthy recipe modifications, and ways to make healthier choices when eating out.   Diabetes Question & Answer:  -Group instruction provided by PowerPoint slides, verbal discussion, and written materials to support subject matter. The instructor gives an explanation and review of diabetes co-morbidities, pre- and post-prandial blood glucose goals, pre-exercise blood glucose goals, signs, symptoms, and treatment of hypoglycemia and hyperglycemia, and foot care basics.   Diabetes Blitz:  -Group instruction provided by PowerPoint slides, verbal discussion, and written materials to support subject matter. The instructor gives an explanation and review of the physiology behind type 1 and type 2 diabetes, diabetes medications and rational behind using different medications, pre- and post-prandial blood glucose recommendations and Hemoglobin A1c goals, diabetes diet, and exercise including blood glucose guidelines for exercising safely.    Portion Distortion:  -Group instruction provided by PowerPoint slides, verbal discussion, written materials, and food models to support subject matter. The instructor gives an explanation of serving size versus portion size, changes in  portions sizes over the last 20 years, and what consists of a serving from each food group.   Stress Management:  -Group instruction provided by verbal instruction, video, and written materials to support subject matter.  Instructors review role of stress in heart disease and how to cope with stress positively.     Exercising on Your Own:  -Group instruction provided by verbal instruction, power point, and written materials  to support subject.  Instructors discuss benefits of exercise, components of exercise, frequency and intensity of exercise, and end points for exercise.  Also discuss use of nitroglycerin and activating EMS.  Review options of places to exercise outside of rehab.  Review guidelines for sex with heart disease.   Cardiac Drugs I:  -Group instruction provided by verbal instruction and written materials to support subject.  Instructor reviews cardiac drug classes: antiplatelets, anticoagulants, beta blockers, and statins.  Instructor discusses reasons, side effects, and lifestyle considerations for each drug class.   Cardiac Drugs II:  -Group instruction provided by verbal instruction and written materials to support subject.  Instructor reviews cardiac drug classes: angiotensin converting enzyme inhibitors (ACE-I), angiotensin II receptor blockers (ARBs), nitrates, and calcium channel blockers.  Instructor discusses reasons, side effects, and lifestyle considerations for each drug class.   Anatomy and Physiology of the Circulatory System:  Group verbal and written instruction and models provide basic cardiac anatomy and physiology, with the coronary electrical and arterial systems. Review of: AMI, Angina, Valve disease, Heart Failure, Peripheral Artery Disease, Cardiac Arrhythmia, Pacemakers, and the ICD.   Other Education:  -Group or individual verbal, written, or video instructions that support the educational goals of the cardiac rehab program.   Holiday Eating Survival  Tips:  -Group instruction provided by PowerPoint slides, verbal discussion, and written materials to support subject matter. The instructor gives patients tips, tricks, and techniques to help them not only survive but enjoy the holidays despite the onslaught of food that accompanies the holidays.   Knowledge Questionnaire Score: Knowledge Questionnaire Score - 04/07/19 1230      Knowledge Questionnaire Score   Pre Score  20/24       Core Components/Risk Factors/Patient Goals at Admission: Personal Goals and Risk Factors at Admission - 04/07/19 1348      Core Components/Risk Factors/Patient Goals on Admission    Weight Management  Yes;Obesity    Expected Outcomes  Short Term: Continue to assess and modify interventions until short term weight is achieved;Long Term: Adherence to nutrition and physical activity/exercise program aimed toward attainment of established weight goal    Hypertension  Yes    Intervention  Provide education on lifestyle modifcations including regular physical activity/exercise, weight management, moderate sodium restriction and increased consumption of fresh fruit, vegetables, and low fat dairy, alcohol moderation, and smoking cessation.;Monitor prescription use compliance.    Lipids  Yes    Intervention  Provide education and support for participant on nutrition & aerobic/resistive exercise along with prescribed medications to achieve LDL <70m, HDL >424m    Expected Outcomes  Short Term: Participant states understanding of desired cholesterol values and is compliant with medications prescribed. Participant is following exercise prescription and nutrition guidelines.;Long Term: Cholesterol controlled with medications as prescribed, with individualized exercise RX and with personalized nutrition plan. Value goals: LDL < 7079mHDL > 40 mg.    Stress  Yes    Intervention  Offer individual and/or small group education and counseling on adjustment to heart disease, stress  management and health-related lifestyle change. Teach and support self-help strategies.;Refer participants experiencing significant psychosocial distress to appropriate mental health specialists for further evaluation and treatment. When possible, include family members and significant others in education/counseling sessions.    Expected Outcomes  Short Term: Participant demonstrates changes in health-related behavior, relaxation and other stress management skills, ability to obtain effective social support, and compliance with psychotropic medications if prescribed.;Long Term: Emotional wellbeing is indicated by absence of clinically significant psychosocial  distress or social isolation.    Personal Goal Other  Yes    Personal Goal  Patient would like a better understanding of his health in general and in regards to cardiac health.    Intervention  Provide education on heart disease and risk factor modification to help patient better know his risk factors and how to modify those risk factors.    Expected Outcomes  Patient will understand his risk factors for heart disease. Patient will make necessary lifestyle changes to help reduce risk for heart disease.       Core Components/Risk Factors/Patient Goals Review:  Goals and Risk Factor Review    Row Name 04/13/19 1442 05/21/19 1233           Core Components/Risk Factors/Patient Goals Review   Personal Goals Review  Stress;Hypertension;Lipids;Weight Management/Obesity  Stress;Hypertension;Lipids;Weight Management/Obesity      Review  Pt with multiple CAD RFs willing to participate in CR exericse.  Marden Noble would like to understand his health better and reasonably decrease the amount of medications he takes.  Pt with multiple CAD RFs willing to participate in CR exericse.  Marden Noble is tolerating exercise well.  His workloads are increasing every week and vitals are remaining stable.      Expected Outcomes  Pt will continue to participate in CR exercise,  nutrition, and lifestyle modification opportunities.  Pt will continue to participate in CR exercise, nutrition, and lifestyle modification opportunities.         Core Components/Risk Factors/Patient Goals at Discharge (Final Review):  Goals and Risk Factor Review - 05/21/19 1233      Core Components/Risk Factors/Patient Goals Review   Personal Goals Review  Stress;Hypertension;Lipids;Weight Management/Obesity    Review  Pt with multiple CAD RFs willing to participate in CR exericse.  Marden Noble is tolerating exercise well.  His workloads are increasing every week and vitals are remaining stable.    Expected Outcomes  Pt will continue to participate in CR exercise, nutrition, and lifestyle modification opportunities.       ITP Comments: ITP Comments    Row Name 04/07/19 0715 04/13/19 1440 05/21/19 1225       ITP Comments  Medical Director- Dr. Fransico Him, MD  30 Day ITP Review.  Pt started exercise today and tolerated it well.  3o Day ITP Review.  Marden Noble continues to tolerate exercise well.  His workloads increase every week and his vitals remain stable.        Comments: See ITP Comments.

## 2019-05-22 ENCOUNTER — Encounter (HOSPITAL_COMMUNITY)
Admission: RE | Admit: 2019-05-22 | Discharge: 2019-05-22 | Disposition: A | Discharge status: Home / Self Care | Payer: 59 | Source: Ambulatory Visit | Attending: Cardiology | Admitting: Cardiology

## 2019-05-22 ENCOUNTER — Other Ambulatory Visit: Payer: Self-pay

## 2019-05-22 DIAGNOSIS — Z955 Presence of coronary angioplasty implant and graft: Secondary | ICD-10-CM | POA: Diagnosis not present

## 2019-05-22 DIAGNOSIS — I214 Non-ST elevation (NSTEMI) myocardial infarction: Secondary | ICD-10-CM

## 2019-05-25 ENCOUNTER — Other Ambulatory Visit: Payer: Self-pay

## 2019-05-25 ENCOUNTER — Encounter (HOSPITAL_COMMUNITY)
Admission: RE | Admit: 2019-05-25 | Discharge: 2019-05-25 | Disposition: A | Discharge status: Home / Self Care | Payer: 59 | Source: Ambulatory Visit | Attending: Cardiology | Admitting: Cardiology

## 2019-05-25 DIAGNOSIS — Z955 Presence of coronary angioplasty implant and graft: Secondary | ICD-10-CM | POA: Diagnosis not present

## 2019-05-25 DIAGNOSIS — I214 Non-ST elevation (NSTEMI) myocardial infarction: Secondary | ICD-10-CM | POA: Diagnosis not present

## 2019-05-26 ENCOUNTER — Other Ambulatory Visit: Payer: Self-pay | Admitting: Cardiology

## 2019-05-26 DIAGNOSIS — I493 Ventricular premature depolarization: Secondary | ICD-10-CM

## 2019-05-27 ENCOUNTER — Other Ambulatory Visit: Payer: Self-pay

## 2019-05-27 ENCOUNTER — Encounter (HOSPITAL_COMMUNITY)
Admission: RE | Admit: 2019-05-27 | Discharge: 2019-05-27 | Disposition: A | Discharge status: Home / Self Care | Payer: 59 | Source: Ambulatory Visit | Attending: Cardiology | Admitting: Cardiology

## 2019-05-27 DIAGNOSIS — Z955 Presence of coronary angioplasty implant and graft: Secondary | ICD-10-CM

## 2019-05-27 DIAGNOSIS — I214 Non-ST elevation (NSTEMI) myocardial infarction: Secondary | ICD-10-CM

## 2019-05-29 ENCOUNTER — Encounter (HOSPITAL_COMMUNITY)
Admission: RE | Admit: 2019-05-29 | Discharge: 2019-05-29 | Disposition: A | Discharge status: Home / Self Care | Payer: 59 | Source: Ambulatory Visit | Attending: Cardiology | Admitting: Cardiology

## 2019-05-29 ENCOUNTER — Other Ambulatory Visit: Payer: Self-pay

## 2019-05-29 VITALS — BP 114/58 | HR 86 | Temp 97.7°F | Ht 71.0 in | Wt 211.2 lb

## 2019-05-29 DIAGNOSIS — Z955 Presence of coronary angioplasty implant and graft: Secondary | ICD-10-CM

## 2019-05-29 DIAGNOSIS — I214 Non-ST elevation (NSTEMI) myocardial infarction: Secondary | ICD-10-CM | POA: Diagnosis not present

## 2019-05-29 NOTE — Progress Notes (Signed)
Discharge Progress Report  Patient Details  Name: Shane Kerr MRN: [id number] Date of Birth: 03/12/1956 Referring Provider:     CARDIAC REHAB PHASE II ORIENTATION from 04/07/2019 in [place]  Referring Provider  Vernell Leep, MD       Number of Visits: 20  Reason for Discharge:  Patient reached a stable level of exercise. Patient independent in their exercise. Patient has met program and personal goals.  Smoking History:  Social History   Tobacco Use  Smoking Status Never Smoker  Smokeless Tobacco Never Used    Diagnosis:  01/04/19 NSTEMI  01/05/19 PTCA/Dx  ADL UCSD:   Initial Exercise Prescription: Initial Exercise Prescription - 04/07/19 1200      Date of Initial Exercise RX and Referring Provider   Date  04/07/19    Referring Provider  Vernell Leep, MD    Expected Discharge Date  05/22/19      Recumbant Bike   Level  3    Watts  45    Minutes  15    METs  3.46      NuStep   Level  3    SPM  85    Minutes  15    METs  3      Prescription Details   Frequency (times per week)  3    Duration  Progress to 30 minutes of continuous aerobic without signs/symptoms of physical distress      Intensity   THRR 40-80% of Max Heartrate  62-125    Ratings of Perceived Exertion  11-13    Perceived Dyspnea  0-4      Progression   Progression  Continue to progress workloads to maintain intensity without signs/symptoms of physical distress.      Resistance Training   Training Prescription  Yes    Weight  5lbs    Reps  10-15       Discharge Exercise Prescription (Final Exercise Prescription Changes): Exercise Prescription Changes - 05/29/19 1102      Response to Exercise   Blood Pressure (Admit)  114/58    Blood Pressure (Exercise)  158/82    Blood Pressure (Exit)  120/80    Heart Rate (Admit)  86 bpm    Heart Rate (Exercise)  115 bpm    Heart Rate (Exit)  80 bpm    Rating of Perceived Exertion (Exercise)   13    Symptoms  None    Comments  Patient completed the phase 2 cardiac rehab program.    Duration  Continue with 30 min of aerobic exercise without signs/symptoms of physical distress.    Intensity  THRR unchanged      Progression   Progression  Continue to progress workloads to maintain intensity without signs/symptoms of physical distress.    Average METs  4      Resistance Training   Training Prescription  Yes    Weight  6lbs    Reps  10-15    Time  10 Minutes      Treadmill   MPH  2.5    Grade  2    Minutes  15    METs  3.6      NuStep   Level  8    SPM  85    Minutes  15    METs  4.4      Home Exercise Plan   Plans to continue exercise at  Home (comment)   Elliptical, treadmill at home.  Frequency  Add 2 additional days to program exercise sessions.    Initial Home Exercises Provided  04/20/19       Functional Capacity: 6 Minute Walk    Row Name 04/07/19 0800 05/22/19 1101       6 Minute Walk   Phase  Initial  Discharge    Distance  1575 feet  1971 feet    Distance % Change  -  25.14 %    Walk Time  6 minutes  6 minutes    # of Rest Breaks  0  0    MPH  2.98  3.73    METS  3.44  4.51    RPE  11  13    Perceived Dyspnea   0  0    VO2 Peak  12.04  15.79    Symptoms  No  No    Resting HR  69 bpm  84 bpm    Resting BP  102/60  112/56    Resting Oxygen Saturation   97 %  -    Exercise Oxygen Saturation  during 6 min walk  98 %  -    Max Ex. HR  91 bpm  105 bpm    Max Ex. BP  120/78  148/82    2 Minute Post BP  102/78  102/62       Psychological, QOL, Others - Outcomes: PHQ 2/9: Depression screen [facility] 2/9 04/07/2019 11/25/2015  Decreased Interest 0 0  Down, Depressed, Hopeless 1 1  PHQ - 2 Score 1 1    Quality of Life: Quality of Life - 05/29/19 1638      Quality of Life   Select  Quality of Life      Quality of Life Scores   Health/Function Pre  13.2 %    Health/Function Post  15.42 %    Health/Function % Change  16.82 %    Socioeconomic  Pre  24 %    Socioeconomic Post  19.19 %    Socioeconomic % Change   -20.04 %    Psych/Spiritual Pre  20 %    Psych/Spiritual Post  18.36 %    Psych/Spiritual % Change  -8.2 %    Family Pre  18 %    Family Post  17.9 %    Family % Change  -0.56 %    GLOBAL Pre  17.65 %    GLOBAL Post  17.39 %    GLOBAL % Change  -1.47 %       Personal Goals: Goals established at orientation with interventions provided to work toward goal. Personal Goals and Risk Factors at Admission - 04/07/19 1348      Core Components/Risk Factors/Patient Goals on Admission    Weight Management  Yes;Obesity    Expected Outcomes  Short Term: Continue to assess and modify interventions until short term weight is achieved;Long Term: Adherence to nutrition and physical activity/exercise program aimed toward attainment of established weight goal    Hypertension  Yes    Intervention  Provide education on lifestyle modifcations including regular physical activity/exercise, weight management, moderate sodium restriction and increased consumption of fresh fruit, vegetables, and low fat dairy, alcohol moderation, and smoking cessation.;Monitor prescription use compliance.    Lipids  Yes    Intervention  Provide education and support for participant on nutrition & aerobic/resistive exercise along with prescribed medications to achieve LDL <22m, HDL >468m    Expected Outcomes  Short Term: Participant states understanding of desired  cholesterol values and is compliant with medications prescribed. Participant is following exercise prescription and nutrition guidelines.;Long Term: Cholesterol controlled with medications as prescribed, with individualized exercise RX and with personalized nutrition plan. Value goals: LDL < 69m, HDL > 40 mg.    Stress  Yes    Intervention  Offer individual and/or small group education and counseling on adjustment to heart disease, stress management and health-related lifestyle change. Teach and  support self-help strategies.;Refer participants experiencing significant psychosocial distress to appropriate mental health specialists for further evaluation and treatment. When possible, include family members and significant others in education/counseling sessions.    Expected Outcomes  Short Term: Participant demonstrates changes in health-related behavior, relaxation and other stress management skills, ability to obtain effective social support, and compliance with psychotropic medications if prescribed.;Long Term: Emotional wellbeing is indicated by absence of clinically significant psychosocial distress or social isolation.    Personal Goal Other  Yes    Personal Goal  Patient would like a better understanding of his health in general and in regards to cardiac health.    Intervention  Provide education on heart disease and risk factor modification to help patient better know his risk factors and how to modify those risk factors.    Expected Outcomes  Patient will understand his risk factors for heart disease. Patient will make necessary lifestyle changes to help reduce risk for heart disease.        Personal Goals Discharge: Goals and Risk Factor Review    Row Name 04/13/19 1442 05/21/19 1233 05/29/19 1645         Core Components/Risk Factors/Patient Goals Review   Personal Goals Review  Stress;Hypertension;Lipids;Weight Management/Obesity  Stress;Hypertension;Lipids;Weight Management/Obesity  Stress;Hypertension;Lipids;Weight Management/Obesity     Review  Pt with multiple CAD RFs willing to participate in CR exericse.  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  Pt with multiple CAD RFs willing to participate in CR exericse.  DMarden Nobleis tolerating exercise well.  His workloads are increasing every week and vitals are remaining stable.  [facility]  He tolerated exerciser very well with  increased workloads.     Expected Outcomes  Pt will continue to participate in CR exercise, nutrition, and lifestyle modification opportunities.  Pt will continue to participate in CR exercise, nutrition, and lifestyle modification opportunities.  Pt will continue to participate in exercise, nutrition, and lifestyle modification opportunities.  Doug plans to waalk and use his exercise equipment at home, treadmill and ellipitical.        Exercise Goals and Review: Exercise Goals    Row Name 04/07/19 0726             Exercise Goals   Increase Physical Activity  Yes       Intervention  Provide advice, education, support and counseling about physical activity/exercise needs.;Develop an individualized exercise prescription for aerobic and resistive training based on initial evaluation findings, risk stratification, comorbidities and participant's personal goals.       Expected Outcomes  Short Term: Attend rehab on a regular basis to increase amount of physical activity.;Long Term: Exercising regularly at least 3-5 days a week.;Long Term: Add in home exercise to make exercise part of routine and to increase amount of physical activity.       Increase Strength and Stamina  Yes       Intervention  Provide advice, education, support and counseling about physical activity/exercise needs.;Develop an  individualized exercise prescription for aerobic and resistive training based on initial evaluation findings, risk stratification, comorbidities and participant's personal goals.       Expected Outcomes  Short Term: Increase workloads from initial exercise prescription for resistance, speed, and METs.;Short Term: Perform resistance training exercises routinely during rehab and add in resistance training at home;Long Term: Improve cardiorespiratory fitness, muscular endurance and strength as measured by increased METs and functional capacity (6MWT)       Able to understand and use rate of perceived exertion (RPE)  scale  Yes       Intervention  Provide education and explanation on how to use RPE scale       Expected Outcomes  Short Term: Able to use RPE daily in rehab to express subjective intensity level;Long Term:  Able to use RPE to guide intensity level when exercising independently       Knowledge and understanding of Target Heart Rate Range (THRR)  Yes       Intervention  Provide education and explanation of THRR including how the numbers were predicted and where they are located for reference       Expected Outcomes  Short Term: Able to state/look up THRR;Long Term: Able to use THRR to govern intensity when exercising independently;Short Term: Able to use daily as guideline for intensity in rehab       Able to check pulse independently  Yes       Intervention  Provide education and demonstration on how to check pulse in carotid and radial arteries.;Review the importance of being able to check your own pulse for safety during independent exercise       Expected Outcomes  Short Term: Able to explain why pulse checking is important during independent exercise;Long Term: Able to check pulse independently and accurately       Understanding of Exercise Prescription  Yes       Intervention  Provide education, explanation, and written materials on patient's individual exercise prescription       Expected Outcomes  Short Term: Able to explain program exercise prescription;Long Term: Able to explain home exercise prescription to exercise independently          Exercise Goals Re-Evaluation: Exercise Goals Re-Evaluation    Row Name 04/13/19 1522 04/20/19 1119 05/18/19 1112 05/22/19 1115 05/29/19 1148     Exercise Goal Re-Evaluation   Exercise Goals Review  Increase Physical Activity;Understanding of Exercise Prescription;Increase Strength and Stamina;Knowledge and understanding of Target Heart Rate Range (THRR);Able to understand and use rate of perceived exertion (RPE) scale  Increase Physical  Activity;Understanding of Exercise Prescription;Increase Strength and Stamina;Knowledge and understanding of Target Heart Rate Range (THRR);Able to understand and use rate of perceived exertion (RPE) scale  Increase Physical Activity;Understanding of Exercise Prescription;Increase Strength and Stamina;Knowledge and understanding of Target Heart Rate Range (THRR);Able to understand and use rate of perceived exertion (RPE) scale  Increase Physical Activity;Understanding of Exercise Prescription;Increase Strength and Stamina;Knowledge and understanding of Target Heart Rate Range (THRR);Able to understand and use rate of perceived exertion (RPE) scale  Increase Physical Activity;Understanding of Exercise Prescription;Increase Strength and Stamina;Knowledge and understanding of Target Heart Rate Range (THRR);Able to understand and use rate of perceived exertion (RPE) scale   Comments  Pt first day of exercise. Pt tolerated exercise prescription well. Pt understands THRR, RPE scale, warm up and cool down stretches, and resistance exercises. METs and goals reviewed with Pt. Pt understands goals and responsive to MET information.  Reviewed home exercise guidelines with patient  and how to count pulse, hand out given. Pt has an elliptical and treadmill at home. Pt plans to start using elliptical as his mode of home exercise since it's easier on his knees than the TM. Pt aslo has weight machine at home. Pt's goal is to know his parameter for activity.  Patient feels like he's doing well with with his exercise. Patient is using his elliptical machine at home 15 minutes, 2 days/week. Pt plans to increase to 15 minutes BID, 2 days/week.  Patient's functional capacity increased 25% as measured by 6MWT, and strength increased 29% as measured by grip strength test.  Patient has completed the phase 2 cardiac rehab program and has done well. Patient has lost 2.1 kg since orientation. Patient has an elliptical machine, treadmill, and  weight machine at home, which he plans to use as his mode of exercise. Pt plans to use elliptical and treadmill 15 minutes each, 5 days/week.   Expected Outcomes  Will continue to monitor and progress Pt as tolerated.  Pt will add 30 minutes of aerobic exercise at home at least 2 days/week in addition to exercise at cardiac rehab to help achieve personal health and fitness goals.  Patient will increase home exercise from 15 minutes, 2 days/week to 15 minutes twice/day, 2 days/week.  Patient will continue home exercise routine to help maintain strength and stamina gains.  Patient will exercise 30 minutes, at least 5 days/week to maintain health and fitness gains.      Nutrition & Weight - Outcomes: Pre Biometrics - 04/07/19 0721      Pre Biometrics   Height  _0  (1.803 m)    Weight  97.9 kg    Waist Circumference  40.75 inches    Hip Circumference  41.75 inches    Waist to Hip Ratio  0.98 %    BMI (Calculated)  30.12    Triceps Skinfold  20 mm    % Body Fat  29.5 %    Grip Strength  39.5 kg    Flexibility  9 in    Single Leg Stand  30 seconds      Post Biometrics - 05/29/19 1102       Post  Biometrics   Height  _1  (1.803 m)    Weight  95.8 kg    Waist Circumference  39.5 inches    Hip Circumference  39.75 inches    Waist to Hip Ratio  0.99 %    BMI (Calculated)  29.47    Triceps Skinfold  15 mm    % Body Fat  27.5 %    Grip Strength  51 kg    Flexibility  8 in    Single Leg Stand  30 seconds       Nutrition:   Nutrition Discharge:   Education Questionnaire Score: Knowledge Questionnaire Score - 05/29/19 1148      Knowledge Questionnaire Score   Pre Score  20/24    Post Score  20/24       Goals reviewed with patient; copy given to patient.

## 2019-06-29 ENCOUNTER — Other Ambulatory Visit: Payer: Self-pay | Admitting: Cardiology

## 2019-06-29 DIAGNOSIS — I493 Ventricular premature depolarization: Secondary | ICD-10-CM

## 2019-07-20 ENCOUNTER — Other Ambulatory Visit: Payer: Self-pay | Admitting: Cardiology

## 2019-07-20 DIAGNOSIS — I493 Ventricular premature depolarization: Secondary | ICD-10-CM

## 2019-08-12 ENCOUNTER — Ambulatory Visit: Payer: 59 | Admitting: Cardiology

## 2019-08-12 ENCOUNTER — Telehealth: Payer: 59 | Admitting: Cardiology

## 2019-08-13 DIAGNOSIS — I2510 Atherosclerotic heart disease of native coronary artery without angina pectoris: Secondary | ICD-10-CM | POA: Diagnosis not present

## 2019-08-14 LAB — LIPID PANEL
Chol/HDL Ratio: 1.8 ratio (ref 0.0–5.0)
Cholesterol, Total: 92 mg/dL — ABNORMAL LOW (ref 100–199)
HDL: 51 mg/dL (ref >39)
LDL Chol Calc (NIH): 29 mg/dL (ref 0–99)
Triglycerides: 47 mg/dL (ref 0–149)
VLDL Cholesterol Cal: 12 mg/dL (ref 5–40)

## 2019-08-19 ENCOUNTER — Telehealth (INDEPENDENT_AMBULATORY_CARE_PROVIDER_SITE_OTHER): Payer: 59 | Admitting: Cardiology

## 2019-08-19 VITALS — BP 122/70 | HR 80 | Wt 215.0 lb

## 2019-08-19 DIAGNOSIS — I493 Ventricular premature depolarization: Secondary | ICD-10-CM | POA: Diagnosis not present

## 2019-08-19 DIAGNOSIS — I2510 Atherosclerotic heart disease of native coronary artery without angina pectoris: Secondary | ICD-10-CM | POA: Diagnosis not present

## 2019-08-19 NOTE — Progress Notes (Signed)
Follow up visit  Subjective:   Shane Kerr, male    DOB: 09/27/1955, 63 y.o.   MRN: [id number]  I connected with the patient on 08/19/2019 by a video enabled telemedicine application and verified that I am speaking with the correct person using two identifiers.     I discussed the limitations of evaluation and management by telemedicine and the availability of in person appointments. The patient expressed understanding and agreed to proceed.   This visit type was conducted due to national recommendations for restrictions regarding the COVID-19 Pandemic (e.g. social distancing).  This format is felt to be most appropriate for this patient at this time.  All issues noted in this document were discussed and addressed.  No physical exam was performed (except for noted visual exam findings with Tele health visits).  The patient has consented to conduct a Tele health visit and understands insurance will be billed.    Chief Complaint  Patient presents with  . Coronary Artery Disease    HPI  63 y/o Caucasian male with CAD, NSTEMI 01/2019.  Patient was [facility] 01/2019 with chest pain. Trop (old assay) was elevated at 1.07 ng/mL. He was diagnosed with NSTEMI. He underwent balloon angioplasty to diagonal, details below. He also has moderate disease in pLAD. Subsequently, patient underwent nuclear stress test that showed no ischemia/infarct.   Patient went through cardiac rehab, which helped him a lot. He has been walking on treadmill for up to 30 min, without any chest pain, shortness of breath symptoms. He feels generalized sluggishness.  He wonders if any of this could be due to any of his medications.  On a separate note, he endorses snoring at night.  He has never had a sleep study done.   Past Medical History:  Diagnosis Date  . Coronary artery disease   . Depression   . HTN (hypertension) 01/14/2019  . Hyperlipemia 01/14/2019  . NSTEMI (non-ST elevated  myocardial infarction) [facility](HCC)      Past Surgical History:  Procedure Laterality Date  . BACK SURGERY    . CARDIAC CATHETERIZATION    . CORONARY BALLOON ANGIOPLASTY N/A 01/05/2019   Procedure: CORONARY BALLOON ANGIOPLASTY;  Surgeon: Elder NegusPatwardhan, Altus Zaino J, MD;  Location: [facility] IN[facility];  Service: Cardiovascular;  Laterality: N/A;  . KNEE ARTHROSCOPY Left   . LEFT HEART CATH AND CORONARY ANGIOGRAPHY N/A 01/05/2019   Procedure: LEFT HEART CATH AND CORONARY ANGIOGRAPHY;  Surgeon: Elder NegusPatwardhan, Shakti Fleer J, MD;  Location: [facility] INVASIVE CV LAB;  Service: Cardiovascular;  Laterality: N/A;  . LUMBAR LAMINECTOMY/DECOMPRESSION MICRODISCECTOMY Left 07/03/2018   Procedure: Left Lumbar five Sacral one Microdiscectomy;  Surgeon: Maeola HarmanStern, Joseph, MD;  [facility];  Service: Neurosurgery;  Laterality: Left;  . LUMBAR MICRODISCECTOMY Left 07/03/2018   Left Lumbar five Sacral one Microdiscectomy  . TONSILLECTOMY  1960s  . TRIGGER FINGER RELEASE Right 06/2014   "middle finger"     Social History   Socioeconomic History  . Marital status: Married    Spouse name: Not on file  . Number of children: 1  . Years of education: 3116  . Highest education level: Not on file  Occupational History  . Not on file  Tobacco Use  . Smoking status: Never Smoker  . Smokeless tobacco: Never Used  Substance and Sexual Activity  . Alcohol use: Yes    Comment: 07/03/2018 "might have 1 beer/month; if that"  . Drug use: Never  . Sexual activity: Not Currently  Other  Topics Concern  . Not on file  Social History Narrative  . Not on file   Social Determinants of Health   Financial Resource Strain: Low Risk   . Difficulty of Paying Living Expenses: Not hard at all  Food Insecurity: No Food Insecurity  . Worried About Programme researcher, broadcasting/film/video in the Last Year: Never true  . Ran Out of Food in the Last Year: Never true  Transportation Needs: No Transportation Needs  . Lack of Transportation (Medical): No  . Lack of  Transportation (Non-Medical): No  Physical Activity: Inactive  . Days of Exercise per Week: 0 days  . Minutes of Exercise per Session: 0 min  Stress: Stress Concern Present  . Feeling of Stress : Rather much  Social Connections:   . Frequency of Communication with Friends and Family: Not on file  . Frequency of Social Gatherings with Friends and Family: Not on file  . Attends Religious Services: Not on file  . Active Member of Clubs or Organizations: Not on file  . Attends Banker Meetings: Not on file  . Marital Status: Not on file  Intimate Partner Violence:   . Fear of Current or Ex-Partner: Not on file  . Emotionally Abused: Not on file  . Physically Abused: Not on file  . Sexually Abused: Not on file     Family History  Problem Relation Age of Onset  . Heart disease Mother   . Arthritis Mother   . Diabetes Father      Current Outpatient Medications on File Prior to Visit  Medication Sig Dispense Refill  . aspirin EC 81 MG EC tablet Take 1 tablet (81 mg total) by mouth daily. 90 tablet 2  . atorvastatin (LIPITOR) 80 MG tablet Take 1 tablet (80 mg total) by mouth daily at 6 PM. 90 tablet 2  . carvedilol (COREG) 3.125 MG tablet TAKE 1 TABLET (3.125 MG TOTAL) BY MOUTH 2 (TWO) TIMES DAILY. 180 tablet 0  . nitroGLYCERIN (NITROSTAT) 0.4 MG SL tablet Place 1 tablet (0.4 mg total) under the tongue every 5 (five) minutes as needed for chest pain (Up to 3 doses in 15 minutes.). 30 tablet 2  . sertraline (ZOLOFT) 50 MG tablet Take 50 mg by mouth daily.    . ticagrelor (BRILINTA) 90 MG TABS tablet Take 1 tablet (90 mg total) by mouth 2 (two) times daily. 180 tablet 3   No current facility-administered medications on file prior to visit.    Cardiovascular studies:  08/13/2019: Chol 92, TG 47, HDL 51, LDL 29  01/04/2019: Chol 138, TG 31, HDL 57, LDL 75   Lexiscan Sestamibi Stress Test 02/09/2019: Stress EKG is non-diagnostic, as this is pharmacological stress  test. Normal myocardial perfusion. Stress LV EF: 57%.  Low risk study.  EKG 01/14/2019: Sinus rhythm 72 bpm. Frequent PVC's Unchanged compared to previous EKG.  Telemetry: Frequent single PVC's   Coronary angiography 01/05/2019: LM: Normal LAD: Prox 50% focal stenosis. Mid 60-70% diffuse disease Culprit artery D2 with ostial 90% and lateral branch ostial 75% stenosis Successful balloon angioplasty with 20% residual stenosis. LCx: Normal RCA: RPDA 40% disease Normal LVEDP Normal LVEF  Recommendation: Aggressive medical management. DAPT for 1 year High intensity statin Lipitor 40 mg, metoprolol 25 mg bid. Diet and lifestyle modifications, cardiac rehab   Echocardiogram05/12/2018: 1. The left ventricle has normal systolic function, with an ejection fraction of 60-65%. The cavity size was normal. Left ventricular diastolic Doppler parameters are consistent with impaired relaxation. No  evidence of left ventricular regional wall  motion abnormalities. 2. No significant valvular abnormalities.    Review of Systems  Constitution: Positive for malaise/fatigue. Negative for decreased appetite, weight gain and weight loss.       Sleepiness  HENT: Negative for congestion.   Eyes: Negative for visual disturbance.  Cardiovascular: Negative for chest pain, dyspnea on exertion, leg swelling, palpitations and syncope.  Respiratory: Negative for cough.   Endocrine: Negative for cold intolerance.  Hematologic/Lymphatic: Does not bruise/bleed easily.  Skin: Negative for itching and rash.  Musculoskeletal: Negative for myalgias.  Gastrointestinal: Negative for abdominal pain, nausea and vomiting.  Genitourinary: Negative for dysuria.  Neurological: Positive for dizziness. Negative for weakness.  Psychiatric/Behavioral: The patient is not nervous/anxious.   All other systems reviewed and are negative.        Vitals:   08/03/19 1454  BP: 122/70  Pulse: 80     Objective:   Physical Exam  Constitutional: He is oriented to person, place, and time. He appears well-developed and well-nourished. No distress.  Pulmonary/Chest: Effort normal.  Neurological: He is alert and oriented to person, place, and time.  Psychiatric: He has a normal mood and affect.  Nursing note and vitals reviewed.         Assessment & Recommendations:   63 y/o Caucasian male with CAD, NSTEMI 01/2019.  CAD: Currently no angina symptoms. NSTEMI in 01/2019 with culprit vessel ostial diagonal treated with balloon angioplasty. He has residual disease in nonculprit pLAD. Lexiscan nuclear stress test 02/2019 with no ischemia/infarction. Continue Aspirin and Brilinta till 01/2020. LDL 29 on Lipitor 80 mg daily. Currently on carvedilol 3.125 mg twice daily.  I offered him to switch to different beta-blocker given his generalized fatigue symptoms. I also offered him referral to sleep study. However, he would like to get through the holidays without make any further change.   Frequent PVC's: Reportedly, this has been present for a long time. Normal EF. He asymptomatic from PVC's. He did not tolerate metoprolol due to fatigue, but is tolerating coreg 3.125 mg bid well.   Dizziness: Resolved.   F/u in 6 months   Wynne Jury Esther Hardy, MD [facility] [place] Pager: [phone] Office: [phone] If no answer Cell [phone]

## 2019-09-21 ENCOUNTER — Other Ambulatory Visit: Payer: Self-pay | Admitting: Cardiology

## 2019-09-21 DIAGNOSIS — I493 Ventricular premature depolarization: Secondary | ICD-10-CM

## 2019-09-25 ENCOUNTER — Other Ambulatory Visit: Payer: Self-pay | Admitting: Cardiology

## 2019-11-19 DIAGNOSIS — R69 Illness, unspecified: Secondary | ICD-10-CM | POA: Diagnosis not present

## 2019-11-19 DIAGNOSIS — R42 Dizziness and giddiness: Secondary | ICD-10-CM | POA: Diagnosis not present

## 2019-12-05 ENCOUNTER — Other Ambulatory Visit: Payer: Self-pay | Admitting: Cardiology

## 2019-12-05 DIAGNOSIS — I493 Ventricular premature depolarization: Secondary | ICD-10-CM

## 2019-12-10 ENCOUNTER — Ambulatory Visit: Payer: 59 | Admitting: Rehabilitative and Restorative Service Providers"

## 2019-12-10 ENCOUNTER — Other Ambulatory Visit: Payer: Self-pay

## 2019-12-10 DIAGNOSIS — R42 Dizziness and giddiness: Secondary | ICD-10-CM

## 2019-12-10 DIAGNOSIS — H8111 Benign paroxysmal vertigo, right ear: Secondary | ICD-10-CM | POA: Diagnosis not present

## 2019-12-10 NOTE — Therapy (Signed)
[facility] [place] [address] [place], [place], [address] Phone: [phone]   Fax:  [phone]  Physical Therapy Evaluation  Patient Details  Name: Shane Kerr MRN: [id number] Date of Birth: 1955/11/21 Referring Provider (PT): Silvestre Moment, MD   Encounter Date: 12/10/2019  PT End of Session - 12/10/19 0940    Visit Number  1    Number of Visits  4    Date for PT Re-Evaluation  01/21/20    Authorization Type  aetna    PT Start Time  0935    PT Stop Time  1015    PT Time Calculation (min)  40 min    Activity Tolerance  Patient tolerated treatment well    Behavior During Therapy  [facility] for tasks assessed/performed       Past Medical History:  Diagnosis Date  . Coronary artery disease   . Depression   . HTN (hypertension) 01/14/2019  . Hyperlipemia 01/14/2019  . NSTEMI (non-ST elevated myocardial infarction) [facility])     Past Surgical History:  Procedure Laterality Date  . BACK SURGERY    . CARDIAC CATHETERIZATION    . CORONARY BALLOON ANGIOPLASTY N/A 01/05/2019   Procedure: CORONARY BALLOON ANGIOPLASTY;  Surgeon: Elder Negus, MD;  Location: [facility] INVASIVE CV LAB;  Service: Cardiovascular;  Laterality: N/A;  . KNEE ARTHROSCOPY Left   . LEFT HEART CATH AND CORONARY ANGIOGRAPHY N/A 01/05/2019   Procedure: LEFT HEART CATH AND CORONARY ANGIOGRAPHY;  Surgeon: Elder Negus, MD;  Location: [facility] INVASIVE CV LAB;  Service: Cardiovascular;  Laterality: N/A;  . LUMBAR LAMINECTOMY/DECOMPRESSION MICRODISCECTOMY Left 07/03/2018   Procedure: Left Lumbar five Sacral one Microdiscectomy;  Surgeon: Maeola Harman, MD;  Location: [facility] OR;  Service: Neurosurgery;  Laterality: Left;  . LUMBAR MICRODISCECTOMY Left 07/03/2018   Left Lumbar five Sacral one Microdiscectomy  . TONSILLECTOMY  1960s  . TRIGGER FINGER RELEASE Right 06/2014   "middle finger"    There were no vitals filed for this visit.   Subjective Assessment -  12/10/19 0938    Subjective  The patient is known to PT from prior physical therapy for vestibular rehab (R BPPV).  He has had a return of BPPV noted when he rises, not when he rolls in bed.  He describes a lightheaded sensation.  He continues with bilateral tinnitus.    Pertinent History  CAD, NSTEMI,  hyperlipidemia. HTN, low back surgery 06/2018.    Patient Stated Goals  Reduce sensation of lightheadedness with position changes.    Currently in Pain?  No/denies         [facility] PT Assessment - 12/10/19 0941      Assessment   Medical Diagnosis  vertigo    Referring Provider (PT)  Silvestre Moment, MD    Onset Date/Surgical Date  12/03/19   getting more frequent   Prior Therapy  none      Restrictions   Weight Bearing Restrictions  No      Balance Screen   Has the patient fallen in the past 6 months  No    Has the patient had a decrease in activity level because of a fear of falling?   No    Is the patient reluctant to leave their home because of a fear of falling?   No      Home Public house manager residence    Living Arrangements  Spouse/significant other    Type of Home  House  Prior Function   Level of Independence  Independent           Vestibular Assessment - 12/10/19 0942      Vestibular Assessment   General Observation  The patient walks into clinic independently without a device      Symptom Behavior   Subjective history of current problem  slow, gradual return of prior dizziness     Type of Dizziness   Lightheadedness    Frequency of Dizziness  daily    Duration of Dizziness  seconds    Symptom Nature  Positional    Aggravating Factors  --   squating, kneeling   Relieving Factors  --   avoiding squating   Progression of Symptoms  Worse    History of similar episodes  h/o BPPV, right side in 2020      Oculomotor Exam   Oculomotor Alignment  Abnormal   mild R hypertropia noted   Ocular ROM  WFLs    Spontaneous  Absent     Gaze-induced   Absent    Smooth Pursuits  Intact    Saccades  Intact      Vestibulo-Ocular Reflex   VOR 1 Head Only (x 1 viewing)  after 5 reps notes "part of my head is lagging" ; notes his eyes have to catch up    Comment  head impulse test= positive to both sides indicating diminished VOR      Positional Testing   Dix-Hallpike  Dix-Hallpike Right;Dix-Hallpike Left    Sidelying Test  Sidelying Right;Sidelying Left    Horizontal Canal Testing  Horizontal Canal Right;Horizontal Canal Left      Dix-Hallpike Right   Dix-Hallpike Right Duration  5-10 second duration of spinning sensation    Dix-Hallpike Right Symptoms  No nystagmus   viewed in room light; although spinning reported     Dix-Hallpike Left   Dix-Hallpike Left Duration  mild trace of dizziness after 8 seconds in position, feels a "lagging" sensation with return to sitting    Dix-Hallpike Left Symptoms  No nystagmus      Sidelying Right   Sidelying Right Duration  none    Sidelying Right Symptoms  No nystagmus      Sidelying Left   Sidelying Left Duration  trace "eyes and head catching up"    Sidelying Left Symptoms  No nystagmus      Horizontal Canal Right   Horizontal Canal Right Duration  none    Horizontal Canal Right Symptoms  Normal      Horizontal Canal Left   Horizontal Canal Left Duration  none    Horizontal Canal Left Symptoms  Normal      Orthostatics   BP supine (x 5 minutes)  130/69    BP standing (after 1 minute)  118/69    HR standing (after 1 minute)  70    BP standing (after 3 minutes)  122/79    HR standing (after 3 minutes)  68    Orthostatics Comment  mild lightheadedness with initial rising to stand           Objective measurements completed on examination: See above findings.       Vestibular Treatment/Exercise - 12/10/19 0959      Vestibular Treatment/Exercise   Vestibular Treatment Provided  Canalith Repositioning;Gaze    Canalith Repositioning  Epley Manuever Right     Gaze Exercises  X1 Viewing Horizontal       EPLEY MANUEVER RIGHT   Number of Reps  1      X1 Viewing Horizontal   Foot Position  seated    Comments  30 seconds with cues to maintain gaze fixation and avoid stopping in the middle            PT Education - 12/10/19 1249    Education Details  established HEP for gaze adpatation    Person(s) Educated  Patient    Methods  Explanation;Demonstration;Handout    Comprehension  Verbalized understanding;Returned demonstration          PT Long Term Goals - 12/10/19 1009      PT LONG TERM GOAL #1   Title  The patient will have no dizziness with R dix hallpike testing    Time  6    Period  Weeks    Target Date  01/21/20      PT LONG TERM GOAL #2   Title  The patient will be able to tolerate gaze x 1 adaptation x 60 seconds.    Baseline  hard to perform x 30 seconds    Time  6    Period  Weeks    Target Date  01/21/20      PT LONG TERM GOAL #3   Title  The patient will subjectively report no dizziness during daily activities.    Time  6    Period  Weeks    Target Date  01/21/20             Plan - 12/10/19 1250    Clinical Impression Statement  The patient is a 64 yo male presenting to OP physical therapy with a vague sense of lightheadedness worse after rising that lasts for seconds.  He has prior h/o R BPPV and had symptoms of trace spinning wiht R dix hallpike today.  He also has a positive Bilat head thrust test indicating diminished VOR.  PT addressed both impairments to initiate tretament.  Orthostatics were taken and were not significant at today's visit (measured BP due to reports of lightheadedness with rising).    Personal Factors and Comorbidities  Comorbidity 2;Comorbidity 1    Comorbidities  NSTEMI, HTN    Examination-Activity Limitations  Squat    Examination-Participation Restrictions  Yard Work    Stability/Clinical Decision Making  Stable/Uncomplicated    Clinical Decision Making  Low    Rehab  Potential  Good    PT Frequency  1x / week    PT Duration  6 weeks    PT Treatment/Interventions  Canalith Repostioning;Vestibular;Therapeutic exercise;Balance training;Gait training;Neuromuscular re-education;Patient/family education;Therapeutic activities    PT Next Visit Plan  reassess for BPPV, review gaze adaptation, add standing multi-sensory balance as indicated to HEP    PT Home Exercise Plan  Access Code: [email or web address]    Consulted and Agree with Plan of Care  Patient       Patient will benefit from skilled therapeutic intervention in order to improve the following deficits and impairments:  Dizziness  Visit Diagnosis: BPPV (benign paroxysmal positional vertigo), right  Dizziness and giddiness     Problem List Patient Active Problem List   Diagnosis Date Noted  . HTN (hypertension) 01/14/2019  . Hyperlipemia 01/14/2019  . Frequent PVCs 01/14/2019  . Coronary artery disease involving native coronary artery of native heart without angina pectoris 01/14/2019  . Herniated lumbar disc without myelopathy 07/03/2018    Yvonnie Schinke, PT 12/10/2019, 12:54 PM  [facility] [address] [place], [place], [address] Phone: [phone]  Fax:  [phone]  Name: LUCAS WINOGRAD MRN: [id number] Date of Birth: 20-Mar-1956

## 2019-12-10 NOTE — Patient Instructions (Signed)
Access Code: [email or web address] URL: [email or web address] Date: 12/10/2019 Prepared by: Margretta Ditty  Exercises Seated Gaze Stabilization with Head Rotation - 3 x daily - 7 x weekly - 1 sets - 1 reps

## 2019-12-24 ENCOUNTER — Encounter: Payer: 59 | Admitting: Rehabilitative and Restorative Service Providers"

## 2019-12-28 ENCOUNTER — Other Ambulatory Visit: Payer: Self-pay | Admitting: Cardiology

## 2020-01-01 ENCOUNTER — Ambulatory Visit: Payer: 59 | Admitting: Rehabilitative and Restorative Service Providers"

## 2020-01-01 ENCOUNTER — Encounter: Payer: Self-pay | Admitting: Rehabilitative and Restorative Service Providers"

## 2020-01-01 ENCOUNTER — Other Ambulatory Visit: Payer: Self-pay

## 2020-01-01 DIAGNOSIS — R42 Dizziness and giddiness: Secondary | ICD-10-CM

## 2020-01-01 DIAGNOSIS — H8111 Benign paroxysmal vertigo, right ear: Secondary | ICD-10-CM

## 2020-01-01 NOTE — Patient Instructions (Signed)
Access Code: [email or web address] URL: [email or web address] Date: 01/01/2020 Prepared by: Margretta Ditty  Exercises Wide Stance with Eyes Closed on Foam Pad - 2 x daily - 7 x weekly - 1 sets - 3 reps - 30 seconds hold Romberg Stance on Foam Pad with Head Rotation - 2 x daily - 7 x weekly - 1 sets - 10 reps Romberg Stance with eyes closed and head rotation - 2 x daily - 7 x weekly - 1 sets - 10 reps

## 2020-01-01 NOTE — Therapy (Addendum)
[place], [place], [address] Phone: [phone]   Fax:  [phone]  Physical Therapy Treatment and On Hold Note/Discharge Summary  Patient Details  Name: Shane Kerr MRN: [id number] Date of Birth: 1955-10-03 Referring Provider (PT): Christella Noa, MD   Encounter Date: 01/01/2020  PT End of Session - 01/01/20 0947    Visit Number  2    Number of Visits  4    Date for PT Re-Evaluation  01/21/20    Authorization Type  aetna    PT Start Time  [phone]    PT Stop Time  1000    PT Time Calculation (min)  29 min    Activity Tolerance  Patient tolerated treatment well    Behavior During Therapy  [facility] for tasks assessed/performed       Past Medical History:  Diagnosis Date  . Coronary artery disease   . Depression   . HTN (hypertension) 01/14/2019  . Hyperlipemia 01/14/2019  . NSTEMI (non-ST elevated myocardial infarction) [facility])     Past Surgical History:  Procedure Laterality Date  . BACK SURGERY    . CARDIAC CATHETERIZATION    . CORONARY BALLOON ANGIOPLASTY N/A 01/05/2019   Procedure: CORONARY BALLOON ANGIOPLASTY;  Surgeon: Nigel Mormon, MD;  Location: [place] [facility];  Service: Cardiovascular;  Laterality: N/A;  . KNEE ARTHROSCOPY Left   . LEFT HEART CATH AND CORONARY ANGIOGRAPHY N/A 01/05/2019   Procedure: LEFT HEART CATH AND CORONARY ANGIOGRAPHY;  Surgeon: Nigel Mormon, MD;  Location: [place] [facility];  Service: Cardiovascular;  Laterality: N/A;  . LUMBAR LAMINECTOMY/DECOMPRESSION MICRODISCECTOMY Left 07/03/2018   Procedure: Left Lumbar five Sacral one Microdiscectomy;  Surgeon: Erline Levine, MD;  Location: [place];  Service: Neurosurgery;  Laterality: Left;  . LUMBAR MICRODISCECTOMY Left 07/03/2018   Left Lumbar five Sacral one Microdiscectomy  . TONSILLECTOMY  1960s  . TRIGGER FINGER RELEASE Right 06/2014   "middle finger"    There were no vitals filed for this  visit.  Subjective Assessment - 01/01/20 0932    Subjective  The patient reports he thinks symptoms are improved for the most part.  He gets occasional episodes of a brief amount of lightheadedness that resolves within seconds when rising in the morning and when getting up in the middle of the night.    Pertinent History  CAD, NSTEMI,  hyperlipidemia. HTN, low back surgery 06/2018.    Patient Stated Goals  Reduce sensation of lightheadedness with position changes.    Currently in Pain?  No/denies             Vestibular Assessment - 01/01/20 0935      Vestibular Assessment   General Observation  The patient has improved since eval      Positional Testing   Dix-Hallpike  Dix-Hallpike Right;Dix-Hallpike Left      Dix-Hallpike Right   Dix-Hallpike Right Duration  none    Dix-Hallpike Right Symptoms  No nystagmus      Dix-Hallpike Left   Dix-Hallpike Left Duration  none    Dix-Hallpike Left Symptoms  No nystagmus               [facility] Adult PT Treatment/Exercise - 01/01/20 1004      Self-Care   Self-Care  Other Self-Care Comments    Other Self-Care Comments   discussed home walking to address h/o MI, h/o low back pain, h/o vertigo, and h/o recent depression.  Recommended comfortable pace gait beginning  at 10-15 minutes and working up to 45 minutes 3 days per week. Discussed multi-sensory balance activities to address remaining deficits and general lightheadedness upon rising in the morning.      Neuro Re-ed    Neuro Re-ed Details   Corner balance standing x eyes closed + foam x 30 seconds, romberg + eyes closed + head motion on solid surface, and foam + head mtion (eyes open).        Vestibular Treatment/Exercise - 01/01/20 0945      Vestibular Treatment/Exercise   Vestibular Treatment Provided  Gaze    Gaze Exercises  X1 Viewing Horizontal      X1 Viewing Horizontal   Foot Position  seated and standing gaze x 1 viewing    Comments  30 seconds with cues to maintain  gaze and increase speed            PT Education - 01/01/20 0957    Education Details  HEP progression    Person(s) Educated  Patient    Methods  Explanation;Demonstration;Handout    Comprehension  Returned demonstration;Verbalized understanding          PT Long Term Goals - 01/01/20 1006      PT LONG TERM GOAL #1   Title  The patient will have no dizziness with R dix hallpike testing    Time  6    Period  Weeks    Status  Achieved      PT LONG TERM GOAL #2   Title  The patient will be able to tolerate gaze x 1 adaptation x 60 seconds.    Baseline  hard to perform x 30 seconds    Time  6    Period  Weeks    Status  Partially Met      PT LONG TERM GOAL #3   Title  The patient will subjectively report no dizziness during daily activities.    Baseline  Notes a trace senstion of lightheadedness upon initial rising in the morning.  Daily activities not hindered by dizziness    Time  6    Period  Weeks    Status  Partially Met            Plan - 01/01/20 1007    Clinical Impression Statement  The patient partially met LTGs.  PT progressed his HEP to include multi-sensory balance challenges.  Patient on hold--will call iin next 30 days if further symtpms arise.    PT Treatment/Interventions  Canalith Repostioning;Vestibular;Therapeutic exercise;Balance training;Gait training;Neuromuscular re-education;Patient/family education;Therapeutic activities    PT Next Visit Plan  on hold, reassess as needed    PT Home Exercise Plan  Access Code: [email or web address]    Consulted and Agree with Plan of Care  Patient       Patient will benefit from skilled therapeutic intervention in order to improve the following deficits and impairments:  Dizziness  Visit Diagnosis: BPPV (benign paroxysmal positional vertigo), right  Dizziness and giddiness     PHYSICAL THERAPY DISCHARGE SUMMARY  Visits from Start of Care: 2  Current functional level related to goals / functional  outcomes: See goals above.   Remaining deficits: Patient was progressing well and placed on hold x 30 days to ensure no return of symptoms.   Education / Equipment: Home program, self mgmt of symptoms.  Plan: Patient agrees to discharge.  Patient goals were met. Patient is being discharged due to meeting the stated rehab goals.  ?????  Thank you for the referral of this patient. Rudell Cobb, MPT  [place], [place] 01/01/2020, 10:08 AM  [facility] [place], [place], [address] Phone: [phone]   Fax:  [phone]  Name: Shane Kerr MRN: [id number] Date of Birth: 06-Dec-1955

## 2020-02-19 ENCOUNTER — Other Ambulatory Visit: Payer: Self-pay

## 2020-02-19 ENCOUNTER — Ambulatory Visit: Payer: 59 | Admitting: Cardiology

## 2020-02-19 ENCOUNTER — Encounter: Payer: Self-pay | Admitting: Cardiology

## 2020-02-19 VITALS — BP 148/85 | HR 45 | Resp 17 | Ht 71.0 in | Wt 207.0 lb

## 2020-02-19 DIAGNOSIS — I2510 Atherosclerotic heart disease of native coronary artery without angina pectoris: Secondary | ICD-10-CM | POA: Diagnosis not present

## 2020-02-19 DIAGNOSIS — I10 Essential (primary) hypertension: Secondary | ICD-10-CM

## 2020-02-19 DIAGNOSIS — I493 Ventricular premature depolarization: Secondary | ICD-10-CM

## 2020-02-19 DIAGNOSIS — E782 Mixed hyperlipidemia: Secondary | ICD-10-CM

## 2020-02-19 MED ORDER — NITROGLYCERIN 0.4 MG SL SUBL: 0.4000 mg | SUBLINGUAL_TABLET | SUBLINGUAL | 2 refills | Status: DC | PRN

## 2020-02-19 NOTE — Progress Notes (Signed)
Follow up visit  Subjective:   Shane Kerr, male    DOB: 03-02-1956, 65 y.o.   MRN: [id number]    Chief Complaint  Patient presents with  . Coronary Artery Disease  . Follow-up    6 month    HPI  64 y/o Caucasian male with CAD, NSTEMI 01/2019.  Patient was admitted to [facility] in 01/2019 with chest pain. Trop (old assay) was elevated at 1.07 ng/mL. He was diagnosed with NSTEMI. He underwent balloon angioplasty to diagonal, details below. He also has moderate disease in pLAD. Subsequently, patient underwent nuclear stress test that showed no ischemia/infarct.   He denies chest pain, shortness of breath, palpitations, leg edema, orthopnea, PND, TIA/syncope. Ever since being on beta blockers, he has felt sluggish and tired. BP much lower at home., He has completed 1 year DAPT since his NSTEMI.     Current Outpatient Medications on File Prior to Visit  Medication Sig Dispense Refill  . ASPIRIN LOW DOSE 81 MG EC tablet TAKE 1 TABLET BY MOUTH EVERY DAY 90 tablet 2  . atorvastatin (LIPITOR) 80 MG tablet TAKE 1 TABLET (80 MG TOTAL) BY MOUTH DAILY AT 6 PM. 90 tablet 2  . BRILINTA 90 MG TABS tablet TAKE 1 TABLET (90 MG TOTAL) BY MOUTH 2 (TWO) TIMES DAILY. 180 tablet 3  . carvedilol (COREG) 3.125 MG tablet TAKE 1 TABLET (3.125 MG TOTAL) BY MOUTH 2 (TWO) TIMES DAILY. 180 tablet 0  . nitroGLYCERIN (NITROSTAT) 0.4 MG SL tablet Place 1 tablet (0.4 mg total) under the tongue every 5 (five) minutes as needed for chest pain (Up to 3 doses in 15 minutes.). 30 tablet 2  . sertraline (ZOLOFT) 50 MG tablet Take 50 mg by mouth daily.     No current facility-administered medications on file prior to visit.    Cardiovascular studies:  EKG 02/19/2020: Sinus rhythm 54 bpm Normal EKG  Lexiscan Sestamibi Stress Test 02/09/2019: Stress EKG is non-diagnostic, as this is pharmacological stress test. Normal myocardial perfusion. Stress LV EF: 57%.  Low risk study.  EKG  01/14/2019: Sinus rhythm 72 bpm. Frequent PVC's Unchanged compared to previous EKG.  Telemetry: Frequent single PVC's   Coronary angiography 01/05/2019: LM: Normal LAD: Prox 50% focal stenosis. Mid 60-70% diffuse disease Culprit artery D2 with ostial 90% and lateral branch ostial 75% stenosis Successful balloon angioplasty with 20% residual stenosis. LCx: Normal RCA: RPDA 40% disease Normal LVEDP Normal LVEF  Recommendation: Aggressive medical management. DAPT for 1 year High intensity statin Lipitor 40 mg, metoprolol 25 mg bid. Diet and lifestyle modifications, cardiac rehab   Echocardiogram05/12/2018: 1. The left ventricle has normal systolic function, with an ejection fraction of 60-65%. The cavity size was normal. Left ventricular diastolic Doppler parameters are consistent with impaired relaxation. No evidence of left ventricular regional wall  motion abnormalities. 2. No significant valvular abnormalities.  Recent labs: 08/13/2019: Chol 92, TG 47, HDL 51, LDL 29  01/04/2019: Chol 138, TG 31, HDL 57, LDL 75   Review of Systems  Cardiovascular: Negative for chest pain, dyspnea on exertion, leg swelling, palpitations and syncope.         Vitals:   02/19/20 1120  BP: (!) 148/85  Pulse: (!) 45  Resp: 17  SpO2: 100%    Objective:   Physical Exam Vitals and nursing note reviewed.  Constitutional:      General: He is not in acute distress. Neck:     Vascular: No JVD.  Cardiovascular:  Rate and Rhythm: Normal rate and regular rhythm.     Pulses: Intact distal pulses.     Heart sounds: Normal heart sounds. No murmur heard.   Pulmonary:     Effort: Pulmonary effort is normal.     Breath sounds: Normal breath sounds. No wheezing or rales.           Assessment & Recommendations:   64 y/o Caucasian male with CAD, NSTEMI 01/2019.  CAD: Currently no angina symptoms. NSTEMI in 01/2019 with culprit vessel ostial diagonal  treated with balloon angioplasty. He has residual disease in nonculprit pLAD. Lexiscan nuclear stress test 02/2019 with no ischemia/infarction. Continue Aspirin, lipitor 80 mg daily Stop carvedilol due to side effects. Monitor BP at home and with PCP. If elevated, add amlodipine 5 mg daily  Frequent PVC's: Reportedly, this has been present for a long time. Normal EF. He asymptomatic from PVC's.  Counseled the patient to get vaccinated against COVID.  F/u in 6 months  Silus Lanzo Esther Hardy, MD [facility]. [place] Pager: [phone] Office: [phone] If no answer Cell [phone]

## 2020-02-28 ENCOUNTER — Other Ambulatory Visit: Payer: Self-pay | Admitting: Cardiology

## 2020-02-28 DIAGNOSIS — I493 Ventricular premature depolarization: Secondary | ICD-10-CM

## 2020-03-01 DIAGNOSIS — Z1211 Encounter for screening for malignant neoplasm of colon: Secondary | ICD-10-CM | POA: Diagnosis not present

## 2020-03-01 DIAGNOSIS — M545 Low back pain: Secondary | ICD-10-CM | POA: Diagnosis not present

## 2020-03-01 IMAGING — MR MR LUMBAR SPINE W/O CM
4 of 5 series · 27 of 48 positions shown · non-contrast
Comparison: 11/24/2017

CLINICAL DATA: Low back pain after lifting injuries in [DATE] and
[DATE]. Pain radiates down the right leg with weakness. Bilateral
foot tingling.

EXAM:
MRI LUMBAR SPINE WITHOUT CONTRAST
TECHNIQUE: Multiplanar, multisequence MR imaging of the lumbar spine was
performed. No intravenous contrast was administered.

[Series 2: T1 · sagittal · 4.0mm · 0.81mm/px · 7 of 18 slices shown (1 of 2)]
[im 1/18]
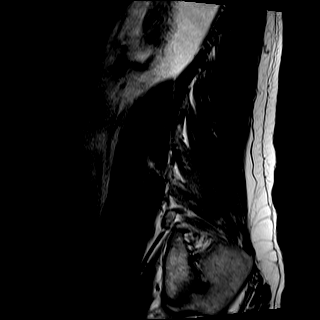
[im 3/18]
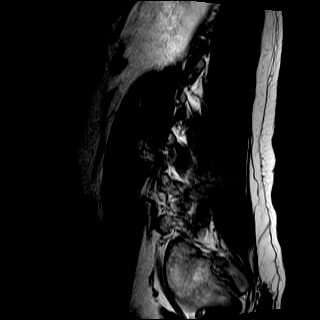
[im 6/18]
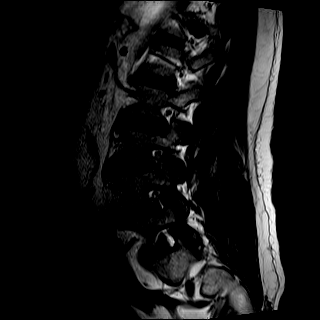
[im 9/18]
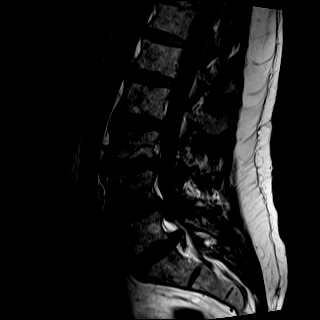
[im 12/18]
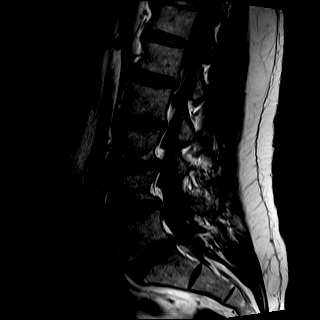
[im 15/18]
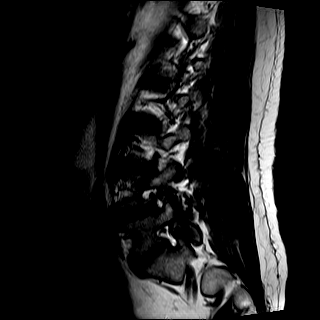
[im 18/18]
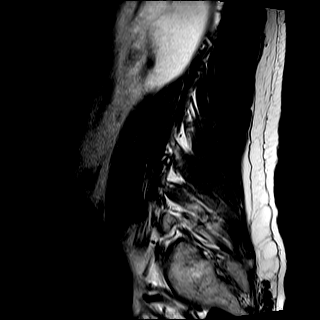

[Series 3: T2 · sagittal · 4.0mm · 0.81mm/px · 7 of 18 slices shown (1 of 2)]
[im 1/18]
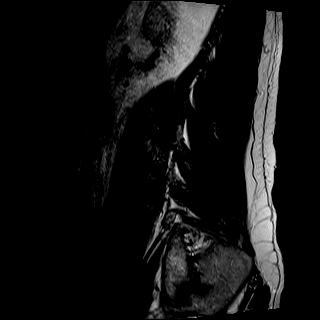
[im 3/18]
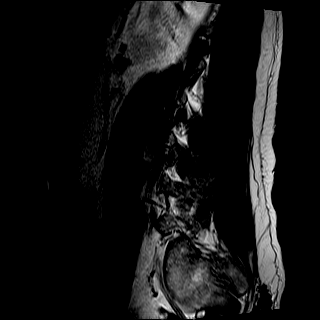
[im 6/18]
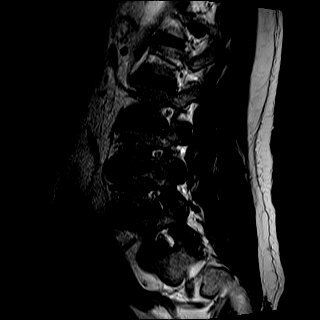
[im 9/18]
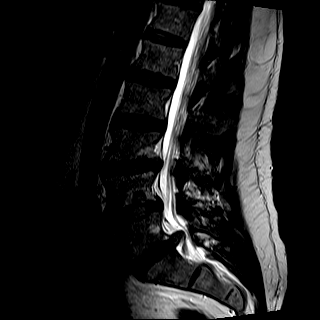
[im 12/18]
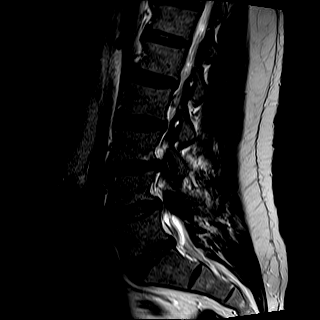
[im 15/18]
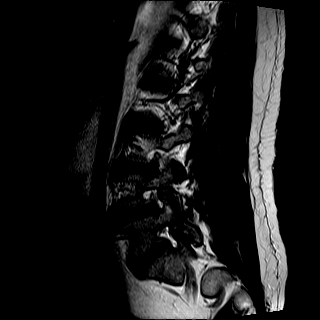
[im 18/18]
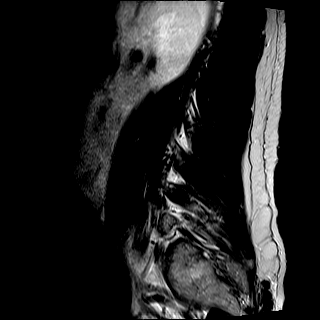

[Series 5: T2 · axial · 4.0mm · 0.39mm/px · z∈[-108,+85]mm · 8 of 40 slices shown (2 of 2)]
[im 1/40]
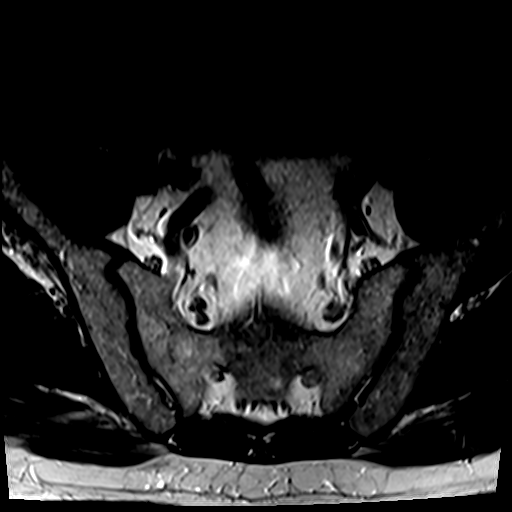
[im 7/40]
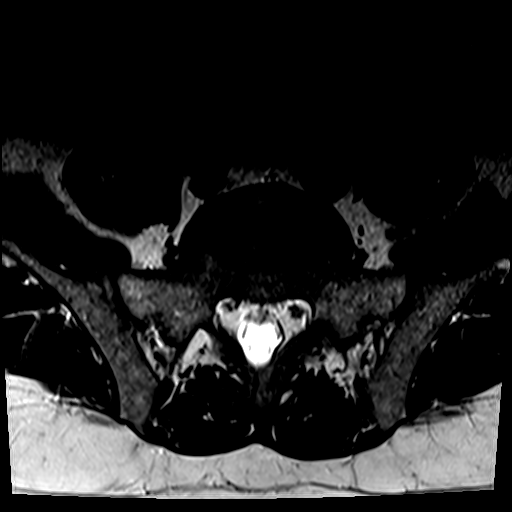
[im 13/40]
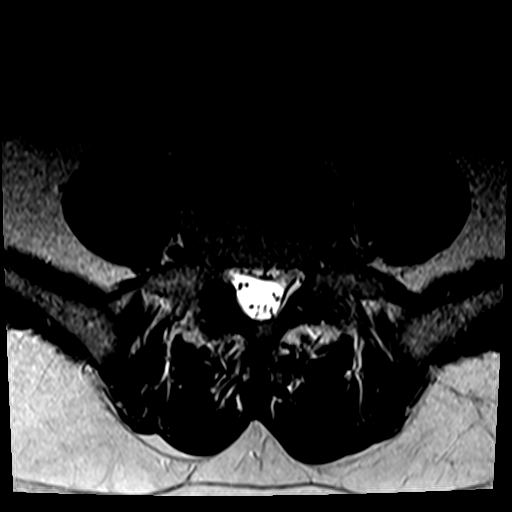
[im 19/40]
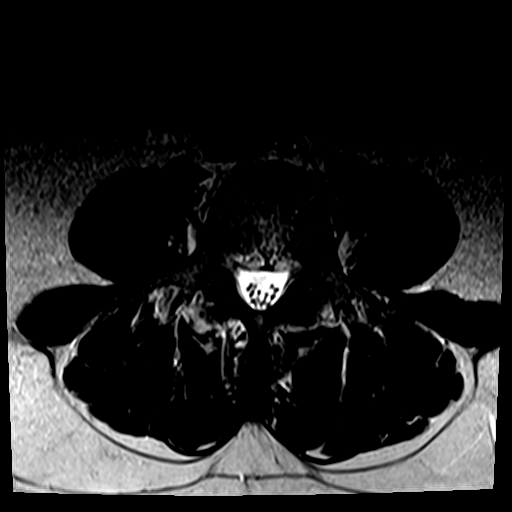
[im 22/40]
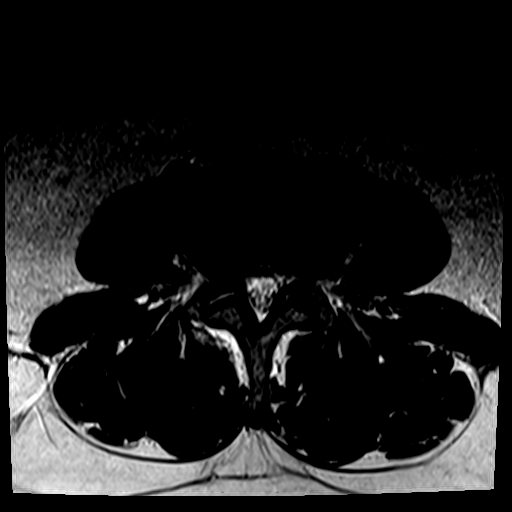
[im 28/40]
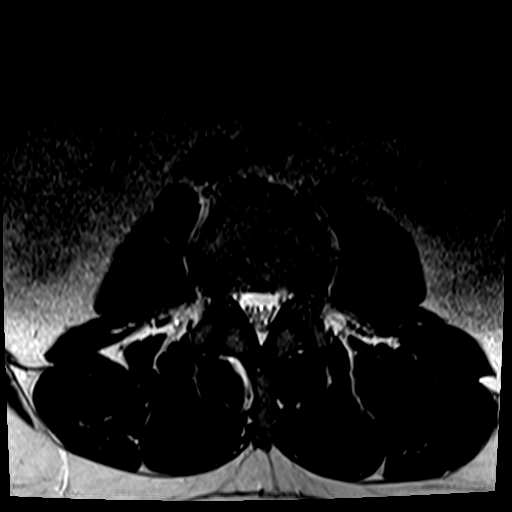
[im 34/40]
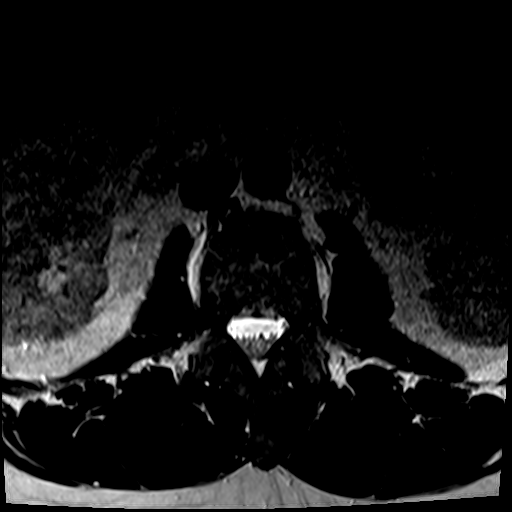
[im 40/40]
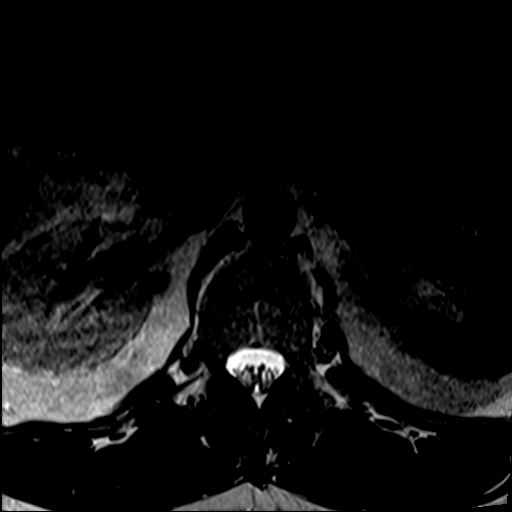

[Series 6: T1 · axial · 4.0mm · 0.39mm/px · z∈[-108,+55]mm · 5 of 40 slices shown (2 of 2)]
[im 1/40]
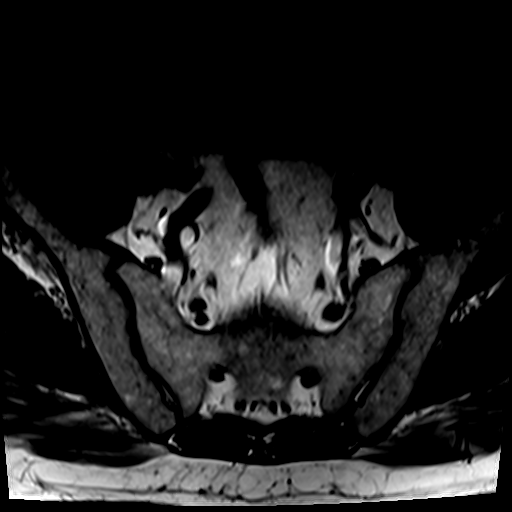
[im 7/40]
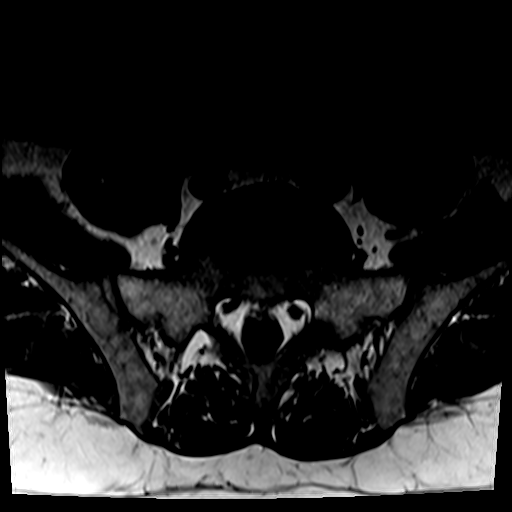
[im 13/40]
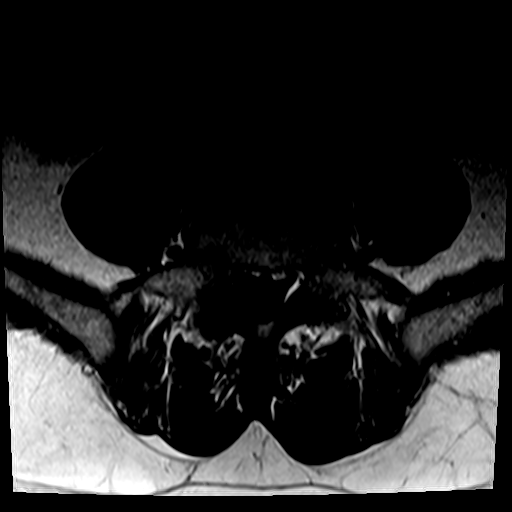
[im 22/40]
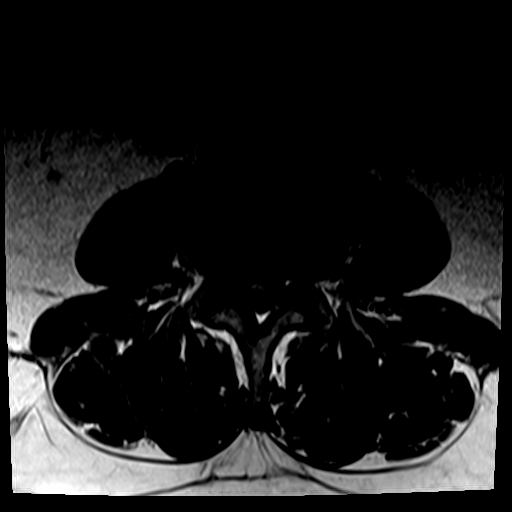
[im 34/40]
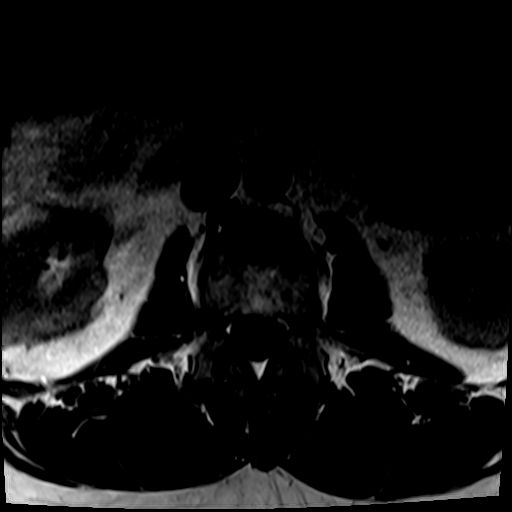

[27 of 48 positions shown; findings below may reference images not displayed]

FINDINGS: Segmentation:  Standard.

Alignment:  Normal.

Vertebrae: Similar appearance of L3 and L4 inferior endplate
Schmorl's nodes with mild adjacent edema. No fracture or suspicious
osseous lesion.

Conus medullaris and cauda equina: Conus extends to the L1 level.
Conus and cauda equina appear normal.

Paraspinal and other soft tissues: Unremarkable.

Disc levels:

Disc desiccation throughout the lumbar spine. Mild disc space
narrowing at L3-4 and L4-5.

L1-2: Negative.

L2-3: Left foraminal and extraforaminal disc protrusion results in
mild left neural foraminal stenosis without spinal stenosis,
unchanged.

L3-4: Circumferential disc bulging and mild facet hypertrophy result
in mild right greater than left lateral recess stenosis and
mild-to-moderate right greater than left neural foraminal stenosis,
unchanged. No significant generalized spinal stenosis.

L4-5: Mild circumferential disc bulging and moderate facet and
ligamentum flavum hypertrophy result in mild bilateral lateral
recess stenosis and mild right and mild-to-moderate left neural
foraminal stenosis without spinal stenosis, unchanged.

L5-S1: A left subarticular disc protrusion has mildly enlarged and
results in severe left lateral recess stenosis with left S1 nerve
root impingement. Mild right and moderate left facet hypertrophy
contribute to borderline to mild left neural foraminal stenosis,
unchanged.
IMPRESSION: 1. Mild enlargement of the left subarticular disc protrusion at
L5-S1 with left S1 nerve root impingement.
2. Unchanged disc and facet degeneration elsewhere as above.

## 2020-05-10 ENCOUNTER — Other Ambulatory Visit: Payer: Self-pay | Admitting: Cardiology

## 2020-05-10 DIAGNOSIS — I493 Ventricular premature depolarization: Secondary | ICD-10-CM

## 2020-05-26 ENCOUNTER — Other Ambulatory Visit: Payer: Self-pay

## 2020-05-26 MED ORDER — ATORVASTATIN CALCIUM 80 MG PO TABS: 80.0000 mg | ORAL_TABLET | Freq: Every day | ORAL | 1 refills | Status: DC

## 2020-05-31 ENCOUNTER — Other Ambulatory Visit: Payer: Self-pay

## 2020-05-31 MED ORDER — ATORVASTATIN CALCIUM 80 MG PO TABS: 80.0000 mg | ORAL_TABLET | Freq: Every day | ORAL | 1 refills | Status: DC

## 2020-07-05 DIAGNOSIS — Z20822 Contact with and (suspected) exposure to covid-19: Secondary | ICD-10-CM | POA: Diagnosis not present

## 2020-07-05 DIAGNOSIS — Z03818 Encounter for observation for suspected exposure to other biological agents ruled out: Secondary | ICD-10-CM | POA: Diagnosis not present

## 2020-07-12 DIAGNOSIS — Z03818 Encounter for observation for suspected exposure to other biological agents ruled out: Secondary | ICD-10-CM | POA: Diagnosis not present

## 2020-07-19 DIAGNOSIS — Z03818 Encounter for observation for suspected exposure to other biological agents ruled out: Secondary | ICD-10-CM | POA: Diagnosis not present

## 2020-07-19 DIAGNOSIS — Z20822 Contact with and (suspected) exposure to covid-19: Secondary | ICD-10-CM | POA: Diagnosis not present

## 2020-07-26 DIAGNOSIS — Z20822 Contact with and (suspected) exposure to covid-19: Secondary | ICD-10-CM | POA: Diagnosis not present

## 2020-07-26 DIAGNOSIS — Z03818 Encounter for observation for suspected exposure to other biological agents ruled out: Secondary | ICD-10-CM | POA: Diagnosis not present

## 2020-08-02 DIAGNOSIS — Z03818 Encounter for observation for suspected exposure to other biological agents ruled out: Secondary | ICD-10-CM | POA: Diagnosis not present

## 2020-08-02 DIAGNOSIS — Z20822 Contact with and (suspected) exposure to covid-19: Secondary | ICD-10-CM | POA: Diagnosis not present

## 2020-08-22 ENCOUNTER — Ambulatory Visit: Payer: 59 | Admitting: Cardiology

## 2020-08-25 ENCOUNTER — Ambulatory Visit: Payer: 59 | Admitting: Cardiology

## 2020-08-25 ENCOUNTER — Encounter: Payer: Self-pay | Admitting: Cardiology

## 2020-08-25 ENCOUNTER — Other Ambulatory Visit: Payer: Self-pay

## 2020-08-25 VITALS — BP 152/88 | HR 66 | Wt 224.0 lb

## 2020-08-25 DIAGNOSIS — I493 Ventricular premature depolarization: Secondary | ICD-10-CM

## 2020-08-25 DIAGNOSIS — I10 Essential (primary) hypertension: Secondary | ICD-10-CM

## 2020-08-25 DIAGNOSIS — I2510 Atherosclerotic heart disease of native coronary artery without angina pectoris: Secondary | ICD-10-CM

## 2020-08-25 NOTE — Progress Notes (Signed)
Follow up visit  Subjective:   Shane Kerr, male    DOB: 1956-07-16, 64 y.o.   MRN: [id number]    Chief Complaint  Patient presents with  . Coronary Artery Disease    HPI  64 y/o Caucasian male with CAD, NSTEMI 01/2019, hypertension.  Patient was admitted to [facility] in 01/2019 with chest pain. Trop (old assay) was elevated at 1.07 ng/mL. He was diagnosed with NSTEMI. He underwent balloon angioplasty to diagonal, details below. He also has moderate disease in pLAD. Subsequently, patient underwent nuclear stress test that showed no ischemia/infarct.   Patient has recently had lot of stressors related to his daughter's health. He admits to not having been physically active. Blood pressure is elevated today, but he does not want to be on any antihypertensives.    Current Outpatient Medications on File Prior to Visit  Medication Sig Dispense Refill  . ASPIRIN LOW DOSE 81 MG EC tablet TAKE 1 TABLET BY MOUTH EVERY DAY 90 tablet 2  . atorvastatin (LIPITOR) 80 MG tablet Take 1 tablet (80 mg total) by mouth daily at 6 PM. 90 tablet 1  . nitroGLYCERIN (NITROSTAT) 0.4 MG SL tablet Place 1 tablet (0.4 mg total) under the tongue every 5 (five) minutes as needed for chest pain (Up to 3 doses in 15 minutes.). 30 tablet 2  . sertraline (ZOLOFT) 50 MG tablet Take 50 mg by mouth daily.     No current facility-administered medications on file prior to visit.    Cardiovascular studies:  EKG 08/25/2020: Sinus rhythm 72 bpm Frequent PVC's  Lexiscan Sestamibi Stress Test 02/09/2019: Stress EKG is non-diagnostic, as this is pharmacological stress test. Normal myocardial perfusion. Stress LV EF: 57%.  Low risk study.  EKG 01/14/2019: Sinus rhythm 72 bpm. Frequent PVC's Unchanged compared to previous EKG.  Telemetry: Frequent single PVC's   Coronary angiography 01/05/2019: LM: Normal LAD: Prox 50% focal stenosis. Mid 60-70% diffuse disease Culprit artery D2 with  ostial 90% and lateral branch ostial 75% stenosis Successful balloon angioplasty with 20% residual stenosis. LCx: Normal RCA: RPDA 40% disease Normal LVEDP Normal LVEF  Recommendation: Aggressive medical management. DAPT for 1 year High intensity statin Lipitor 40 mg, metoprolol 25 mg bid. Diet and lifestyle modifications, cardiac rehab   Echocardiogram05/12/2018: 1. The left ventricle has normal systolic function, with an ejection fraction of 60-65%. The cavity size was normal. Left ventricular diastolic Doppler parameters are consistent with impaired relaxation. No evidence of left ventricular regional wall  motion abnormalities. 2. No significant valvular abnormalities.  Recent labs: 08/13/2019: Chol 92, TG 47, HDL 51, LDL 29  01/04/2019: Chol 138, TG 31, HDL 57, LDL 75   Review of Systems  Cardiovascular: Negative for chest pain, dyspnea on exertion, leg swelling, palpitations and syncope.         Vitals:   08/25/20 0920  BP: (!) 152/88  Pulse: 66  SpO2: 99%    Objective:   Physical Exam Vitals and nursing note reviewed.  Constitutional:      General: He is not in acute distress. Neck:     Vascular: No JVD.  Cardiovascular:     Rate and Rhythm: Normal rate and regular rhythm.     Pulses: Intact distal pulses.     Heart sounds: Normal heart sounds. No murmur heard.   Pulmonary:     Effort: Pulmonary effort is normal.     Breath sounds: Normal breath sounds. No wheezing or rales.  Assessment & Recommendations:   64 y/o Caucasian male with CAD, NSTEMI 01/2019, hypertension.  CAD: Currently no angina symptoms. NSTEMI in 01/2019 with culprit vessel ostial diagonal treated with balloon angioplasty. He has residual disease in nonculprit pLAD. Lexiscan nuclear stress test 02/2019 with no ischemia/infarction. Continue Aspirin, lipitor 80 mg daily Does not want to be on any antihypertensives right now. Recommend f/u w/PCP. If BP  remains elevated, could add metoprolol sucinate or amlodipine.   Frequent PVC's: Reportedly, this has been present for a long time. Normal EF. He asymptomatic from PVC's.  F/u in 6 months  Jabree Rebert Emiliano Dyer, MD [facility] Cardiovascular. [place] Pager: [phone] Office: [phone] If no answer Cell [phone]

## 2020-09-01 DIAGNOSIS — Z1159 Encounter for screening for other viral diseases: Secondary | ICD-10-CM | POA: Diagnosis not present

## 2020-10-25 IMAGING — DX PORTABLE CHEST - 1 VIEW
1 series · 1 of 1 positions shown · non-contrast
Comparison: None.

CLINICAL DATA: Chest pain

EXAM:
PORTABLE CHEST 1 VIEW

[chest ap]
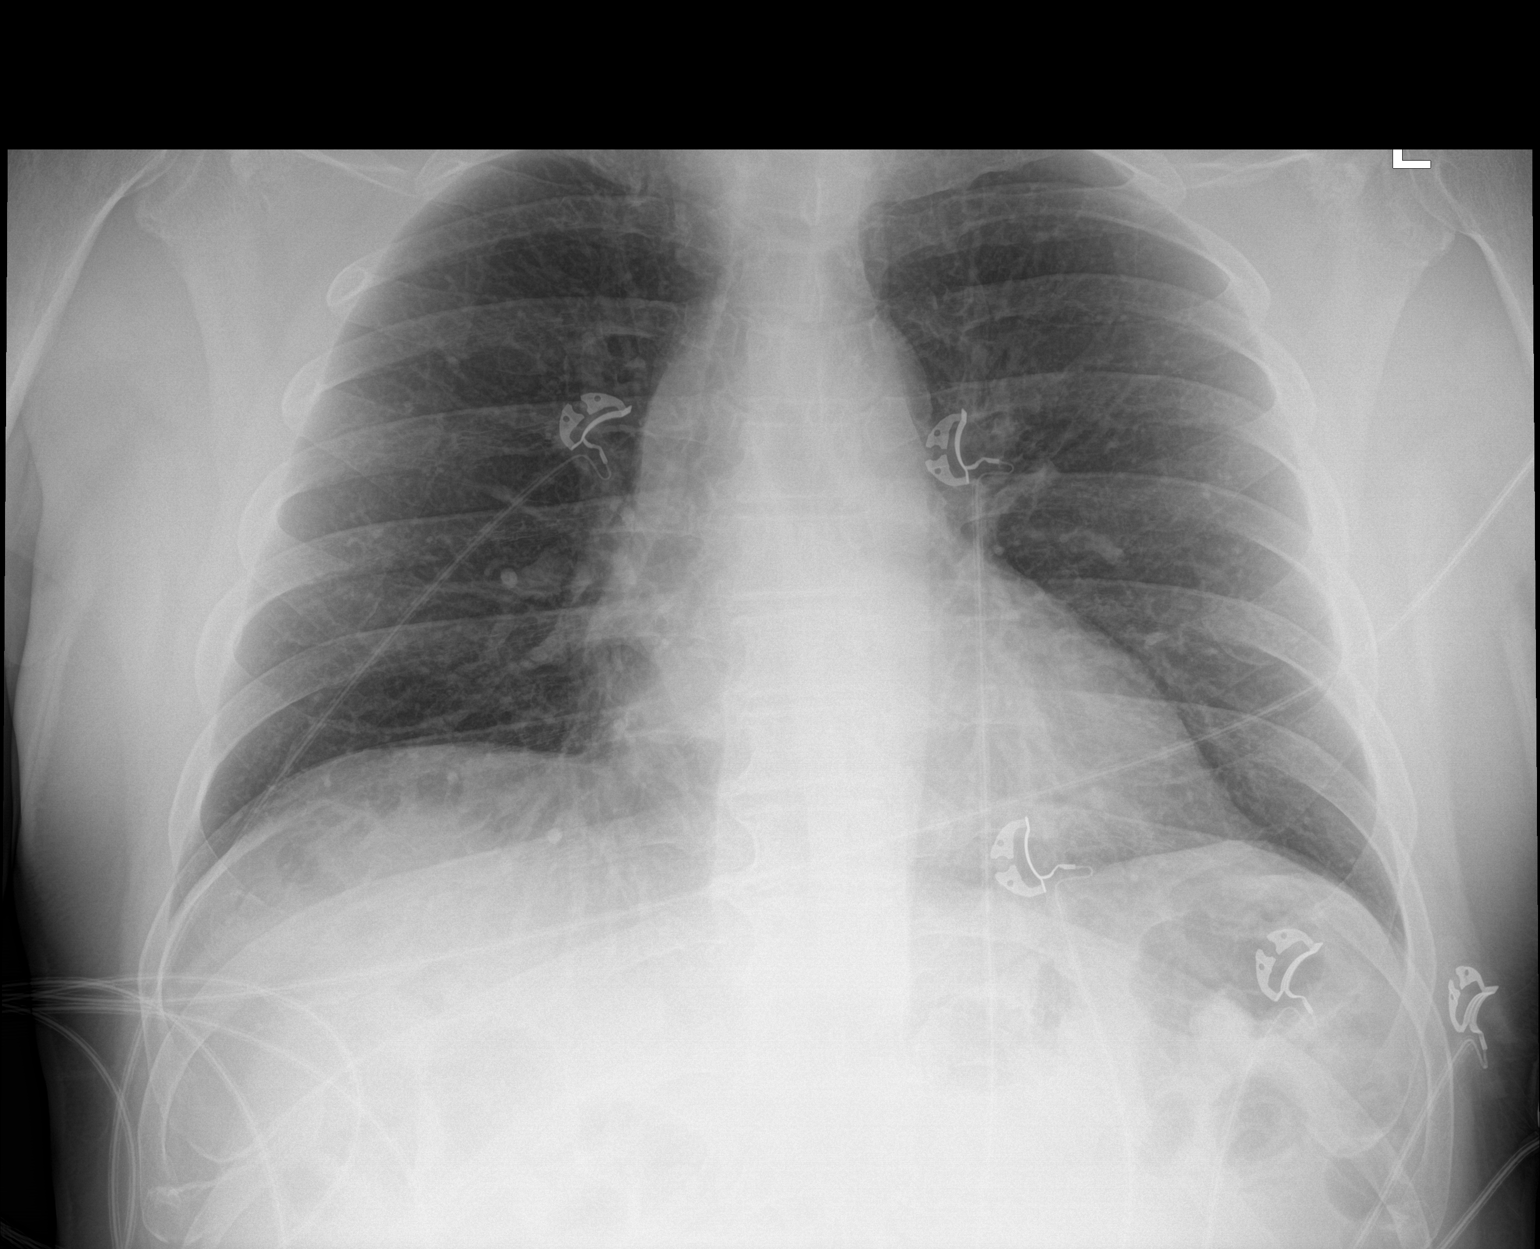

[1 of 1 positions shown; findings below may reference images not displayed]

FINDINGS: Normal heart size and mediastinal contours. No acute infiltrate or
edema. No effusion or pneumothorax. No acute osseous findings.
IMPRESSION: Negative chest.

## 2020-11-04 ENCOUNTER — Other Ambulatory Visit: Payer: Self-pay | Admitting: Cardiology

## 2020-11-20 DIAGNOSIS — L239 Allergic contact dermatitis, unspecified cause: Secondary | ICD-10-CM | POA: Diagnosis not present

## 2020-12-09 ENCOUNTER — Other Ambulatory Visit: Payer: Self-pay | Admitting: *Deleted

## 2020-12-09 ENCOUNTER — Encounter: Payer: Self-pay | Admitting: *Deleted

## 2020-12-09 DIAGNOSIS — F339 Major depressive disorder, recurrent, unspecified: Secondary | ICD-10-CM | POA: Insufficient documentation

## 2020-12-09 DIAGNOSIS — M5430 Sciatica, unspecified side: Secondary | ICD-10-CM | POA: Insufficient documentation

## 2020-12-09 NOTE — Patient Outreach (Addendum)
[facility]) Care Management  12/09/2020  KEVONTAY BURKS 28-Nov-1955 [id number]   [employer] Telephone Assessment/Screen for [facility] referral  Referral Date: 11/25/20 Referral Source: [employer]  Referral Reason: assess for need of [facility] complex care services  Insurance: [name]   Outreach attempt # 1 successful [phone]  Patient is able to verify HIPAA, DOB and address Reviewed and addressed referral to [facility] with patient Discussed [employer] Hypertension (HTN) initiative plan  Consent: [facility] RN CM reviewed [facility] services with patient. Patient gave verbal consent for services [facility] telephonic RN CM.  Pt states he does not feel he has Hypertension (HTN) He reports his wife is a Engineer, civil (consulting) He reports a recent urgent care visit for Poison ivy, recalling an elevation of BP of less than 140/90  [facility] RN CM reviewed with Mr Loera  BP value for 08/25/20 of 152/88 and 02/29/20 148/85  Both these Values were at cardiology office of Dr Rosemary Holms  He confirms these values  He is not on Hypertension (HTN) medicines presently but was on some for a year previously Discussed with him pathophysiology of HTN and provided encouragement that HTN can be managed with diet and activity He voiced understanding and appreciation  He agreed to follow within 30 business days and education information to assist  He was provided [facility] RN CM office demographics    Patient goal interventions To get his wife to check his BP values at home   [facility] RN CM disease management/education   Sent EMMI information sent via e-mail - Video, hypertension, low salt info and article on controlling your blood pressure through lifestyle  [facility] CMA assist requested in sending patient a BP monitor via mail   Past Medical History:  Diagnosis Date  . Coronary artery disease   . Depression   . HTN (hypertension) 01/14/2019  . Hyperlipemia 01/14/2019  . NSTEMI (non-ST elevated myocardial infarction) [facility])    Patient  Active Problem List   Diagnosis Date Noted  . Sciatica 12/09/2020  . Recurrent major depression (HCC) 12/09/2020  . Tinnitus 03/31/2019  . Sensorineural hearing loss (SNHL), bilateral 03/31/2019  . Essential hypertension 01/14/2019  . Hyperlipemia 01/14/2019  . Frequent PVCs 01/14/2019  . Coronary artery disease involving native coronary artery of native heart without angina pectoris 01/14/2019  . Dizziness 01/14/2019  . Prolapsed lumbar disc 06/02/2018  . Lumbar radiculopathy 06/02/2018     DME: BP cuff   Current Outpatient Medications on File Prior to Visit  Medication Sig Dispense Refill  . predniSONE (DELTASONE) 20 MG tablet 2 tablets    . triamcinolone ointment (KENALOG) 0.1 % 1 application    . ASPIRIN LOW DOSE 81 MG EC tablet TAKE 1 TABLET BY MOUTH EVERY DAY 90 tablet 2  . atorvastatin (LIPITOR) 80 MG tablet TAKE 1 TABLET (80 MG TOTAL) BY MOUTH DAILY AT 6 PM. 90 tablet 1  . [employer] GENTLE LAXATIVE 5 MG EC tablet See admin instructions.    . nitroGLYCERIN (NITROSTAT) 0.4 MG SL tablet Place 1 tablet (0.4 mg total) under the tongue every 5 (five) minutes as needed for chest pain (Up to 3 doses in 15 minutes.). 30 tablet 2  . polyethylene glycol-electrolytes (NULYTELY) 420 g solution See admin instructions.    . predniSONE (DELTASONE) 20 MG tablet Take 40 mg by mouth daily.    . sertraline (ZOLOFT) 100 MG tablet Take 1 tablet by mouth daily.    . sertraline (ZOLOFT) 50 MG tablet Take 50 mg by mouth daily.    Marland Kitchen  triamcinolone ointment (KENALOG) 0.1 % Apply topically 2 (two) times daily.     No current facility-administered medications on file prior to visit.      Plan: Patient agrees to the care plan and follow up within the next 30 business days Pt encouraged to return a call to [facility] RN CM prn Goals Addressed              This Visit's Progress     Patient Stated   .  [facility]) Track and Manage My Blood Pressure-Hypertension (pt-stated)   On track     Timeframe:  Short-Term  Goal Priority:  High Start Date:            12/09/20                 Expected End Date:        01/31/21               Follow Up Date 01/06/21   - check blood pressure weekly - choose a place to take my blood pressure (home, clinic or office, retail store) - write blood pressure results in a log or diary  Have wife (nurse) check Blood pressure     Notes:  12/09/20 Pt will to participate in program and start monitoring BP        Sharyl Panchal L. Noelle Penner, RN, BSN, CCM [facility] Telephonic Care Management Care Coordinator Office number [phone] Mobile number [phone]  Main [facility] number [phone] Fax number [phone]

## 2021-01-04 DIAGNOSIS — M79644 Pain in right finger(s): Secondary | ICD-10-CM | POA: Insufficient documentation

## 2021-01-04 DIAGNOSIS — M65341 Trigger finger, right ring finger: Secondary | ICD-10-CM | POA: Insufficient documentation

## 2021-01-06 ENCOUNTER — Encounter: Payer: Self-pay | Admitting: *Deleted

## 2021-01-06 ENCOUNTER — Other Ambulatory Visit: Payer: Self-pay | Admitting: *Deleted

## 2021-01-06 NOTE — Patient Outreach (Addendum)
[facility]) Care Management  01/06/2021  Shane Kerr 08/13/1956 [id number]   THN Unsuccessful outreachfor Aetna Initiative referral  Referral Date: 11/25/20 Referral Source: Aetna  Referral Reason: assess for need of [facility] complex care services  Insurance: Monia Pouch medicare  Admissions 0 Last successful outreach on 12/09/20 with EMMI education sent   Outreach attempt to the home number  No answer. THN RN CM left HIPAA [facility] Portability and Accountability Act) compliant voicemail message along with CM's contact info.   Plan: [facility] RN CM scheduled this patient for another call attempt within 4-7 business days  [name] L. Noelle Penner, RN, BSN, CCM [facility] Telephonic Care Management Care Coordinator Office number [phone] Mobile number [phone]  Main [facility] number [phone] Fax number [phone]

## 2021-01-17 ENCOUNTER — Other Ambulatory Visit: Payer: Self-pay | Admitting: *Deleted

## 2021-01-17 NOTE — Patient Outreach (Signed)
[facility]) Care Management  01/17/2021  Shane Kerr 10/18/1955 [id number]   THN secound Unsuccessful outreach forAetna Initiativereferral  Referral Date:11/25/20 Referral Source:Aetna Referral Reason:assess for need of [facility] complex care services Insurance:Aetna medicare  Admissions 0 Last successful outreach on 12/09/20 with EMMI education sent  Outreach attempts unsuccessful on 01/06/21 and 01/17/21  Outreach attempt to the home number  No answer. THN RN CM left HIPAA [facility] Portability and Accountability Act) compliant voicemail message along with CM's contact info.   Plan: [facility] unsuccessful outreach letter sent [facility] RN CM scheduled this patient for another call attempt within 4-7 business days  Kesa Birky L. Noelle Penner, RN, BSN, CCM [facility] Telephonic Care Management Care Coordinator Office number [phone] Mobile number [phone]  Main [facility] number [phone] Fax number [phone]

## 2021-01-24 ENCOUNTER — Other Ambulatory Visit: Payer: Self-pay | Admitting: *Deleted

## 2021-01-24 ENCOUNTER — Other Ambulatory Visit: Payer: Self-pay

## 2021-01-24 NOTE — Patient Outreach (Signed)
[facility]) Care Management  01/24/2021  Shane Kerr 07-01-1956 [id number]  [facility] Telephone Assessment/Screen for [facility] referral  Referral Date: 11/25/20 Referral Source: Aetna  Referral Reason: assess for need of [facility] complex care services  Insurance: Aetna medicare    Patient is able to verify HIPAA [facility] Portability and Accountability Act) identifiers Reviewed and addressed the purpose of the follow up call with the patient  Consent: [facility] RN CM reviewed [facility] services with patient. Patient gave verbal consent for services.   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  Shane Kerr reports he and his home is in a disarray without his wife, Shane Kerr's guidance  Shane Kerr is a Shane Kerr nurse  She was assisting in monitoring his blood pressure (BP) He reports he needs to get a notebook to document the readings He is unable to provide a BP value today  He is not on HTN medicine and would like to prevent having to be given a prescription Trying to manage with food and stress management He takes Zoloft and aspirin  Sleeping concerns Reports not sleeping well  He generally tries to go to sleep at 2130 and get up at 0500 but has not been successful lately  He prefers to take off mid week prn  [facility] RN CM reviewed sleep interventions that may assist with sleep    depression  Continues as he reports feeling isolated since wife in [place] For stress  relief he likes to work with his tools at home    Plans Patient agrees to care plan and follow up monthly Follow up in the next 30-45 business days agreed upon Pt encouraged to return a call to [facility] RN CM prn   Goals Addressed              This Visit's Progress     Patient Stated   .  [facility]) Manage My Emotions (pt-stated)   On track     Timeframe:  Short-Term Goal Priority:  Medium Start Date:                  01/24/21           Expected End Date:     03/02/21                  Follow Up Date 02/22/21    - talk about feelings with a friend, family or spiritual advisor  -work with tools at home      Notes:  01/24/21 concern about his daughter and in laws. Has not started monitoring and documenting his BP values    .  (THN) Track and Manage My Blood Pressure-Hypertension (pt-stated)   Not on track     Timeframe:  Short-Term Goal Priority:  High Start Date:            12/09/20  Expected End Date:        03/02/21               Follow Up Date 02/22/21   - check blood pressure weekly - choose a place to take my blood pressure (home, clinic or office, retail store) - write blood pressure results in a log or diary  Have wife (nurse) check Blood pressure   Notes:   01/24/21 concern about his daughter and in laws. Has not started monitoring and documenting his BP values but has received BP cuff mailed to him by [facility]  01/06/21 unsuccessful outreach  12/09/20 Pt will to participate in program and start monitoring BP        Edker Punt L. Noelle Penner, RN, BSN, CCM [facility] Telephonic Care Management Care Coordinator Office number [phone] Main [facility] number [phone] Fax number [phone]

## 2021-02-22 ENCOUNTER — Other Ambulatory Visit: Payer: Self-pay | Admitting: *Deleted

## 2021-02-22 ENCOUNTER — Other Ambulatory Visit: Payer: Self-pay

## 2021-02-22 NOTE — Patient Outreach (Addendum)
[facility]) Care Management  02/22/2021  CORIE VAVRA 1956-01-16 [id number]   [facility] Unsuccessful outreach  Mr ANTWIAN SANTAANA was referred to [facility] on 11/25/20 by [facility] insurance  Referral Reason: assess for need of [facility] complex care services  Insurance: [name]  Admissions (cone) in last 6 months 0  Last successful outreach on 01/24/21   Outreach attempt to the preferred listed number [phone] No answer. [facility] RN CM left HIPAA [facility] Portability and Accountability Act) compliant voicemail message along with CM's contact info.   Plan: [facility] RN CM scheduled this patient for another call attempt within 7-10 business days  Casady Voshell L. Noelle Penner, RN, BSN, CCM [facility] Telephonic Care Management Care Coordinator Office number [phone] Mobile number [phone]  Main [facility] number [phone] Fax number [phone]

## 2021-02-22 NOTE — Patient Outreach (Signed)
[facility]) Care Management  02/22/2021  Shane Kerr Jan 09, 1956 [id number]   [facility] successful outreach to complex care patient   Shane Kerr was referred to [facility] on 11/25/20 by [employer]  Referral Reason: assess for need of [facility] complex care services  Insurance: Aetna medicare   Admissions (cone) in last 6 months 0    Shane Kerr left a return voice message for [facility] RN CM sharing his weekly BP values As 125/76 68 on 01/26/21, 138/73 78 on 02/09/21 and 128/70 78 on 02/21/21 He reports he was active with each of these checks  [facility] RN CM returned a call to Shane Kerr   Patient is able to verify HIPAA [facility] Portability and Accountability Act) identifiers Reviewed and addressed the purpose of the follow up call with the patient  Consent: [facility] RN CM reviewed [facility] services with patient. Patient gave verbal consent for services.     Further assessment Shane Kerr reports overall he is doing better He confirms his various social concerns are improving His in laws continue to have some concerns with care and recurrent urinary tract infection (UTI) (father in law)  His daughter concerns have improved after correctly diagnosed (epilepsy episodes with sleep leading to personality changes) offered [facility] SW services He confirms they are participating with a 16 week support program at a local church [facility]) Shane Kerr is also "relying on my faith" A mutual rapport established   Hypertension (HTN) [facility] RN CM discussed silent hypertension that can cause damage to his blood vessels leading to heart or kidney problem, strokes. Encouraged monitoring to help control his BP and prevent need of medicine He agrees to continue to check his BP once a week  He has not started review of EMMI materials e-mailed to him to view by 01/31/21  Progression of THN services discussed - Shane Kerr agrees to care plan and follow up in the next 45-60 business  days  Plans Follow up with patient as agreed Shane encouraged to return a call to [facility] RN CM prn  Goals Addressed               This Visit's Progress     Patient Stated     [facility]) Manage My Emotions (Shane-stated)   On track     Timeframe:  Short-Term Goal Priority:  Medium Start Date:                  01/24/21           Expected End Date:     03/02/21                  Follow Up Date 02/22/21    - talk about feelings with a friend, family or spiritual advisor  -work with tools at home    Notes:  01/24/21 concern about his daughter and in laws. Has not started monitoring and documenting his BP values       Shane Kerr) Track and Manage My Blood Pressure-Hypertension (Shane-stated)   On track     Timeframe:  Short-Term Goal Priority:  High Start Date:            12/09/20                 Expected End Date:        05/03/21               Follow Up Date 04/26/21   -  check blood pressure weekly - choose a place to take my blood pressure (home, clinic or office, retail store) - write blood pressure results in a log or diary  Have wife (nurse) check Blood pressure prn    Notes:   02/22/21 weekly BP values -As 125/76 68 on 01/26/21, 138/73 78 on 02/09/21 and 128/70 78 on 02/21/21 He reports he was active with each of these checks Resolving stressors with participation in structured counseling program 01/24/21 concern about his daughter and in laws. Has not started monitoring and documenting his BP values but has received BP cuff mailed to him by [facility]  01/06/21 unsuccessful outreach  12/09/20 Shane will to participate in program and start monitoring BP          Shane Kerr L. Shane Penner, RN, BSN, CCM [facility] Telephonic Care Management Care Coordinator Office number [phone] Main [facility] number [phone] Fax number [phone]

## 2021-04-26 ENCOUNTER — Other Ambulatory Visit: Payer: Self-pay | Admitting: *Deleted

## 2021-04-26 NOTE — Patient Outreach (Signed)
[facility]) Care Management  04/26/2021  MAKHI MUZQUIZ 1956-06-18 [id number]   [facility] Unsuccessful outreach  Mr TADD HOLTMEYER was referred to [facility] on 11/25/20 by [facility] insurance  Referral Reason: assess for need of [facility] complex care services  Insurance: [name]   Admissions (cone) in last 6 months 0  Last successful outreach on 02/22/21   Outreach attempt to the home number  No answer. TH[facility] RN CM left HIPAA [facility] Portability and Accountability Act) compliant voicemail message along with CM's contact info.   Plan: [facility] RN CM scheduled this patient for another call attempt within 4-7 business days Unsuccessful outreach on 04/26/21   Kamyah Wilhelmsen L. Noelle Penner, RN, BSN, CCM [facility] Telephonic Care Management Care Coordinator Office number [phone] Mobile number [phone]  Main [facility]N number [phone] Fax number [phone]

## 2021-05-06 DIAGNOSIS — Z0100 Encounter for examination of eyes and vision without abnormal findings: Secondary | ICD-10-CM | POA: Diagnosis not present

## 2021-05-10 ENCOUNTER — Other Ambulatory Visit: Payer: Self-pay | Admitting: Cardiology

## 2021-05-12 ENCOUNTER — Other Ambulatory Visit: Payer: Self-pay

## 2021-05-12 ENCOUNTER — Other Ambulatory Visit: Payer: Self-pay | Admitting: *Deleted

## 2021-05-12 ENCOUNTER — Encounter: Payer: Self-pay | Admitting: *Deleted

## 2021-05-12 NOTE — Patient Outreach (Signed)
[facility] Care Management  05/12/2021  Shane Kerr [birth year] [id number]  [facility] outreach to complex care patient Shane Shane Kerr was referred to [facility] on 11/25/20 by [facility] insurance  Referral Reason: assess for need of [facility] complex care services  Insurance: Aetna medicare   Admissions (cone) in last 6 months 0   Shane Kerr is able to verify HIPAA identifiers  Assessment Shane Kerr reports he is doing well He and his wife who is reported as a retired Materials engineer check his blood pressure when the wife is available  He reports his BP is remaining stable  Allowed him time to ventilate his feelings about silent or quiet quitting No coping well with daughter medical conditions and care of in laws He denies symptoms of depression  Has not reviewed any EMMIs sent to him via his confirmed e-mail address listed in EPIC  Reviewed THN progression and offered a referral to [facility] health coach/disease management but his preference is for another outreach in the next 60 business days Patient Active Problem List   Diagnosis Date Noted   Acquired trigger finger of right ring finger 01/04/2021   Pain in finger of right hand 01/04/2021   Sciatica 12/09/2020   Recurrent major depression (HCC) 12/09/2020   Tinnitus 03/31/2019   Sensorineural hearing loss (SNHL), bilateral 03/31/2019   Essential hypertension 01/14/2019   Hyperlipemia 01/14/2019   Frequent PVCs 01/14/2019   Coronary artery disease involving native coronary artery of native heart without angina pectoris 01/14/2019   Dizziness 01/14/2019   Prolapsed lumbar disc 06/02/2018   Lumbar radiculopathy 06/02/2018    Plan Patient agrees to care plan and follow up within the next 60 business days  Goals Addressed               This Visit's Progress     Patient Stated     [facility]) Manage My Emotions (pt-stated)   On track     Timeframe:  Short-Term Goal Priority:  Medium Start Date:                  01/24/21            Expected End Date:     03/02/21                  Closed 05/12/21 Barriers: Knowledge     - talk about feelings with a friend, family or spiritual advisor  -work with tools at home   Notes:  05/12/21 denies any coping concerns further -goal completed  04/26/21 unsuccessful outreach 02/22/21 doing better continue with medical concerns of his family members but his wife cares for them  01/24/21 concern about his daughter and in laws. Has not started monitoring and documenting his BP values      (THN) Track and Manage My Blood Pressure-Hypertension (pt-stated)   On track     Timeframe:  Long-Range Goal Priority:  High Start Date:            12/09/20                 Expected End Date:        11/31/22               Follow Up Date 07/07/21 Barriers: Knowledge    - check blood pressure weekly - choose a place to take my blood pressure (home, clinic or office, retail store) - write blood pressure results in a log or diary  Have wife (  nurse) check Blood pressure prn   Notes:   05/12/21 reports BP fine as wife checks it Unable to offer values today.  04/26/21 unsuccessful outreach 02/22/21 weekly BP values -As 125/76 68 on 01/26/21, 138/73 78 on 02/09/21 and 128/70 78 on 02/21/21 He reports he was active with each of these checks Resolving stressors with participation in structured counseling program 01/24/21 concern about his daughter and in laws. Has not started monitoring and documenting his BP values but has received BP cuff mailed to him by [facility]  01/06/21 unsuccessful outreach  12/09/20 Pt will to participate in program and start monitoring BP         Lillian Ballester L. Noelle Penner, RN, BSN, CCM [facility] Telephonic Care Management Care Coordinator Office number [phone] Main [facility] number [phone] Fax number [phone]

## 2021-07-07 ENCOUNTER — Other Ambulatory Visit: Payer: Self-pay | Admitting: *Deleted

## 2021-07-07 NOTE — Patient Outreach (Signed)
[facility]) Care Management  07/07/2021  URI TURNBOUGH 12/20/1955 [id number]   [facility] Unsuccessful outreach Shane Kerr was [facility] on 11/25/20 by [facility] insurance  Referral Reason: assess for need of [facility] complex care services  Insurance: [name]   Admissions (cone) in last 6 months 0  Last successful outreach 05/12/21 Outreach attempt to the listed at the preferred outreach number in [facility]  No answer. THN RN CM left HIPAA [facility] Portability and Accountability Act) compliant voicemail message along with CM's contact info.   Plan: [facility] RN CM scheduled this patient for another call attempt within 4-7 business days  Unsuccessful outreach on 07/07/21   [name] L. Noelle Penner, RN, BSN, CCM [facility] Telephonic Care Management Care Coordinator Office number [phone] Mobile number [phone]  Main [facility] number [phone] Fax number [phone]

## 2021-07-18 ENCOUNTER — Other Ambulatory Visit: Payer: Self-pay | Admitting: *Deleted

## 2021-07-18 NOTE — Patient Outreach (Signed)
[facility]) Care Management  07/18/2021  LEILAN BOCHENEK [birth year] [id number]   THN second Unsuccessful outreach Mr JAKHAI FANT was referred to [facility] on 11/25/20 by [facility] insurance  Referral Reason: assess for need of [facility] complex care services  Insurance: [name]   Admissions (cone) in last 6 months 0   Last successful outreach 05/12/21  Outreach attempt to the listed at the preferred outreach number in [facility]  No answer. THN RN CM left HIPAA [facility] Portability and Accountability Act) compliant voicemail message along with CM's contact info.   Plan: [facility] RN CM scheduled this patient for another call attempt within 4-7 business days Unsuccessful outreach letter sent on 07/18/21 Unsuccessful outreach on 07/07/21, 07/18/21    Cala Bradford L. Noelle Penner, RN, BSN, CCM [facility] Telephonic Care Management Care Coordinator Office number [phone] Mobile number [phone]  Main [facility] number [phone] Fax number [phone]

## 2021-08-01 ENCOUNTER — Other Ambulatory Visit: Payer: Self-pay | Admitting: *Deleted

## 2021-08-01 NOTE — Patient Outreach (Signed)
[facility]) Care Management  08/01/2021  Shane Kerr 05/16/1956 [id number]   THN third Unsuccessful outreach Shane Kerr was referred to [facility] on 11/25/20 by [employer]  Referral Reason: assess for need of [facility] complex care services  Insurance: [name]   Admissions (cone) in last 6 months 0   Last successful outreach 05/12/21   Outreach attempt to the listed at the preferred outreach number in [facility]  No answer. THN RN CM left HIPAA [facility] Portability and Accountability Act) compliant voicemail message along with CM's contact info.    Plan: [facility] RN CM scheduled this patient for pending case closure Unsuccessful outreach letter sent on 07/18/21 Unsuccessful outreach on 07/07/21, 07/18/21 08/01/21     [name] Noelle Penner, RN, BSN, CCM [facility] Telephonic Care Management Care Coordinator Office number [phone] Mobile number [phone]  Main THN number [phone] Fax number [phone]

## 2021-08-04 ENCOUNTER — Other Ambulatory Visit: Payer: Self-pay

## 2021-08-04 DIAGNOSIS — I2510 Atherosclerotic heart disease of native coronary artery without angina pectoris: Secondary | ICD-10-CM

## 2021-08-04 MED ORDER — NITROGLYCERIN 0.4 MG SL SUBL: 0.4000 mg | SUBLINGUAL_TABLET | SUBLINGUAL | 2 refills | Status: DC | PRN

## 2021-08-15 ENCOUNTER — Other Ambulatory Visit: Payer: Self-pay | Admitting: *Deleted

## 2021-08-15 NOTE — Patient Outreach (Addendum)
[facility]) Care Management  08/15/2021  Shane Kerr 10-23-1955 [id number]   THN Case closure -unable to maintain contact  Last successful outreach 05/12/21 Unsuccessful outreach letter sent on 07/18/21 Unsuccessful outreach on 07/07/21, 07/18/21 08/01/21 without a response   Plan [facility] RN CM will close case after no response from patient within 10 business days. Unable to maintain contact  Case closure letters sent to patient and MD   Goals Addressed               This Visit's Progress     Patient Stated     COMPLETED: [facility]) Manage My Emotions (pt-stated)        Timeframe:  Short-Term Goal Priority:  Medium Start Date:                  01/24/21           Expected End Date:     03/02/21                  Closed 05/12/21 Barriers: Knowledge  - talk about feelings with a friend, family or spiritual advisor  -work with tools at home    Notes:  05/12/21 denies any coping concerns further -goal completed  04/26/21 unsuccessful outreach 02/22/21 doing better continue with medical concerns of his family members but his wife cares for them  01/24/21 concern about his daughter and in laws. Has not started monitoring and documenting his BP values      COMPLETED: (THN) Track and Manage My Blood Pressure-Hypertension (pt-stated)        Timeframe:  Long-Range Goal Priority:  High Start Date:            12/09/20                 Expected End Date:        11/31/22                Barriers: Knowledge   - check blood pressure weekly - choose a place to take my blood pressure (home, clinic or office, retail store) - write blood pressure results in a log or diary  Have wife (nurse) check Blood pressure prn  08/15/21 case closure on 08/15/21 unable to maintain contact  Unsuccessful outreach letter sent on 07/18/21 Unsuccessful outreach on 07/07/21, 07/18/21 08/01/21  05/12/21 reports BP fine as wife checks it Unable to offer values today.  04/26/21 unsuccessful  outreach 02/22/21 weekly BP values -As 125/76 68 on 01/26/21, 138/73 78 on 02/09/21 and 128/70 78 on 02/21/21 He reports he was active with each of these checks Resolving stressors with participation in structured counseling program 01/24/21 concern about his daughter and in laws. Has not started monitoring and documenting his BP values but has received BP cuff mailed to him by [facility]  01/06/21 unsuccessful outreach  12/09/20 Pt will to participate in program and start monitoring BP         [name] L. Noelle Penner, RN, BSN, CCM [facility] Telephonic Care Management Care Coordinator Office number [phone] Mobile number [phone]  Main [facility] number [phone] Fax number [phone]

## 2021-08-18 ENCOUNTER — Ambulatory Visit: Payer: 59 | Admitting: Cardiology

## 2021-08-18 ENCOUNTER — Encounter: Payer: Self-pay | Admitting: Cardiology

## 2021-08-18 ENCOUNTER — Other Ambulatory Visit: Payer: Self-pay

## 2021-08-18 VITALS — BP 145/88 | HR 47 | Temp 98.0°F | Resp 16 | Ht 71.0 in | Wt 220.0 lb

## 2021-08-18 DIAGNOSIS — I493 Ventricular premature depolarization: Secondary | ICD-10-CM

## 2021-08-18 DIAGNOSIS — I2510 Atherosclerotic heart disease of native coronary artery without angina pectoris: Secondary | ICD-10-CM

## 2021-08-18 DIAGNOSIS — E782 Mixed hyperlipidemia: Secondary | ICD-10-CM | POA: Diagnosis not present

## 2021-08-18 DIAGNOSIS — I10 Essential (primary) hypertension: Secondary | ICD-10-CM

## 2021-08-18 MED ORDER — DILTIAZEM HCL ER COATED BEADS 120 MG PO CP24: 120.0000 mg | ORAL_CAPSULE | Freq: Every day | ORAL | 3 refills | Status: DC

## 2021-08-18 NOTE — Progress Notes (Signed)
Follow up visit  Subjective:   Shane Kerr, male    DOB: 07-02-1956, 65 y.o.   MRN: [id number]    Chief Complaint  Patient presents with   Coronary Artery Disease   Follow-up    1 year    HPI  65 y/o Caucasian male with CAD, NSTEMI 01/2019, hypertension.  Patient is here for follow up visit. He has been stressed with health of her daughter and in-laws. He denies any exertional chest pain or dyspnea. He reports occasional episodes of sharp pain in his chest for a second or two, when he lays down at night. He reports that his heart rate has been counted low at his recent PCP visits.   He has had long standing h/o PVCs, did not tolerate metoprolol due to fatigue. He was taking coreg at one point, but not on it now.    Current Outpatient Medications on File Prior to Visit  Medication Sig Dispense Refill   ASPIRIN LOW DOSE 81 MG EC tablet TAKE 1 TABLET BY MOUTH EVERY DAY 90 tablet 2   atorvastatin (LIPITOR) 80 MG tablet TAKE 1 TABLET BY MOUTH DAILY AT 6 PM. 90 tablet 1   [employer] GENTLE LAXATIVE 5 MG EC tablet See admin instructions.     nitroGLYCERIN (NITROSTAT) 0.4 MG SL tablet Place 1 tablet (0.4 mg total) under the tongue every 5 (five) minutes as needed for chest pain (Up to 3 doses in 15 minutes.). 30 tablet 2   polyethylene glycol-electrolytes (NULYTELY) 420 g solution See admin instructions.     predniSONE (DELTASONE) 20 MG tablet Take 40 mg by mouth daily.     sertraline (ZOLOFT) 100 MG tablet Take 1 tablet by mouth daily.     triamcinolone ointment (KENALOG) 0.1 % 1 application     No current facility-administered medications on file prior to visit.    Cardiovascular studies:  EKG 08/18/2021: Sinus rhythm 95 bpm Frequent PVCs  Occasional PAC  Lexiscan Sestamibi Stress Test 02/09/2019: Stress EKG is non-diagnostic, as this is pharmacological stress test. Normal myocardial perfusion. Stress LV EF: 57%.  Low risk study.   Coronary angiography 01/05/2019: LM:  Normal LAD: Prox 50% focal stenosis. Mid 60-70% diffuse disease        Culprit artery D2 with ostial 90% and lateral branch ostial 75% stenosis        Successful balloon angioplasty with 20% residual stenosis. LCx: Normal RCA: RPDA 40% disease Normal LVEDP Normal LVEF   Recommendation: Aggressive medical management. DAPT for 1 year High intensity statin Lipitor 40 mg, metoprolol 25 mg bid. Diet and lifestyle modifications, cardiac rehab     Echocardiogram 01/05/2019:  1. The left ventricle has normal systolic function, with an ejection fraction of 60-65%. The cavity size was normal. Left ventricular diastolic Doppler parameters are consistent with impaired relaxation. No evidence of left ventricular regional wall  motion abnormalities.  2. No significant valvular abnormalities.  Recent labs: 08/13/2019: Chol 92, TG 47, HDL 51, LDL 29  01/04/2019: Chol 138, TG 31, HDL 57, LDL 75   Review of Systems  Cardiovascular:  Negative for chest pain, dyspnea on exertion, leg swelling, palpitations and syncope.        Vitals:   08/18/21 0838  BP: (!) 163/69  Pulse: (!) 42  Resp: 16  Temp: 98 F (36.7 C)  SpO2: 98%    Objective:   Physical Exam Vitals and nursing note reviewed.  Constitutional:      General: He is not in  acute distress. Neck:     Vascular: No JVD.  Cardiovascular:     Rate and Rhythm: Normal rate and regular rhythm.     Pulses: Intact distal pulses.     Heart sounds: Normal heart sounds. No murmur heard. Pulmonary:     Effort: Pulmonary effort is normal.     Breath sounds: Normal breath sounds. No wheezing or rales.          Assessment & Recommendations:   65 y/o Caucasian male with CAD, NSTEMI 01/2019, hypertension.  CAD: Currently no angina symptoms. NSTEMI in 01/2019 with culprit vessel ostial diagonal treated with balloon angioplasty. He has residual disease in nonculprit pLAD. Lexiscan nuclear stress test 02/2019 with no  ischemia/infarction. Continue Aspirin, lipitor 80 mg daily Check labs, including lipid panel  Frequent PVC's: Added diltiazem 120 mg daily. Check monitor in a month from now.  Will also check echocardiogram at that time.   Hypertension: Anticipate that diltiazem will help  F/u in 2 months  Brendia Dampier Emiliano Dyer, MD [facility] Pager: [phone] Office: [phone] If no answer Cell [phone]

## 2021-08-23 DIAGNOSIS — I493 Ventricular premature depolarization: Secondary | ICD-10-CM | POA: Diagnosis not present

## 2021-08-23 DIAGNOSIS — I2510 Atherosclerotic heart disease of native coronary artery without angina pectoris: Secondary | ICD-10-CM | POA: Diagnosis not present

## 2021-08-24 ENCOUNTER — Ambulatory Visit: Payer: 59 | Admitting: Cardiology

## 2021-08-24 LAB — LIPID PANEL
Chol/HDL Ratio: 1.9 ratio (ref 0.0–5.0)
Cholesterol, Total: 101 mg/dL (ref 100–199)
HDL: 54 mg/dL (ref >39)
LDL Chol Calc (NIH): 36 mg/dL (ref 0–99)
Triglycerides: 41 mg/dL (ref 0–149)
VLDL Cholesterol Cal: 11 mg/dL (ref 5–40)

## 2021-08-24 LAB — CBC
Hematocrit: 47.6 % (ref 37.5–51.0)
Hemoglobin: 16.3 g/dL (ref 13.0–17.7)
MCH: 30.7 pg (ref 26.6–33.0)
MCHC: 34.2 g/dL (ref 31.5–35.7)
MCV: 90 fL (ref 79–97)
Platelets: 199 10*3/uL (ref 150–450)
RBC: 5.31 x10E6/uL (ref 4.14–5.80)
RDW: 11.9 % (ref 11.6–15.4)
WBC: 7.3 10*3/uL (ref 3.4–10.8)

## 2021-08-24 LAB — BASIC METABOLIC PANEL
BUN/Creatinine Ratio: 15 (ref 10–24)
BUN: 19 mg/dL (ref 8–27)
CO2: 21 mmol/L (ref 20–29)
Calcium: 9.4 mg/dL (ref 8.6–10.2)
Chloride: 106 mmol/L (ref 96–106)
Creatinine, Ser: 1.23 mg/dL (ref 0.76–1.27)
Glucose: 101 mg/dL — ABNORMAL HIGH (ref 70–99)
Potassium: 4.6 mmol/L (ref 3.5–5.2)
Sodium: 140 mmol/L (ref 134–144)
eGFR: 65 mL/min/{1.73_m2} (ref >59)

## 2021-09-22 ENCOUNTER — Inpatient Hospital Stay: Payer: 59

## 2021-09-22 ENCOUNTER — Other Ambulatory Visit: Payer: Self-pay

## 2021-09-22 DIAGNOSIS — I493 Ventricular premature depolarization: Secondary | ICD-10-CM

## 2021-10-06 ENCOUNTER — Other Ambulatory Visit: Payer: Self-pay

## 2021-10-06 DIAGNOSIS — I2510 Atherosclerotic heart disease of native coronary artery without angina pectoris: Secondary | ICD-10-CM

## 2021-10-06 MED ORDER — NITROGLYCERIN 0.4 MG SL SUBL: 0.4000 mg | SUBLINGUAL_TABLET | SUBLINGUAL | 2 refills | Status: DC | PRN

## 2021-10-20 DIAGNOSIS — I493 Ventricular premature depolarization: Secondary | ICD-10-CM | POA: Diagnosis not present

## 2021-10-25 ENCOUNTER — Other Ambulatory Visit: Payer: Self-pay

## 2021-10-25 ENCOUNTER — Ambulatory Visit: Payer: 59 | Admitting: Cardiology

## 2021-10-25 ENCOUNTER — Encounter: Payer: Self-pay | Admitting: Cardiology

## 2021-10-25 VITALS — BP 137/82 | HR 41 | Temp 97.4°F | Resp 17 | Ht 71.0 in | Wt 216.2 lb

## 2021-10-25 DIAGNOSIS — I493 Ventricular premature depolarization: Secondary | ICD-10-CM | POA: Diagnosis not present

## 2021-10-25 DIAGNOSIS — I10 Essential (primary) hypertension: Secondary | ICD-10-CM | POA: Diagnosis not present

## 2021-10-25 MED ORDER — DILTIAZEM HCL ER COATED BEADS 120 MG PO CP24: 120.0000 mg | ORAL_CAPSULE | Freq: Every day | ORAL | 3 refills | Status: DC

## 2021-10-25 NOTE — Progress Notes (Signed)
Follow up visit  Subjective:   Shane Kerr, male    DOB: [birth year], 66 y.o.   MRN: [id number]    Chief Complaint  Patient presents with   Follow-up   Results    HPI  66 y/o Caucasian male with CAD, NSTEMI 01/2019, hypertension.  Patient is doing better on diltiazem.  Palpitation symptoms have improved.  Sleep has improved after coming off metoprolol.  Reviewed recent test results with the patient, details below.    Current Outpatient Medications on File Prior to Visit  Medication Sig Dispense Refill   ASPIRIN LOW DOSE 81 MG EC tablet TAKE 1 TABLET BY MOUTH EVERY DAY 90 tablet 2   atorvastatin (LIPITOR) 80 MG tablet TAKE 1 TABLET BY MOUTH DAILY AT 6 PM. 90 tablet 1   diltiazem (CARDIZEM CD) 120 MG 24 hr capsule Take 1 capsule (120 mg total) by mouth daily. 90 capsule 3   nitroGLYCERIN (NITROSTAT) 0.4 MG SL tablet Place 1 tablet (0.4 mg total) under the tongue every 5 (five) minutes as needed for chest pain (Up to 3 doses in 15 minutes.). 30 tablet 2   sertraline (ZOLOFT) 100 MG tablet Take 1 tablet by mouth daily.     No current facility-administered medications on file prior to visit.    Cardiovascular studies:  Mobile cardiac telemetry 14 days 09/22/2021 - 10/06/2021: Dominant rhythm: Sinus. HR 48-144 bpm. Avg HR 83 bpm, in sinus rhythm. 1 episode of SVT at 197 bpm for 9 beats. <1% isolated SVEand couplets. 1551 episodes of VT, fastest at 187 bpm for 4 beats, longest for 5 beats at 136 bpm. 21.3% isolated VE, 11% couplet, 4.5% triplets. No atrial fibrillation/atrial flutter//high grade AV block, sinus pause >3sec noted. 0 patient triggered events.   EKG 08/18/2021: Sinus rhythm 95 bpm Frequent PVCs  Occasional PAC  Lexiscan Sestamibi Stress Test 02/09/2019: Stress EKG is non-diagnostic, as this is pharmacological stress test. Normal myocardial perfusion. Stress LV EF: 57%.  Low risk study.   Coronary angiography 01/05/2019: LM: Normal LAD: Prox 50% focal  stenosis. Mid 60-70% diffuse disease        Culprit artery D2 with ostial 90% and lateral branch ostial 75% stenosis        Successful balloon angioplasty with 20% residual stenosis. LCx: Normal RCA: RPDA 40% disease Normal LVEDP Normal LVEF   Recommendation: Aggressive medical management. DAPT for 1 year High intensity statin Lipitor 40 mg, metoprolol 25 mg bid. Diet and lifestyle modifications, cardiac rehab     Echocardiogram 01/05/2019:  1. The left ventricle has normal systolic function, with an ejection fraction of 60-65%. The cavity size was normal. Left ventricular diastolic Doppler parameters are consistent with impaired relaxation. No evidence of left ventricular regional wall  motion abnormalities.  2. No significant valvular abnormalities.  Recent labs: 08/23/2022: Glucose 101, BUN/Cr 19/1.23. EGFR 65. Na/K 140/4.6. Rest of the CMP normal H/H 16/47. MCV 90. Platelets 199 Chol 101, TG 41, HDL 54, LDL 36  08/13/2019: Chol 92, TG 47, HDL 51, LDL 29  01/04/2019: Chol 138, TG 31, HDL 57, LDL 75   Review of Systems  Cardiovascular:  Negative for chest pain, dyspnea on exertion, leg swelling, palpitations and syncope.        There were no vitals filed for this visit.   Objective:   Physical Exam Vitals and nursing note reviewed.  Constitutional:      General: He is not in acute distress. Neck:     Vascular: No JVD.  Cardiovascular:     Rate and Rhythm: Normal rate and regular rhythm.     Pulses: Intact distal pulses.     Heart sounds: Normal heart sounds. No murmur heard. Pulmonary:     Effort: Pulmonary effort is normal.     Breath sounds: Normal breath sounds. No wheezing or rales.          Assessment & Recommendations:   66 y/o Caucasian male with CAD, NSTEMI 01/2019, hypertension.  CAD: Currently no angina symptoms. NSTEMI in 01/2019 with culprit vessel ostial diagonal treated with balloon angioplasty. He has residual disease in nonculprit pLAD.  Lexiscan nuclear stress test 02/2019 with no ischemia/infarction. Continue Aspirin, lipitor 80 mg daily  Frequent PVC's: Symptoms improved on diltiazem 120 mg daily. 21% PVC burden with >1500 short NSVT runs of 3-5 beats Echocardiogram pending. In future, if he has worsening symptoms, will consider referral to EP.   Hypertension: Anticipate that diltiazem will help   F/u in 6 months  [place], MD [facility] Pager: [phone] Office: [phone] If no answer Cell [phone]

## 2021-11-05 ENCOUNTER — Other Ambulatory Visit: Payer: Self-pay | Admitting: Cardiology

## 2021-11-27 ENCOUNTER — Ambulatory Visit: Payer: 59

## 2021-11-27 ENCOUNTER — Other Ambulatory Visit: Payer: Self-pay

## 2021-11-27 DIAGNOSIS — I10 Essential (primary) hypertension: Secondary | ICD-10-CM

## 2021-11-27 DIAGNOSIS — I493 Ventricular premature depolarization: Secondary | ICD-10-CM

## 2022-03-15 DIAGNOSIS — R69 Illness, unspecified: Secondary | ICD-10-CM | POA: Diagnosis not present

## 2022-03-15 DIAGNOSIS — Z23 Encounter for immunization: Secondary | ICD-10-CM | POA: Diagnosis not present

## 2022-03-15 DIAGNOSIS — Z1211 Encounter for screening for malignant neoplasm of colon: Secondary | ICD-10-CM | POA: Diagnosis not present

## 2022-03-15 DIAGNOSIS — Z0000 Encounter for general adult medical examination without abnormal findings: Secondary | ICD-10-CM | POA: Diagnosis not present

## 2022-03-15 DIAGNOSIS — Z125 Encounter for screening for malignant neoplasm of prostate: Secondary | ICD-10-CM | POA: Diagnosis not present

## 2022-04-25 ENCOUNTER — Ambulatory Visit: Payer: 59 | Admitting: Cardiology

## 2022-04-25 ENCOUNTER — Encounter: Payer: Self-pay | Admitting: Cardiology

## 2022-04-25 VITALS — BP 147/97 | HR 53 | Temp 98.0°F | Resp 16 | Ht 71.0 in | Wt 222.0 lb

## 2022-04-25 DIAGNOSIS — I493 Ventricular premature depolarization: Secondary | ICD-10-CM

## 2022-04-25 DIAGNOSIS — I2510 Atherosclerotic heart disease of native coronary artery without angina pectoris: Secondary | ICD-10-CM

## 2022-04-25 DIAGNOSIS — I10 Essential (primary) hypertension: Secondary | ICD-10-CM | POA: Diagnosis not present

## 2022-04-25 MED ORDER — DILTIAZEM HCL ER COATED BEADS 120 MG PO CP24: 120.0000 mg | ORAL_CAPSULE | Freq: Every day | ORAL | 3 refills | Status: DC

## 2022-04-25 NOTE — Progress Notes (Signed)
Follow up visit  Subjective:   Shane Kerr, male    DOB: 1956-06-11, 66 y.o.   MRN: [id number]    Chief Complaint  Patient presents with   Coronary Artery Disease   PVC   Follow-up    6 month    HPI  66 y/o Caucasian male with hypertension, CAD, PVC  Patient is doing much better on diltiazem. Blood pressure is elevated today, but patient reports that it is lower at home.    Current Outpatient Medications:    ASPIRIN LOW DOSE 81 MG EC tablet, TAKE 1 TABLET BY MOUTH EVERY DAY, Disp: 90 tablet, Rfl: 2   atorvastatin (LIPITOR) 80 MG tablet, TAKE 1 TABLET BY MOUTH DAILY AT 6 PM., Disp: 90 tablet, Rfl: 1   diltiazem (CARDIZEM CD) 120 MG 24 hr capsule, Take 1 capsule (120 mg total) by mouth daily., Disp: 90 capsule, Rfl: 3   nitroGLYCERIN (NITROSTAT) 0.4 MG SL tablet, Place 1 tablet (0.4 mg total) under the tongue every 5 (five) minutes as needed for chest pain (Up to 3 doses in 15 minutes.)., Disp: 30 tablet, Rfl: 2   sertraline (ZOLOFT) 100 MG tablet, Take 1 tablet by mouth daily., Disp: , Rfl:   Cardiovascular studies:  EKG 04/25/2022: Sinus rhythm 75 bpm Borderline left atrial enlargement Two PVCs  Echocardiogram 11/27/2021:  EKG: NSR with PVCs (single lead rhythm strip).  Normal LV systolic function with visual EF 60-65%. Left ventricle cavity  is normal in size. Normal left ventricular wall thickness. Normal global  wall motion. Normal diastolic filling pattern, normal LAP.  No significant valvular heart disease.  Compared to study 01/05/2019 no significant change.   Mobile cardiac telemetry 14 days 09/22/2021 - 10/06/2021: Dominant rhythm: Sinus. HR 48-144 bpm. Avg HR 83 bpm, in sinus rhythm. 1 episode of SVT at 197 bpm for 9 beats. <1% isolated SVEand couplets. 1551 episodes of VT, fastest at 187 bpm for 4 beats, longest for 5 beats at 136 bpm. 21.3% isolated VE, 11% couplet, 4.5% triplets. No atrial fibrillation/atrial flutter//high grade AV block, sinus pause  >3sec noted. 0 patient triggered events.   Lexiscan Sestamibi Stress Test 02/09/2019: Stress EKG is non-diagnostic, as this is pharmacological stress test. Normal myocardial perfusion. Stress LV EF: 57%.  Low risk study.   Coronary angiography 01/05/2019: LM: Normal LAD: Prox 50% focal stenosis. Mid 60-70% diffuse disease        Culprit artery D2 with ostial 90% and lateral branch ostial 75% stenosis        Successful balloon angioplasty with 20% residual stenosis. LCx: Normal RCA: RPDA 40% disease Normal LVEDP Normal LVEF   Recommendation: Aggressive medical management. DAPT for 1 year High intensity statin Lipitor 40 mg, metoprolol 25 mg bid. Diet and lifestyle modifications, cardiac rehab   Recent labs: 08/23/2021: Glucose 101, BUN/Cr 19/1.23. EGFR 65. Na/K 140/4.6. Rest of the CMP normal H/H 16/47. MCV 90. Platelets 199 Chol 101, TG 41, HDL 54, LDL 36  Review of Systems  Cardiovascular:  Negative for chest pain, dyspnea on exertion, leg swelling, palpitations and syncope.         Vitals:   04/25/22 0849  BP: (!) 147/97  Pulse: (!) 53  Resp: 16  Temp: 98 F (36.7 C)  SpO2: 97%    Objective:   Physical Exam Vitals and nursing note reviewed.  Constitutional:      General: He is not in acute distress. Neck:     Vascular: No JVD.  Cardiovascular:  Rate and Rhythm: Normal rate and regular rhythm.     Pulses: Intact distal pulses.     Heart sounds: Normal heart sounds. No murmur heard. Pulmonary:     Effort: Pulmonary effort is normal.     Breath sounds: Normal breath sounds. No wheezing or rales.  Musculoskeletal:     Right lower leg: No edema.     Left lower leg: No edema.           Assessment & Recommendations:   66 y/o Caucasian male with hypertension, CAD, PVC  Frequent PVC's: Symptoms improved on diltiazem 120 mg daily. 21% PVC burden with >1500 short NSVT runs of 3-5 beats Normal EF (2023) PVC's seem to have improved, only two seen on  EKG today. Continue diltiazem for now.  CAD: Currently no angina symptoms. NSTEMI in 01/2019 with culprit vessel ostial diagonal treated with balloon angioplasty. He has residual disease in nonculprit pLAD. Lexiscan nuclear stress test 02/2019 with no ischemia/infarction. Continue Aspirin, lipitor 80 mg daily  Hypertension: BP elevated today, but he is reluctant to make changes today. Check BP at home. He will send me a MyChart message in a few weeks. If SBP remains >140 mmHg, could consider increasing diltiazem to 180 or 240 mg daily.  F/u in 6 months  Gevorg Brum Esther Hardy, MD [facility] [place] Pager: [phone] Office: [phone] If no answer Cell [phone]

## 2022-05-08 ENCOUNTER — Other Ambulatory Visit: Payer: Self-pay | Admitting: Cardiology

## 2022-06-13 DIAGNOSIS — D179 Benign lipomatous neoplasm, unspecified: Secondary | ICD-10-CM | POA: Diagnosis not present

## 2022-06-13 DIAGNOSIS — L821 Other seborrheic keratosis: Secondary | ICD-10-CM | POA: Diagnosis not present

## 2022-07-06 ENCOUNTER — Ambulatory Visit: Payer: Self-pay

## 2022-07-06 NOTE — Patient Outreach (Signed)
  Care Coordination   07/06/2022 Name: Shane Kerr MRN: [id number] DOB: 16-Aug-1956   Care Coordination Outreach Attempts:  An unsuccessful telephone outreach was attempted today to offer the patient information about available care coordination services as a benefit of their health plan.   Follow Up Plan:  Additional outreach attempts will be made to offer the patient care coordination information and services.   Encounter Outcome:  No Answer  Care Coordination Interventions Activated:  No   Care Coordination Interventions:  No, not indicated    Daneen Schick, BSW, CDP Social Worker, Certified Dementia Practitioner [facility] Management  Care Coordination [phone]

## 2022-07-16 DIAGNOSIS — Z1211 Encounter for screening for malignant neoplasm of colon: Secondary | ICD-10-CM | POA: Diagnosis not present

## 2022-08-01 DIAGNOSIS — J029 Acute pharyngitis, unspecified: Secondary | ICD-10-CM | POA: Diagnosis not present

## 2022-08-30 DIAGNOSIS — K648 Other hemorrhoids: Secondary | ICD-10-CM | POA: Diagnosis not present

## 2022-08-30 DIAGNOSIS — K5730 Diverticulosis of large intestine without perforation or abscess without bleeding: Secondary | ICD-10-CM | POA: Diagnosis not present

## 2022-08-30 DIAGNOSIS — Z1211 Encounter for screening for malignant neoplasm of colon: Secondary | ICD-10-CM | POA: Diagnosis not present

## 2022-10-24 ENCOUNTER — Ambulatory Visit: Payer: 59 | Admitting: Cardiology

## 2022-10-24 ENCOUNTER — Encounter: Payer: Self-pay | Admitting: Cardiology

## 2022-10-24 VITALS — BP 138/84 | HR 53 | Resp 16 | Ht 71.0 in | Wt 225.0 lb

## 2022-10-24 DIAGNOSIS — I2510 Atherosclerotic heart disease of native coronary artery without angina pectoris: Secondary | ICD-10-CM | POA: Diagnosis not present

## 2022-10-24 DIAGNOSIS — I10 Essential (primary) hypertension: Secondary | ICD-10-CM | POA: Diagnosis not present

## 2022-10-24 DIAGNOSIS — I493 Ventricular premature depolarization: Secondary | ICD-10-CM | POA: Diagnosis not present

## 2022-10-24 MED ORDER — DILTIAZEM HCL ER COATED BEADS 240 MG PO CP24: 240.0000 mg | ORAL_CAPSULE | Freq: Every day | ORAL | 3 refills | Status: DC

## 2022-10-24 NOTE — Progress Notes (Signed)
Follow up visit  Subjective:   Shane Kerr, male    DOB: 07-07-1956, 67 y.o.   MRN: [id number]    Chief Complaint  Patient presents with   Coronary Artery Disease   Follow-up    48 month    HPI  67 y/o Caucasian male with hypertension, CAD, PVC  Patient still has a lot of stress with family related issues, but denies any chest pain.  Reports occasional shortness of breath.  He has not felt any significant PVCs symptoms as he has in the past, recently started diltiazem.    Current Outpatient Medications:    ASPIRIN LOW DOSE 81 MG EC tablet, TAKE 1 TABLET BY MOUTH EVERY DAY, Disp: 90 tablet, Rfl: 2   atorvastatin (LIPITOR) 80 MG tablet, TAKE 1 TABLET BY MOUTH DAILY AT 6 PM., Disp: 90 tablet, Rfl: 1   diltiazem (CARDIZEM CD) 120 MG 24 hr capsule, Take 1 capsule (120 mg total) by mouth daily., Disp: 90 capsule, Rfl: 3   nitroGLYCERIN (NITROSTAT) 0.4 MG SL tablet, Place 1 tablet (0.4 mg total) under the tongue every 5 (five) minutes as needed for chest pain (Up to 3 doses in 15 minutes.)., Disp: 30 tablet, Rfl: 2   sertraline (ZOLOFT) 100 MG tablet, Take 1 tablet by mouth daily., Disp: , Rfl:   Cardiovascular studies:  EKG 04/25/2022: Sinus rhythm 75 bpm Borderline left atrial enlargement Two PVCs  Echocardiogram 11/27/2021:  EKG: NSR with PVCs (single lead rhythm strip).  Normal LV systolic function with visual EF 60-65%. Left ventricle cavity  is normal in size. Normal left ventricular wall thickness. Normal global  wall motion. Normal diastolic filling pattern, normal LAP.  No significant valvular heart disease.  Compared to study 01/05/2019 no significant change.   Mobile cardiac telemetry 14 days 09/22/2021 - 10/06/2021: Dominant rhythm: Sinus. HR 48-144 bpm. Avg HR 83 bpm, in sinus rhythm. 1 episode of SVT at 197 bpm for 9 beats. <1% isolated SVEand couplets. 1551 episodes of VT, fastest at 187 bpm for 4 beats, longest for 5 beats at 136 bpm. 21.3% isolated VE, 11%  couplet, 4.5% triplets. No atrial fibrillation/atrial flutter//high grade AV block, sinus pause >3sec noted. 0 patient triggered events.   Lexiscan Sestamibi Stress Test 02/09/2019: Stress EKG is non-diagnostic, as this is pharmacological stress test. Normal myocardial perfusion. Stress LV EF: 57%.  Low risk study.   Coronary angiography 01/05/2019: LM: Normal LAD: Prox 50% focal stenosis. Mid 60-70% diffuse disease        Culprit artery D2 with ostial 90% and lateral branch ostial 75% stenosis        Successful balloon angioplasty with 20% residual stenosis. LCx: Normal RCA: RPDA 40% disease Normal LVEDP Normal LVEF   Recommendation: Aggressive medical management. DAPT for 1 year High intensity statin Lipitor 40 mg, metoprolol 25 mg bid. Diet and lifestyle modifications, cardiac rehab   Recent labs: 08/23/2021: Glucose 101, BUN/Cr 19/1.23. EGFR 65. Na/K 140/4.6. Rest of the CMP normal H/H 16/47. MCV 90. Platelets 199 Chol 101, TG 41, HDL 54, LDL 36  Review of Systems  Cardiovascular:  Positive for dyspnea on exertion. Negative for chest pain, leg swelling, palpitations and syncope.         Vitals:   10/24/22 0839  BP: 138/84  Pulse: (!) 53  Resp: 16  SpO2: 98%    Objective:   Physical Exam Vitals and nursing note reviewed.  Constitutional:      General: He is not in acute distress. Neck:  Vascular: No JVD.  Cardiovascular:     Rate and Rhythm: Normal rate and regular rhythm. Frequent Extrasystoles are present.    Pulses: Intact distal pulses.     Heart sounds: Normal heart sounds. No murmur heard. Pulmonary:     Effort: Pulmonary effort is normal.     Breath sounds: Normal breath sounds. No wheezing or rales.  Musculoskeletal:     Right lower leg: No edema.     Left lower leg: No edema.           Assessment & Recommendations:   67 y/o Caucasian male with hypertension, CAD, PVC  Frequent PVC's: Ventricular bigeminy on EKG today.   Increase  diltiazem to 240 mg daily. Check echocardiogram and 2-week monitor in 4 weeks from now.    CAD: Currently no angina symptoms. NSTEMI in 01/2019 with culprit vessel ostial diagonal treated with balloon angioplasty. He has residual disease in nonculprit pLAD. Lexiscan nuclear stress test 02/2019 with no ischemia/infarction. Continue Aspirin, lipitor 80 mg daily Check lipid panel, BMP today.  Hypertension: Fairly well-controlled.  Regardless, I increased his diltiazem to 240 mg daily for treatment of PVCs.  F/u in 6 weeks   Nigel Mormon, MD Pager: [phone] Office: [phone]

## 2022-11-14 DIAGNOSIS — I10 Essential (primary) hypertension: Secondary | ICD-10-CM | POA: Diagnosis not present

## 2022-11-15 LAB — BASIC METABOLIC PANEL
BUN/Creatinine Ratio: 13 (ref 10–24)
BUN: 17 mg/dL (ref 8–27)
CO2: 20 mmol/L (ref 20–29)
Calcium: 9.5 mg/dL (ref 8.6–10.2)
Chloride: 101 mmol/L (ref 96–106)
Creatinine, Ser: 1.29 mg/dL — ABNORMAL HIGH (ref 0.76–1.27)
Glucose: 116 mg/dL — ABNORMAL HIGH (ref 70–99)
Potassium: 4.4 mmol/L (ref 3.5–5.2)
Sodium: 136 mmol/L (ref 134–144)
eGFR: 60 mL/min/{1.73_m2} (ref >59)

## 2022-11-15 LAB — LIPID PANEL
Chol/HDL Ratio: 2.9 ratio (ref 0.0–5.0)
Cholesterol, Total: 158 mg/dL (ref 100–199)
HDL: 54 mg/dL (ref >39)
LDL Chol Calc (NIH): 89 mg/dL (ref 0–99)
Triglycerides: 76 mg/dL (ref 0–149)
VLDL Cholesterol Cal: 15 mg/dL (ref 5–40)

## 2022-12-24 ENCOUNTER — Ambulatory Visit: Payer: 59 | Admitting: Cardiology

## 2023-01-01 ENCOUNTER — Telehealth: Payer: Self-pay

## 2023-01-01 DIAGNOSIS — R5383 Other fatigue: Secondary | ICD-10-CM | POA: Diagnosis not present

## 2023-01-01 NOTE — Telephone Encounter (Signed)
Patient has been experiencing dizziness and fatigue since taking the increased dosage of Diltiazem. Heart rate has been in the 40's low 50's. He would like to go back on the 120mg  dosage.

## 2023-01-01 NOTE — Telephone Encounter (Signed)
MP patient

## 2023-01-02 NOTE — Telephone Encounter (Signed)
Non-urgent patient says he can wait for answer when you return to office.

## 2023-01-14 NOTE — Telephone Encounter (Signed)
Patient called again and mention he has been experiencing dizziness and fatigue since taking the increased dosage of Diltiazem. Heart rate has been in the 40's low 50's. He would like to go back on the 120mg  dosage

## 2023-01-16 NOTE — Telephone Encounter (Signed)
Agree. Please reduce back to 120 mg daily for now. Will follow up after upcoming echocardiogram.  Thanks MJP

## 2023-01-17 ENCOUNTER — Other Ambulatory Visit: Payer: Self-pay

## 2023-01-17 MED ORDER — DILTIAZEM HCL ER COATED BEADS 120 MG PO CP24: 120.0000 mg | ORAL_CAPSULE | Freq: Every day | ORAL | 0 refills | Status: DC

## 2023-01-17 NOTE — Telephone Encounter (Signed)
Called patient no answer left a vm

## 2023-01-21 ENCOUNTER — Ambulatory Visit: Payer: 59

## 2023-01-21 DIAGNOSIS — I10 Essential (primary) hypertension: Secondary | ICD-10-CM

## 2023-01-21 DIAGNOSIS — I493 Ventricular premature depolarization: Secondary | ICD-10-CM | POA: Diagnosis not present

## 2023-01-31 ENCOUNTER — Ambulatory Visit: Payer: 59 | Admitting: Cardiology

## 2023-02-13 DIAGNOSIS — I493 Ventricular premature depolarization: Secondary | ICD-10-CM | POA: Diagnosis not present

## 2023-02-25 ENCOUNTER — Ambulatory Visit: Payer: 59 | Admitting: Cardiology

## 2023-02-27 ENCOUNTER — Encounter: Payer: Self-pay | Admitting: Cardiology

## 2023-02-27 ENCOUNTER — Ambulatory Visit: Payer: 59 | Admitting: Cardiology

## 2023-02-27 VITALS — BP 148/80 | HR 52 | Ht 71.0 in | Wt 226.0 lb

## 2023-02-27 DIAGNOSIS — I10 Essential (primary) hypertension: Secondary | ICD-10-CM | POA: Diagnosis not present

## 2023-02-27 DIAGNOSIS — I2510 Atherosclerotic heart disease of native coronary artery without angina pectoris: Secondary | ICD-10-CM

## 2023-02-27 DIAGNOSIS — I493 Ventricular premature depolarization: Secondary | ICD-10-CM | POA: Diagnosis not present

## 2023-02-27 MED ORDER — METOPROLOL SUCCINATE ER 25 MG PO TB24: 25.0000 mg | ORAL_TABLET | Freq: Every day | ORAL | 3 refills | Status: DC

## 2023-02-27 MED ORDER — ATORVASTATIN CALCIUM 80 MG PO TABS: ORAL_TABLET | ORAL | 3 refills | Status: DC

## 2023-02-27 NOTE — Progress Notes (Signed)
Follow up visit  Subjective:   Shane Kerr, male    DOB: [birth year], 67 y.o.   MRN: [id number]    Chief Complaint  Patient presents with   Frequent PVCs   Follow-up    HPI  67 y/o Caucasian male with hypertension, CAD, PVC  Patient stopped taking diltiazem due to complaints of dizziness, and fatigue.  This includes both 120 mg, and 240 mg dosages.  Reviewed recent monitor and echocardiogram results with the patient, details below.   Current Outpatient Medications:    ASPIRIN LOW DOSE 81 MG EC tablet, TAKE 1 TABLET BY MOUTH EVERY DAY, Disp: 90 tablet, Rfl: 2   atorvastatin (LIPITOR) 80 MG tablet, TAKE 1 TABLET BY MOUTH DAILY AT 6 PM., Disp: 90 tablet, Rfl: 1   diltiazem (CARDIZEM CD) 120 MG 24 hr capsule, Take 1 capsule (120 mg total) by mouth daily., Disp: 90 capsule, Rfl: 0   diltiazem (CARDIZEM LA) 240 MG 24 hr tablet, Take 240 mg by mouth daily., Disp: , Rfl:    nitroGLYCERIN (NITROSTAT) 0.4 MG SL tablet, Place 1 tablet (0.4 mg total) under the tongue every 5 (five) minutes as needed for chest pain (Up to 3 doses in 15 minutes.)., Disp: 30 tablet, Rfl: 2   sertraline (ZOLOFT) 100 MG tablet, Take 1 tablet by mouth daily., Disp: , Rfl:   Cardiovascular studies:  EKG 04/25/2022: Sinus rhythm 75 bpm Borderline left atrial enlargement Two PVCs  Mobile cardiac telemetry 14 days 01/21/2023 - 02/04/2023: Dominant rhythm: Sinus. HR 49-85 bpm. Avg HR 84 bpm. 0 episodes of SVT. <1% isolated SVE, couplets. 900 episodes of VT, fastest at 203 bpm for 4 beats, longest for 5 beats at 128 bpm. 25.4% isolated VE, 12.4% couplet, 4.2% triplets. No atrial fibrillation/atrial flutter/SVT/high grade AV block, sinus pause >3sec noted. 9 patient triggered events, correlated with VE with or without SVE.  Echocardiogram 01/21/2023: Normal LV systolic function with visual EF 55-60%. Left ventricle cavity is normal in size. Normal left ventricular wall thickness. Normal global wall motion.  Normal diastolic filling pattern, normal LAP. Calculated EF 57%. Left atrial cavity is slightly dilated. Structurally normal tricuspid valve with no regurgitation. No evidence of pulmonary hypertension. No significant change compared to 11/2021.   Coronary angiography 01/05/2019: LM: Normal LAD: Prox 50% focal stenosis. Mid 60-70% diffuse disease        Culprit artery D2 with ostial 90% and lateral branch ostial 75% stenosis        Successful balloon angioplasty with 20% residual stenosis. LCx: Normal RCA: RPDA 40% disease Normal LVEDP Normal LVEF   Recommendation: Aggressive medical management. DAPT for 1 year High intensity statin Lipitor 40 mg, metoprolol 25 mg bid. Diet and lifestyle modifications, cardiac rehab   Recent labs: 11/14/2022: Glucose 116, BUN/Cr 17/1.29. EGFR 60. Na/K 136/4.4.  Chol 158, TG 76, HDL 54, LDL 89  08/23/2021: Glucose 101, BUN/Cr 19/1.23. EGFR 65. Na/K 140/4.6. Rest of the CMP normal H/H 16/47. MCV 90. Platelets 199 Chol 101, TG 41, HDL 54, LDL 36  Review of Systems  Cardiovascular:  Positive for dyspnea on exertion and palpitations. Negative for chest pain, leg swelling and syncope.         There were no vitals filed for this visit.   Objective:   Physical Exam Vitals and nursing note reviewed.  Constitutional:      General: He is not in acute distress. Neck:     Vascular: No JVD.  Cardiovascular:     Rate and  Rhythm: Normal rate and regular rhythm. Frequent Extrasystoles are present.    Pulses: Intact distal pulses.     Heart sounds: Normal heart sounds. No murmur heard. Pulmonary:     Effort: Pulmonary effort is normal.     Breath sounds: Normal breath sounds. No wheezing or rales.  Musculoskeletal:     Right lower leg: No edema.     Left lower leg: No edema.           Assessment & Recommendations:   67 y/o Caucasian male with hypertension, CAD, PVC  Frequent PVC's: 25% PVC burden, short acting VT up to 5  beats. Symptomatic with palpitations, shortness of breath. Preserved EF on echocardiogram. Side effects to diltiazem due to dizziness, fatigue. Recommend metoprolol succinate 25 mg daily. In addition, referred to EP for consideration for AAD versus ablation.  CAD: Currently no angina symptoms. NSTEMI in 01/2019 with culprit vessel ostial diagonal treated with balloon angioplasty. He has residual disease in nonculprit pLAD. Lexiscan nuclear stress test 02/2019 with no ischemia/infarction. Continue Aspirin, lipitor 80 mg daily It started Lipitor along with diltiazem as well.  Emphasized importance of compliance with statin.  Hypertension: Blood pressure elevated today.  Hopefully addition of metoprolol succinate will help.    F/u in 3 months     Elder Negus, MD Pager: [phone] Office: [phone]

## 2023-04-15 DIAGNOSIS — Z125 Encounter for screening for malignant neoplasm of prostate: Secondary | ICD-10-CM | POA: Diagnosis not present

## 2023-04-15 DIAGNOSIS — Z0000 Encounter for general adult medical examination without abnormal findings: Secondary | ICD-10-CM | POA: Diagnosis not present

## 2023-04-15 DIAGNOSIS — F339 Major depressive disorder, recurrent, unspecified: Secondary | ICD-10-CM | POA: Diagnosis not present

## 2023-04-15 DIAGNOSIS — Z13220 Encounter for screening for lipoid disorders: Secondary | ICD-10-CM | POA: Diagnosis not present

## 2023-04-15 DIAGNOSIS — R972 Elevated prostate specific antigen [PSA]: Secondary | ICD-10-CM | POA: Diagnosis not present

## 2023-04-15 DIAGNOSIS — I493 Ventricular premature depolarization: Secondary | ICD-10-CM | POA: Diagnosis not present

## 2023-04-17 ENCOUNTER — Encounter: Payer: Self-pay | Admitting: Internal Medicine

## 2023-04-17 ENCOUNTER — Ambulatory Visit: Payer: 59 | Attending: Internal Medicine | Admitting: Internal Medicine

## 2023-04-17 VITALS — BP 130/68 | HR 84 | Ht 71.0 in | Wt 222.0 lb

## 2023-04-17 DIAGNOSIS — I493 Ventricular premature depolarization: Secondary | ICD-10-CM | POA: Diagnosis not present

## 2023-04-17 MED ORDER — MEXILETINE HCL 150 MG PO CAPS: 150.0000 mg | ORAL_CAPSULE | Freq: Two times a day (BID) | ORAL | 3 refills | Status: DC

## 2023-04-17 NOTE — Patient Instructions (Addendum)
Medication Instructions:  Your physician has recommended you make the following change in your medication:  START mexiletine 150 mg twice a day.  *If you need a refill on your cardiac medications before your next appointment, please call your pharmacy*  Lab Work: None ordered.  If you have labs (blood work) drawn today and your tests are completely normal, you will receive your results only by: MyChart Message (if you have MyChart) OR A paper copy in the mail If you have any lab test that is abnormal or we need to change your treatment, we will call you to review the results.  Testing/Procedures: None ordered.  Follow-Up: At [facility], you and your health needs are our priority.  As part of our continuing mission to provide you with exceptional heart care, we have created designated Provider Care Teams.  These Care Teams include your primary Cardiologist (physician) and Advanced Practice Providers (APPs -  Physician Assistants and Nurse Practitioners) who all work together to provide you with the care you need, when you need it.  To learn more about what you can do with MyChart, go to [email or web address].    Your next appointment:   3 weeks for a EKG 3 month follow up  The format for your next appointment:   In Person  Provider:   Lewayne Bunting, MD{or one of the following Advanced Practice Providers on your designated Care Team:   Francis Dowse, [place] Casimiro Needle "Mardelle Matte" [place], [place] Earnest Rosier, NP   Important Information About Sugar

## 2023-04-17 NOTE — Progress Notes (Signed)
HPI Shane Kerr is referred by Dr. Rosemary Holms for evaluation of PVC's. He has a h/o prior MI, and HTN. He has undergone left heart cath with 2 vessel CAD and preserved LV function. The patient has not had syncope. He feels palpitations at time and notes at times that his HR is slow with pulse of 40/min. He has had documented ventricular bigeminy and his heart monitor showed over 25% PVC's. His ECG shows that he has at least 2 different PVC morphologies.  Allergies  Allergen Reactions   Other     Other reaction(s): stomach cramps   Sulfur    Sulfa Antibiotics Nausea And Vomiting     Current Outpatient Medications  Medication Sig Dispense Refill   ASPIRIN LOW DOSE 81 MG EC tablet TAKE 1 TABLET BY MOUTH EVERY DAY 90 tablet 2   atorvastatin (LIPITOR) 80 MG tablet TAKE 1 TABLET BY MOUTH DAILY AT 6 PM. 90 tablet 3   metoprolol succinate (TOPROL-XL) 25 MG 24 hr tablet Take 1 tablet (25 mg total) by mouth daily. 90 tablet 3   mexiletine (MEXITIL) 150 MG capsule Take 1 capsule (150 mg total) by mouth 2 (two) times daily. 90 capsule 3   nitroGLYCERIN (NITROSTAT) 0.4 MG SL tablet Place 1 tablet (0.4 mg total) under the tongue every 5 (five) minutes as needed for chest pain (Up to 3 doses in 15 minutes.). 30 tablet 2   sertraline (ZOLOFT) 100 MG tablet Take 1 tablet by mouth daily.     No current facility-administered medications for this visit.     Past Medical History:  Diagnosis Date   Coronary artery disease    Depression    HTN (hypertension) 01/14/2019   Hyperlipemia 01/14/2019   NSTEMI (non-ST elevated myocardial infarction) (HCC)     ROS:   All systems reviewed and negative except as noted in the HPI.   Past Surgical History:  Procedure Laterality Date   BACK SURGERY     CARDIAC CATHETERIZATION     CORONARY BALLOON ANGIOPLASTY N/A 01/05/2019   Procedure: CORONARY BALLOON ANGIOPLASTY;  Surgeon: Elder Negus, MD;  Location: [facility];  Service:  Cardiovascular;  Laterality: N/A;   KNEE ARTHROSCOPY Left    LEFT HEART CATH AND CORONARY ANGIOGRAPHY N/A 01/05/2019   Procedure: LEFT HEART CATH AND CORONARY ANGIOGRAPHY;  Surgeon: Elder Negus, MD;  Location: [facility] INVAS[facility] CV LAB;  Service: Cardiovascular;  Laterality: N/A;   LUMBAR LAMINECTOMY/DECOMPRESSION MICRODISCECTOMY Left 07/03/2018   Procedure: Left Lumbar five Sacral one Microdiscectomy;  Surgeon: Maeola Harman, MD;  Location: [facility] OR;  Service: Neurosurgery;  Laterality: Left;   LUMBAR MICRODISCECTOMY Left 07/03/2018   Left Lumbar five Sacral one Microdiscectomy   TONSILLECTOMY  1960s   TRIGGER FINGER RELEASE Right 06/2014   "middle finger"     Family History  Problem Relation Age of Onset   Heart disease Mother    Arthritis Mother    Diabetes Father      Social History   Socioeconomic History   Marital status: Married    Spouse name: Shane Kerr   Number of children: 1   Years of education: 16   Highest education level: Not on file  Occupational History    Comment: city of [place]   Tobacco Use   Smoking status: Never   Smokeless tobacco: Never  Vaping Use   Vaping status: Never Used  Substance and Sexual Activity   Alcohol use: Yes    Comment: 07/03/2018 "might have  1 beer/month; if that"   Drug use: Never   Sexual activity: Not Currently  Other Topics Concern   Not on file  Social History Narrative   Lives with wife who he reports is a retired Materials engineer   Social Determinants of Health   Financial Resource Strain: Low Risk  (05/12/2021)   Overall Financial Resource Strain (CARDIA)    Difficulty of Paying Living Expenses: Not hard at all  Food Insecurity: No Food Insecurity (05/12/2021)   Hunger Vital Sign    Worried About Running Out of Food in the Last Year: Never true    Ran Out of Food in the Last Year: Never true  Transportation Needs: No Transportation Needs (05/12/2021)   PRAPARE - Administrator, Civil Service (Medical): No    Lack of  Transportation (Non-Medical): No  Physical Activity: Inactive (04/07/2019)   Exercise Vital Sign    Days of Exercise per Week: 0 days    Minutes of Exercise per Session: 0 min  Stress: Stress Concern Present (04/07/2019)   [employer] of Occupational Health - Occupational Stress Questionnaire    Feeling of Stress : Rather much  Social Connections: Unknown (05/12/2021)   Social Connection and Isolation Panel [NHANES]    Frequency of Communication with Friends and Family: More than three times a week    Frequency of Social Gatherings with Friends and Family: More than three times a week    Attends Religious Services: Patient declined    Database administrator or Organizations: Patient declined    Attends Banker Meetings: Patient declined    Marital Status: Married  Catering manager Violence: Not At Risk (05/12/2021)   Humiliation, Afraid, Rape, and Kick questionnaire    Fear of Current or Ex-Partner: No    Emotionally Abused: No    Physically Abused: No    Sexually Abused: No     BP 130/68   Pulse 84   Ht 5\' 11"  (1.803 m)   Wt 222 lb (100.7 kg)   SpO2 97%   BMI 30.96 kg/m   Physical Exam:  Well appearing NAD HEENT: Unremarkable Neck:  No JVD, no thyromegally Lymphatics:  No adenopathy Back:  No CVA tenderness Lungs:  Clear with no wheezes HEART:  Regular rate rhythm, no murmurs, no rubs, no clicks Abd:  soft, positive bowel sounds, no organomegally, no rebound, no guarding Ext:  2 plus pulses, no edema, no cyanosis, no clubbing Skin:  No rashes no nodules Neuro:  CN II through XII intact, motor grossly intact    Assess/Plan: PVC's - he has 2 different morphologies on his heart monitor. I have discussed the treatment options with the patient. His heart monitor demonstrated over 25% PVC's. I have recommended a trial of medical therapy as he has more than one PVC morphology. Mexiletine has been prescribed. He will return for a 12 lead ECG in the coming weeks.   CAD - he is s/p MI with preserved LV function. He will continue his current meds.  Shane Gowda Dezaria Methot,MD

## 2023-05-02 ENCOUNTER — Ambulatory Visit: Payer: 59 | Admitting: Cardiovascular Disease

## 2023-05-08 ENCOUNTER — Ambulatory Visit: Payer: 59 | Attending: Internal Medicine

## 2023-05-08 VITALS — HR 77 | Ht 71.0 in | Wt 228.2 lb

## 2023-05-08 DIAGNOSIS — I493 Ventricular premature depolarization: Secondary | ICD-10-CM | POA: Diagnosis not present

## 2023-05-08 NOTE — Progress Notes (Signed)
   Nurse Visit   Date of Encounter: 05/08/2023 ID: Shane Kerr, DOB [birth year], MRN [id number]  PCP:  Joycelyn Rua, MD   [facility] Providers Cardiologist:  None { Click to update primary MD,subspecialty MD or APP then REFRESH:1}     Visit Details   VS:  There were no vitals taken for this visit. , BMI There is no height or weight on file to calculate BMI.  Wt Readings from Last 3 Encounters:  04/17/23 222 lb (100.7 kg)  02/27/23 226 lb (102.5 kg)  10/24/22 225 lb (102.1 kg)     Reason for visit: EKG Performed today: Vitals, EKG, Provider consulted:Dr. Tenny Craw, and Education Changes (medications, testing, etc.) :None Length of Visit: 20 minutes  Pt reports feels okay on Mexiletine 150 mg PO BID.  Does report some lightheadedness but nothing of concern.    Medications Adjustments/Labs and Tests Ordered: No orders of the defined types were placed in this encounter.  No orders of the defined types were placed in this encounter.    Signed, Macie Burows, RN  05/08/2023 11:05 AM

## 2023-05-08 NOTE — Patient Instructions (Signed)
Medication Instructions:  Your physician recommends that you continue on your current medications as directed. Please refer to the Current Medication list given to you today.  *If you need a refill on your cardiac medications before your next appointment, please call your pharmacy*   Lab Work: NONE If you have labs (blood work) drawn today and your tests are completely normal, you will receive your results only by: MyChart Message (if you have MyChart) OR A paper copy in the mail If you have any lab test that is abnormal or we need to change your treatment, we will call you to review the results.   Testing/Procedures: NONE   Follow-Up:As Scheduled At [facility], you and your health needs are our priority.  As part of our continuing mission to provide you with exceptional heart care, we have created designated Provider Care Teams.  These Care Teams include your primary Cardiologist (physician) and Advanced Practice Providers (APPs -  Physician Assistants and Nurse Practitioners) who all work together to provide you with the care you need, when you need it.

## 2023-05-30 ENCOUNTER — Ambulatory Visit: Payer: 59 | Admitting: Cardiology

## 2023-06-10 DIAGNOSIS — N400 Enlarged prostate without lower urinary tract symptoms: Secondary | ICD-10-CM | POA: Diagnosis not present

## 2023-06-10 DIAGNOSIS — R972 Elevated prostate specific antigen [PSA]: Secondary | ICD-10-CM | POA: Diagnosis not present

## 2023-06-11 ENCOUNTER — Other Ambulatory Visit: Payer: Self-pay | Admitting: Urology

## 2023-06-11 DIAGNOSIS — Z8042 Family history of malignant neoplasm of prostate: Secondary | ICD-10-CM

## 2023-06-11 DIAGNOSIS — R972 Elevated prostate specific antigen [PSA]: Secondary | ICD-10-CM

## 2023-07-31 ENCOUNTER — Ambulatory Visit (INDEPENDENT_AMBULATORY_CARE_PROVIDER_SITE_OTHER): Payer: 59

## 2023-07-31 ENCOUNTER — Encounter: Payer: Self-pay | Admitting: Internal Medicine

## 2023-07-31 ENCOUNTER — Ambulatory Visit: Payer: 59 | Attending: Internal Medicine | Admitting: Internal Medicine

## 2023-07-31 VITALS — BP 130/84 | HR 76 | Ht 71.0 in | Wt 230.0 lb

## 2023-07-31 DIAGNOSIS — I493 Ventricular premature depolarization: Secondary | ICD-10-CM

## 2023-07-31 NOTE — Patient Instructions (Addendum)
Medication Instructions:  Your physician recommends that you continue on your current medications as directed. Please refer to the Current Medication list given to you today.  *If you need a refill on your cardiac medications before your next appointment, please call your pharmacy*  Lab Work: None ordered.  If you have labs (blood work) drawn today and your tests are completely normal, you will receive your results only by: MyChart Message (if you have MyChart) OR A paper copy in the mail If you have any lab test that is abnormal or we need to change your treatment, we will call you to review the results.  Testing/Procedures: Your physician has requested that you wear a Zio heart monitor for 3 days. This will be mailed to your home with instructions on how to apply the monitor and how to return it when finished. Please allow 2 weeks after returning the heart monitor before our office calls you with the results.   ZIO XT- Long Term Monitor Instructions     Your physician has requested you wear a ZIO patch monitor for 3 days.  This is a single patch monitor. [employer] supplies one patch monitor per enrollment. Additional  stickers are not available. Please do not apply patch if you will be having a Nuclear Stress Test,  Echocardiogram, Cardiac CT, MRI, or Chest Xray during the period you would be wearing the  monitor. The patch cannot be worn during these tests. You cannot remove and re-apply the  ZIO XT patch monitor.  Your ZIO patch monitor will be mailed 3 day USPS to your address on file. It may take 3-5 days  to receive your monitor after you have been enrolled.  Once you have received your monitor, please review the enclosed instructions. Your monitor  has already been registered assigning a specific monitor serial # to you.     Billing and Patient Assistance Program Information     We have supplied [employer] with any of your insurance information on file for billing purposes.   [employer] offers a sliding scale Patient Assistance Program for patients that do not have  insurance, or whose insurance does not completely cover the cost of the ZIO monitor.  You must apply for the Patient Assistance Program to qualify for this discounted rate.  To apply, please call [employer] at [phone], select option 4, select option 2, ask to apply for  Patient Assistance Program. Meredeth Ide will ask your household income, and how many people  are in your household. They will quote your out-of-pocket cost based on that information.  [employer] will also be able to set up a 35-month, interest-free payment plan if needed.     Applying the monitor     Shave hair from upper left chest.  Hold abrader disc by orange tab. Rub abrader in 40 strokes over the upper left chest as  indicated in your monitor instructions.  Clean area with 4 enclosed alcohol pads. Let dry.  Apply patch as indicated in monitor instructions. Patch will be placed under collarbone on left  side of chest with arrow pointing upward.  Rub patch adhesive wings for 2 minutes. Remove white label marked "1". Remove the white  label marked "2". Rub patch adhesive wings for 2 additional minutes.  While looking in a mirror, press and release button in center of patch. A small green light will  flash 3-4 times. This will be your only indicator that the monitor has been turned on.  Do not shower for the  first 24 hours. You may shower after the first 24 hours.  Press the button if you feel a symptom. You will hear a small click. Record Date, Time and  Symptom in the Patient Logbook.  When you are ready to remove the patch, follow instructions on the last 2 pages of Patient  Logbook. Stick patch monitor onto the last page of Patient Logbook.  Place Patient Logbook in the blue and white box. Use locking tab on box and tape box closed  securely. The blue and white box has prepaid postage on it. Please place it in the mailbox as  soon as  possible. Your physician should have your test results approximately 7 days after the  monitor has been mailed back to [facility]  Call [facility] Customer Care at [phone] if you have questions regarding  your ZIO XT patch monitor. Call them immediately if you see an orange light blinking on your  monitor.  If your monitor falls off in less than 4 days, contact our Monitor department at [phone].  If your monitor becomes loose or falls off after 4 days call [employer] at [phone] for  suggestions on securing your monitor.    Follow-Up: At [facility], you and your health needs are our priority.  As part of our continuing mission to provide you with exceptional heart care, we have created designated Provider Care Teams.  These Care Teams include your primary Cardiologist (physician) and Advanced Practice Providers (APPs -  Physician Assistants and Nurse Practitioners) who all work together to provide you with the care you need, when you need it.   Your next appointment:   To be based on testing  The format for your next appointment:   In Person  Provider:   Lewayne Bunting, MD{or one of the following Advanced Practice Providers on your designated Care Team:   Francis Dowse, [place] Casimiro Needle "Mardelle Matte" [place], [place] Earnest Rosier, NP   Important Information About Sugar

## 2023-07-31 NOTE — Progress Notes (Unsigned)
Enrolled patient for a 3 day Zio XT monitor to be mailed to patients home  

## 2023-07-31 NOTE — Progress Notes (Signed)
HPI Mr. Henningsen is referred by Dr. Rosemary Holms for evaluation of PVC's. He has a h/o prior MI, and HTN. He has undergone left heart cath with 2 vessel CAD and preserved LV function. The patient has not had syncope. He feels palpitations at time and notes at times that his HR is slow with pulse of 40/min. He has had documented ventricular bigeminy and his heart monitor showed over 25% PVC's. His ECG shows that he has at least 2 different PVC morphologies. When I saw him last he was placed on mexitil. He feels better but still has some palpitations.  Allergies  Allergen Reactions   Other     Other reaction(s): stomach cramps   Sulfur    Sulfa Antibiotics Nausea And Vomiting     Current Outpatient Medications  Medication Sig Dispense Refill   ASPIRIN LOW DOSE 81 MG EC tablet TAKE 1 TABLET BY MOUTH EVERY DAY 90 tablet 2   atorvastatin (LIPITOR) 80 MG tablet TAKE 1 TABLET BY MOUTH DAILY AT 6 PM. 90 tablet 3   metoprolol succinate (TOPROL-XL) 25 MG 24 hr tablet Take 1 tablet (25 mg total) by mouth daily. 90 tablet 3   mexiletine (MEXITIL) 150 MG capsule Take 1 capsule (150 mg total) by mouth 2 (two) times daily. 90 capsule 3   nitroGLYCERIN (NITROSTAT) 0.4 MG SL tablet Place 1 tablet (0.4 mg total) under the tongue every 5 (five) minutes as needed for chest pain (Up to 3 doses in 15 minutes.). 30 tablet 2   sertraline (ZOLOFT) 100 MG tablet Take 1 tablet by mouth daily.     No current facility-administered medications for this visit.     Past Medical History:  Diagnosis Date   Coronary artery disease    Depression    HTN (hypertension) 01/14/2019   Hyperlipemia 01/14/2019   NSTEMI (non-ST elevated myocardial infarction) (HCC)     ROS:   All systems reviewed and negative except as noted in the HPI.   Past Surgical History:  Procedure Laterality Date   BACK SURGERY     CARDIAC CATHETERIZATION     CORONARY BALLOON ANGIOPLASTY N/A 01/05/2019   Procedure: CORONARY BALLOON  ANGIOPLASTY;  Surgeon: Elder Negus, MD;  Location: [facility] INVASIVE [facility] LAB;  Service: Cardiovascular;  Laterality: N/A;   KNEE ARTHROSCOPY Left    LEFT HEART CATH AND CORONARY ANGIOGRAPHY N/A 01/05/2019   Procedure: LEFT HEART CATH AND CORONARY ANGIOGRAPHY;  Surgeon: Elder Negus, MD;  Location: [facility];  Service: Cardiovascular;  Laterality: N/A;   LUMBAR LAMINECTOMY/DECOMPRESSION MICRODISCECTOMY Left 07/03/2018   Procedure: Left Lumbar five Sacral one Microdiscectomy;  Surgeon: Maeola Harman, MD;  Location: [facility] OR;  Service: Neurosurgery;  Laterality: Left;   LUMBAR MICRODISCECTOMY Left 07/03/2018   Left Lumbar five Sacral one Microdiscectomy   TONSILLECTOMY  1960s   TRIGGER FINGER RELEASE Right 06/2014   "middle finger"     Family History  Problem Relation Age of Onset   Heart disease Mother    Arthritis Mother    Diabetes Father      Social History   Socioeconomic History   Marital status: Married    Spouse name: [name]   Number of children: 1   Years of education: 16   Highest education level: Not on file  Occupational History    Comment: city of [place]   Tobacco Use   Smoking status: Never   Smokeless tobacco: Never  Vaping Use   Vaping status: Never  Used  Substance and Sexual Activity   Alcohol use: Yes    Comment: 07/03/2018 "might have 1 beer/month; if that"   Drug use: Never   Sexual activity: Not Currently  Other Topics Concern   Not on file  Social History Narrative   Lives with wife who he reports is a retired Materials engineer   Social Determinants of Corporate investment banker Strain: Low Risk  (05/12/2021)   Overall Financial Resource Strain (CARDIA)    Difficulty of Paying Living Expenses: Not hard at all  Food Insecurity: No Food Insecurity (05/12/2021)   Hunger Vital Sign    Worried About Running Out of Food in the Last Year: Never true    Ran Out of Food in the Last Year: Never true  Transportation Needs: No Transportation Needs  (05/12/2021)   PRAPARE - Administrator, Civil Service (Medical): No    Lack of Transportation (Non-Medical): No  Physical Activity: Inactive (04/07/2019)   Exercise Vital Sign    Days of Exercise per Week: 0 days    Minutes of Exercise per Session: 0 min  Stress: Stress Concern Present (04/07/2019)   [employer] of Occupational Health - Occupational Stress Questionnaire    Feeling of Stress : Rather much  Social Connections: Unknown (05/12/2021)   Social Connection and Isolation Panel [NHANES]    Frequency of Communication with Friends and Family: More than three times a week    Frequency of Social Gatherings with Friends and Family: More than three times a week    Attends Religious Services: Patient declined    Database administrator or Organizations: Patient declined    Attends Banker Meetings: Patient declined    Marital Status: Married  Catering manager Violence: Not At Risk (05/12/2021)   Humiliation, Afraid, Rape, and Kick questionnaire    Fear of Current or Ex-Partner: No    Emotionally Abused: No    Physically Abused: No    Sexually Abused: No     BP 130/84   Pulse 76   Ht 5\' 11"  (1.803 m)   Wt 230 lb (104.3 kg)   SpO2 96%   BMI 32.08 kg/m   Physical Exam:  Well appearing NAD HEENT: Unremarkable Neck:  No JVD, no thyromegally Lymphatics:  No adenopathy Back:  No CVA tenderness Lungs:  Clear with no wheezes HEART:  Regular rate rhythm, no murmurs, no rubs, no clicks Abd:  soft, positive bowel sounds, no organomegally, no rebound, no guarding Ext:  2 plus pulses, no edema, no cyanosis, no clubbing Skin:  No rashes no nodules Neuro:  CN II through XII intact, motor grossly intact   Assess/Plan: PVC's - we discussed the mechanism and what to expect. I asked him to wear a 3 day zio to confirm that his PVC burden is improved.  Dyslipidemia - he will continue atorvastatin.   Sharlot Gowda Bereket Gernert,MD

## 2023-08-14 DIAGNOSIS — I493 Ventricular premature depolarization: Secondary | ICD-10-CM

## 2023-08-30 DIAGNOSIS — I493 Ventricular premature depolarization: Secondary | ICD-10-CM | POA: Diagnosis not present

## 2023-09-24 ENCOUNTER — Telehealth: Payer: Self-pay | Admitting: Internal Medicine

## 2023-09-24 NOTE — Telephone Encounter (Signed)
Spoke with Pt. Monitor results given. Pt set up with appt on 10/28/23.

## 2023-09-24 NOTE — Telephone Encounter (Signed)
Patient returned call for his monitor results.  

## 2023-10-23 ENCOUNTER — Other Ambulatory Visit: Payer: Self-pay | Admitting: Internal Medicine

## 2023-10-28 ENCOUNTER — Ambulatory Visit: Payer: 59 | Admitting: Internal Medicine

## 2023-11-18 DIAGNOSIS — N400 Enlarged prostate without lower urinary tract symptoms: Secondary | ICD-10-CM | POA: Diagnosis not present

## 2023-11-25 DIAGNOSIS — R972 Elevated prostate specific antigen [PSA]: Secondary | ICD-10-CM | POA: Diagnosis not present

## 2023-12-10 ENCOUNTER — Ambulatory Visit: Payer: 59 | Admitting: Internal Medicine

## 2023-12-20 ENCOUNTER — Ambulatory Visit: Attending: Internal Medicine | Admitting: Internal Medicine

## 2023-12-20 ENCOUNTER — Encounter: Payer: Self-pay | Admitting: Internal Medicine

## 2023-12-20 VITALS — BP 134/86 | HR 75 | Ht 71.0 in | Wt 230.2 lb

## 2023-12-20 DIAGNOSIS — I493 Ventricular premature depolarization: Secondary | ICD-10-CM | POA: Insufficient documentation

## 2023-12-20 NOTE — Patient Instructions (Addendum)
 Medication Instructions:  Your physician recommends that you continue on your current medications as directed. Please refer to the Current Medication list given to you today.  *If you need a refill on your cardiac medications before your next appointment, please call your pharmacy*  Lab Work: None ordered.  You may go to any [facility] Location for your lab work:  [facility] - [address] ([facility]) - [address] [phone] [address]  [place] - [address]   [place]  - [address]   [place] - [address] - [address] [employer]  - [address] ([facility]   If you have labs (blood work) drawn today and your tests are completely normal, you will receive your results only by: [employer] (if you have MyChart)  If you have any lab test that is abnormal or we need to change your treatment, we will call you or send a MyChart message to review the results.  Testing/Procedures: None ordered.  Follow-Up: At [facility], you and your health needs are our priority.  As part of our continuing mission to provide you with exceptional heart care, we have created designated Provider Care Teams.  These Care Teams include your primary Cardiologist (physician) and Advanced Practice Providers (APPs -  Physician Assistants and Nurse Practitioners) who all work together to provide you with the care you need, when you need it.  Your next appointment:   To be scheduled  The format for your next appointment:   In Person  Provider:   Manya Sells, MD{or one of the following Advanced Practice Providers on your designated Care Team:   Mertha Abrahams, [place] Bambi Lever "Jonelle Neri" [place], [place] Neda Balk, NP            Valet parking services will be available as well.

## 2023-12-20 NOTE — Progress Notes (Signed)
 HPI [name] is referred by Dr. Filiberto Hug for evaluation of PVC's. He has a h/o prior MI, and HTN. He has undergone left heart cath with 2 vessel CAD and preserved LV function. The patient has not had syncope. He feels palpitations at time and notes at times that his HR is slow with pulse of 40/min. He has had documented ventricular bigeminy and his heart monitor showed over 25% PVC's. His ECG shows that he has at least 2 different PVC morphologies. When I saw him last he was placed on mexitil.   Allergies  Allergen Reactions   Other     Other reaction(s): stomach cramps   Sulfur    Sulfa Antibiotics Nausea And Vomiting     Current Outpatient Medications  Medication Sig Dispense Refill   ASPIRIN  LOW DOSE 81 MG EC tablet TAKE 1 TABLET BY MOUTH EVERY DAY 90 tablet 2   metoprolol  succinate (TOPROL -XL) 25 MG 24 hr tablet Take 1 tablet (25 mg total) by mouth daily. 90 tablet 3   mexiletine (MEXITIL) 150 MG capsule TAKE 1 CAPSULE BY MOUTH TWICE A DAY 180 capsule 2   nitroGLYCERIN  (NITROSTAT ) 0.4 MG SL tablet Place 1 tablet (0.4 mg total) under the tongue every 5 (five) minutes as needed for chest pain (Up to 3 doses in 15 minutes.). 30 tablet 2   sertraline  (ZOLOFT ) 100 MG tablet Take 1 tablet by mouth daily.     atorvastatin  (LIPITOR ) 80 MG tablet TAKE 1 TABLET BY MOUTH DAILY AT 6 PM. (Patient not taking: Reported on 12/20/2023) 90 tablet 3   No current facility-administered medications for this visit.     Past Medical History:  Diagnosis Date   Coronary artery disease    Depression    HTN (hypertension) 01/14/2019   Hyperlipemia 01/14/2019   NSTEMI (non-ST elevated myocardial infarction) (HCC)     ROS:   All systems reviewed and negative except as noted in the HPI.   Past Surgical History:  Procedure Laterality Date   BACK SURGERY     CARDIAC CATHETERIZATION     CORONARY BALLOON ANGIOPLASTY N/A 01/05/2019   Procedure: CORONARY BALLOON ANGIOPLASTY;  Surgeon: Cody Das, MD;  [facility]: [facility];  Service: Cardiovascular;  Laterality: N/A;   KNEE ARTHROSCOPY Left    LEFT HEART CATH AND CORONARY ANGIOGRAPHY N/A 01/05/2019   Procedure: LEFT HEART CATH AND CORONARY ANGIOGRAPHY;  Surgeon: Cody Das, MD;  Location: [facility];  Service: Cardiovascular;  Laterality: N/A;   LUMBAR LAMINECTOMY/DECOMPRESSION MICRODISCECTOMY Left 07/03/2018   Procedure: Left Lumbar five Sacral one Microdiscectomy;  Surgeon: Manya Sells, MD;  Location: [facility] OR;  Service: Neurosurgery;  Laterality: Left;   LUMBAR MICRODISCECTOMY Left 07/03/2018   Left Lumbar five Sacral one Microdiscectomy   TONSILLECTOMY  1960s   TRIGGER FINGER RELEASE Right 06/2014   "middle finger"     Family History  Problem Relation Age of Onset   Heart disease Mother    Arthritis Mother    Diabetes Father      Social History   Socioeconomic History   Marital status: Married    Spouse name: [name]   Number of children: 1   Years of education: 16   Highest education level: Not on file  Occupational History    Comment: city of [place]   Tobacco Use   Smoking status: Never   Smokeless tobacco: Never  Vaping Use   Vaping status: Never Used  Substance and Sexual  Activity   Alcohol use: Yes    Comment: 07/03/2018 "might have 1 beer/month; if that"   Drug use: Never   Sexual activity: Not Currently  Other Topics Concern   Not on file  Social History Narrative   Lives with wife who he reports is a retired Materials engineer   Social Drivers of Corporate investment banker Strain: Low Risk  (05/12/2021)   Overall Financial Resource Strain (CARDIA)    Difficulty of Paying Living Expenses: Not hard at all  Food Insecurity: No Food Insecurity (05/12/2021)   Hunger Vital Sign    Worried About Running Out of Food in the Last Year: Never true    Ran Out of Food in the Last Year: Never true  Transportation Needs: No Transportation Needs (05/12/2021)   PRAPARE - Therapist, art (Medical): No    Lack of Transportation (Non-Medical): No  Physical Activity: Inactive (04/07/2019)   Exercise Vital Sign    Days of Exercise per Week: 0 days    Minutes of Exercise per Session: 0 min  Stress: Stress Concern Present (04/07/2019)   [employer] of Occupational Health - Occupational Stress Questionnaire    Feeling of Stress : Rather much  Social Connections: Unknown (05/12/2021)   Social Connection and Isolation Panel [NHANES]    Frequency of Communication with Friends and Family: More than three times a week    Frequency of Social Gatherings with Friends and Family: More than three times a week    Attends Religious Services: Patient declined    Database administrator or Organizations: Patient declined    Attends Banker Meetings: Patient declined    Marital Status: Married  Catering manager Violence: Not At Risk (05/12/2021)   Humiliation, Afraid, Rape, and Kick questionnaire    Fear of Current or Ex-Partner: No    Emotionally Abused: No    Physically Abused: No    Sexually Abused: No     BP 134/86   Pulse 75   Ht 5\' 11"  (1.803 m)   Wt 230 lb 3.2 oz (104.4 kg)   SpO2 96%   BMI 32.11 kg/m   Physical Exam:  Well appearing NAD HEENT: Unremarkable Neck:  No JVD, no thyromegally Lymphatics:  No adenopathy Back:  No CVA tenderness Lungs:  Clear HEART:  Regular rate rhythm, no murmurs, no rubs, no clicks Abd:  soft, positive bowel sounds, no organomegally, no rebound, no guarding Ext:  2 plus pulses, no edema, no cyanosis, no clubbing Skin:  No rashes no nodules Neuro:  CN II through XII intact, motor grossly intact  EKG  DEVICE  Normal device function.  See PaceArt for details.   Assess/Plan:  PVC's - we discussed the mechanism and what to expect. Because he has not improved with mexitil and we have no other good options. I have recommended pursuing PVC ablation. He appears to have one dominant outflow looking PVC. I  have discussed the indications for PVC ablation and he will let us  know if he wishes to procedure.   Yardley Beltran,MD Dyslipidemia - he will continue atorvastatin .    [name],MD

## 2023-12-25 ENCOUNTER — Telehealth: Payer: Self-pay | Admitting: Internal Medicine

## 2023-12-25 NOTE — Telephone Encounter (Signed)
 Patient calling in to schedule his ablation. Please advise

## 2023-12-26 NOTE — Telephone Encounter (Signed)
 Left message to call back

## 2024-01-07 ENCOUNTER — Telehealth: Payer: Self-pay | Admitting: Internal Medicine

## 2024-01-07 DIAGNOSIS — Z01812 Encounter for preprocedural laboratory examination: Secondary | ICD-10-CM

## 2024-01-07 DIAGNOSIS — I493 Ventricular premature depolarization: Secondary | ICD-10-CM

## 2024-01-07 NOTE — Telephone Encounter (Signed)
 Spoke with pt. Date sent and letter will be sent to his home.

## 2024-01-07 NOTE — Telephone Encounter (Signed)
 Pt calling to get a update on having a ablation done. Please advise

## 2024-01-20 LAB — CBC
Hematocrit: 47.1 % (ref 37.5–51.0)
Hemoglobin: 16.1 g/dL (ref 13.0–17.7)
MCH: 31.4 pg (ref 26.6–33.0)
MCHC: 34.2 g/dL (ref 31.5–35.7)
MCV: 92 fL (ref 79–97)
Platelets: 216 10*3/uL (ref 150–450)
RBC: 5.12 x10E6/uL (ref 4.14–5.80)
RDW: 12.6 % (ref 11.6–15.4)
WBC: 7.8 10*3/uL (ref 3.4–10.8)

## 2024-01-20 LAB — BASIC METABOLIC PANEL WITH GFR
BUN/Creatinine Ratio: 19 (ref 10–24)
BUN: 21 mg/dL (ref 8–27)
CO2: 19 mmol/L — ABNORMAL LOW (ref 20–29)
Calcium: 9.2 mg/dL (ref 8.6–10.2)
Chloride: 104 mmol/L (ref 96–106)
Creatinine, Ser: 1.13 mg/dL (ref 0.76–1.27)
Glucose: 113 mg/dL — ABNORMAL HIGH (ref 70–99)
Potassium: 4.5 mmol/L (ref 3.5–5.2)
Sodium: 139 mmol/L (ref 134–144)
eGFR: 70 mL/min/{1.73_m2} (ref >59)

## 2024-02-12 ENCOUNTER — Other Ambulatory Visit (HOSPITAL_COMMUNITY): Payer: Self-pay

## 2024-02-12 ENCOUNTER — Telehealth: Payer: Self-pay

## 2024-02-12 DIAGNOSIS — I2510 Atherosclerotic heart disease of native coronary artery without angina pectoris: Secondary | ICD-10-CM

## 2024-02-12 MED ORDER — NITROGLYCERIN 0.4 MG SL SUBL
0.4000 mg | SUBLINGUAL_TABLET | SUBLINGUAL | 2 refills | Status: AC | PRN
Start: 2024-02-12 — End: ?
  Filled 2024-02-12: qty 25, 30d supply, fill #0

## 2024-02-12 NOTE — Telephone Encounter (Signed)
 Spoke with patient to complete pre-procedure call.     New medical conditions?  no Recent hospitalizations or surgeries? no Started any new medications? no Patient made aware to contact office to inform of any new medications started. Any changes in activities of daily living? no  Pre-procedure testing scheduled: Lab work on Monday, June 23  Confirmed patient is scheduled for PVC Ablation  on Wednesday, July 2 with Dr. Manya Sells. Instructed patient to arrive at the Main Entrance A at [facility]: [address] [place], [place] [address] and check in at Admitting at 8:30 AM/PM  Advised of plan to go home the same day and will only stay overnight if medically necessary. You MUST have a responsible adult to drive you home and MUST be with you the first 24 hours after you arrive home or your procedure could be cancelled.  Patient verbalized understanding to information provided and is agreeable to proceed with procedure.    Patient requested refill on Nitroglycerin .

## 2024-02-17 ENCOUNTER — Telehealth: Payer: Self-pay | Admitting: Internal Medicine

## 2024-02-17 NOTE — Telephone Encounter (Signed)
 Spoke with the patient and advised on typical restrictions after an ablation.  - No driving for 3 days, no lifting over 10 lbs for 5 days, and no work or strenuous activity for 7 days.

## 2024-02-17 NOTE — Telephone Encounter (Signed)
 Patient would like to review any post-op instructions + restrictions for 7/02 ablation with Dr. Carolynne Citron.

## 2024-02-20 ENCOUNTER — Other Ambulatory Visit: Payer: Self-pay | Admitting: Cardiology

## 2024-02-24 ENCOUNTER — Other Ambulatory Visit: Payer: Self-pay

## 2024-02-24 ENCOUNTER — Other Ambulatory Visit (HOSPITAL_COMMUNITY): Payer: Self-pay

## 2024-02-24 DIAGNOSIS — I10 Essential (primary) hypertension: Secondary | ICD-10-CM

## 2024-02-24 DIAGNOSIS — I493 Ventricular premature depolarization: Secondary | ICD-10-CM

## 2024-02-24 DIAGNOSIS — Z01812 Encounter for preprocedural laboratory examination: Secondary | ICD-10-CM

## 2024-02-24 NOTE — Progress Notes (Signed)
 Phone call from lab stating pt has presented for lab but no orders are present.  CBC and BMET orders placed and released for [facility].

## 2024-02-25 ENCOUNTER — Ambulatory Visit: Payer: Self-pay

## 2024-02-25 LAB — BASIC METABOLIC PANEL WITH GFR
BUN/Creatinine Ratio: 11 (ref 10–24)
BUN: 15 mg/dL (ref 8–27)
CO2: 19 mmol/L — ABNORMAL LOW (ref 20–29)
Calcium: 9.7 mg/dL (ref 8.6–10.2)
Chloride: 102 mmol/L (ref 96–106)
Creatinine, Ser: 1.32 mg/dL — ABNORMAL HIGH (ref 0.76–1.27)
Glucose: 69 mg/dL — ABNORMAL LOW (ref 70–99)
Potassium: 4.8 mmol/L (ref 3.5–5.2)
Sodium: 137 mmol/L (ref 134–144)
eGFR: 58 mL/min/{1.73_m2} — ABNORMAL LOW (ref >59)

## 2024-02-25 LAB — CBC
Hematocrit: 49.1 % (ref 37.5–51.0)
Hemoglobin: 16.4 g/dL (ref 13.0–17.7)
MCH: 30.7 pg (ref 26.6–33.0)
MCHC: 33.4 g/dL (ref 31.5–35.7)
MCV: 92 fL (ref 79–97)
Platelets: 239 10*3/uL (ref 150–450)
RBC: 5.34 x10E6/uL (ref 4.14–5.80)
RDW: 13 % (ref 11.6–15.4)
WBC: 8.8 10*3/uL (ref 3.4–10.8)

## 2024-02-26 ENCOUNTER — Telehealth (HOSPITAL_COMMUNITY): Payer: Self-pay

## 2024-02-26 NOTE — Telephone Encounter (Signed)
 Spoke with patient to discuss upcoming procedure.   Labs: completed.   Any recent signs of acute illness or been started on antibiotics? No Any new medications started? No Any medications to hold? Hold Metoprolol  for 2 days prior to procedure- last dose on June 29, per Dr. Waddell. Medication instructions:  On the morning of your procedure or the procedure may be rescheduled. Nothing to eat or drink after midnight prior to your procedure.  Confirmed patient is scheduled for PVC Ablation on Wednesday, July 2 with Dr. Danelle Waddell. Instructed patient to arrive at the Main Entrance A at [facility]: [address] [place], [place] [address] and check in at Admitting at 9:30 AM   Advised of plan to go home the same day and will only stay overnight if medically necessary. You MUST have a responsible adult to drive you home and MUST be with you the first 24 hours after you arrive home or your procedure could be cancelled.  Patient verbalized understanding to all instructions provided and agreed to proceed with procedure.

## 2024-02-27 ENCOUNTER — Telehealth: Payer: Self-pay | Admitting: Internal Medicine

## 2024-02-27 DIAGNOSIS — I2510 Atherosclerotic heart disease of native coronary artery without angina pectoris: Secondary | ICD-10-CM

## 2024-02-27 MED ORDER — NITROGLYCERIN 0.4 MG SL SUBL: 0.4000 mg | SUBLINGUAL_TABLET | SUBLINGUAL | 0 refills | Status: AC | PRN

## 2024-02-27 NOTE — Telephone Encounter (Signed)
 Pt's medication was sent to pt's pharmacy as requested. Confirmation received.

## 2024-02-27 NOTE — Telephone Encounter (Signed)
*  STAT* If patient is at the pharmacy, call can be transferred to refill team.   1. Which medications need to be refilled? (please list name of each medication and dose if known)   nitroGLYCERIN  (NITROSTAT ) 0.4 MG SL tablet   DIFFERENT PHARMACY   4. Which pharmacy/location (including street and city if local pharmacy) is medication to be sent to?  [id number] [place], [place] - [address] Phone: [phone]  Fax: [phone]       5. Do they need a 30 day or 90 day supply? 90

## 2024-03-03 ENCOUNTER — Encounter (HOSPITAL_COMMUNITY): Payer: Self-pay | Admitting: Certified Registered Nurse Anesthetist

## 2024-03-03 NOTE — Pre-Procedure Instructions (Signed)
Attempted to call patient regarding procedure instructions.  Left voicemail on the following items: °Arrival time 0930 °Nothing to eat or drink after midnight °No meds AM of procedure °Responsible person to drive you home and stay with you for 24 hrs ° °   °

## 2024-03-04 ENCOUNTER — Encounter (HOSPITAL_COMMUNITY): Payer: Self-pay | Admitting: Certified Registered Nurse Anesthetist

## 2024-03-04 ENCOUNTER — Encounter (HOSPITAL_COMMUNITY)
Admission: RE | Disposition: A | Discharge status: Home / Self Care | Payer: Self-pay | Source: Home / Self Care | Attending: Internal Medicine

## 2024-03-04 ENCOUNTER — Ambulatory Visit (HOSPITAL_COMMUNITY)
Admission: RE | Admit: 2024-03-04 | Discharge: 2024-03-04 | Disposition: A | Discharge status: Home / Self Care | Attending: Internal Medicine | Admitting: Internal Medicine

## 2024-03-04 ENCOUNTER — Other Ambulatory Visit: Payer: Self-pay

## 2024-03-04 DIAGNOSIS — I2510 Atherosclerotic heart disease of native coronary artery without angina pectoris: Secondary | ICD-10-CM | POA: Diagnosis not present

## 2024-03-04 DIAGNOSIS — I493 Ventricular premature depolarization: Secondary | ICD-10-CM | POA: Diagnosis present

## 2024-03-04 DIAGNOSIS — I252 Old myocardial infarction: Secondary | ICD-10-CM | POA: Diagnosis not present

## 2024-03-04 DIAGNOSIS — Z79899 Other long term (current) drug therapy: Secondary | ICD-10-CM | POA: Insufficient documentation

## 2024-03-04 DIAGNOSIS — E785 Hyperlipidemia, unspecified: Secondary | ICD-10-CM | POA: Diagnosis not present

## 2024-03-04 DIAGNOSIS — I10 Essential (primary) hypertension: Secondary | ICD-10-CM | POA: Insufficient documentation

## 2024-03-04 HISTORY — PX: PVC ABLATION: EP1236

## 2024-03-04 SURGERY — PVC ABLATION
Anesthesia: LOCAL

## 2024-03-04 MED ORDER — ISOPROTERENOL HCL 0.2 MG/ML IJ SOLN
INTRAMUSCULAR | Status: AC
  Filled 2024-03-04: qty 5

## 2024-03-04 MED ORDER — SODIUM CHLORIDE 0.9% FLUSH
3.0000 mL | INTRAVENOUS | Status: DC | PRN
Start: 2024-03-04 — End: 2024-03-05

## 2024-03-04 MED ORDER — ISOPROTERENOL HCL 0.2 MG/ML IJ SOLN
INTRAMUSCULAR | Status: DC | PRN
  Administered 2024-03-04: 2 ug/min via INTRAVENOUS

## 2024-03-04 MED ORDER — LIDOCAINE HCL (PF) 1 % IJ SOLN
INTRAMUSCULAR | Status: DC | PRN
  Administered 2024-03-04: 30 mL

## 2024-03-04 MED ORDER — SODIUM CHLORIDE 0.9% FLUSH: 3.0000 mL | Freq: Two times a day (BID) | INTRAVENOUS | Status: DC

## 2024-03-04 MED ORDER — LIDOCAINE HCL (PF) 1 % IJ SOLN
INTRAMUSCULAR | Status: AC
  Filled 2024-03-04: qty 60

## 2024-03-04 MED ORDER — ACETAMINOPHEN 325 MG PO TABS: 650.0000 mg | ORAL_TABLET | ORAL | Status: DC | PRN

## 2024-03-04 MED ORDER — FENTANYL CITRATE (PF) 100 MCG/2ML IJ SOLN
INTRAMUSCULAR | Status: AC
  Filled 2024-03-04: qty 2

## 2024-03-04 MED ORDER — HEPARIN SODIUM (PORCINE) 1000 UNIT/ML IJ SOLN
INTRAMUSCULAR | Status: AC
  Filled 2024-03-04: qty 1

## 2024-03-04 MED ORDER — HEPARIN (PORCINE) IN NACL 1000-0.9 UT/500ML-% IV SOLN
INTRAVENOUS | Status: DC | PRN
  Administered 2024-03-04: 500 mL

## 2024-03-04 MED ORDER — ONDANSETRON HCL 4 MG/2ML IJ SOLN: 4.0000 mg | Freq: Four times a day (QID) | INTRAMUSCULAR | Status: DC | PRN

## 2024-03-04 MED ORDER — SODIUM CHLORIDE 0.9 % IV SOLN: 250.0000 mL | INTRAVENOUS | Status: DC | PRN

## 2024-03-04 MED ORDER — MIDAZOLAM HCL 5 MG/5ML IJ SOLN
INTRAMUSCULAR | Status: AC
  Filled 2024-03-04: qty 5

## 2024-03-04 MED ORDER — SODIUM CHLORIDE 0.9 % IV SOLN: INTRAVENOUS | Status: DC

## 2024-03-04 SURGICAL SUPPLY — 10 items
CATH JOSEPH QUAD ALLRED 6F REP (CATHETERS) IMPLANT
CATH SMTCH THERMOCOOL SF DF (CATHETERS) IMPLANT
MAT PREVALON FULL STRYKER (MISCELLANEOUS) IMPLANT
PACK EP LF (CUSTOM PROCEDURE TRAY) ×1 IMPLANT
PAD DEFIB RADIO PHYSIO CONN (PAD) ×1 IMPLANT
PATCH CARTO3 (PAD) IMPLANT
SHEATH PINNACLE 6F 10CM (SHEATH) IMPLANT
SHEATH PINNACLE 7F 10CM (SHEATH) IMPLANT
SHEATH PINNACLE 8F 10CM (SHEATH) IMPLANT
TUBING SMART ABLATE COOLFLOW (TUBING) IMPLANT

## 2024-03-04 NOTE — H&P (Signed)
 HPI [name] is referred by Dr. Elmira for evaluation of PVC's. He has a h/o prior MI, and HTN. He has undergone left heart cath with 2 vessel CAD and preserved LV function. The patient has not had syncope. He feels palpitations at time and notes at times that his HR is slow with pulse of 40/min. He has had documented ventricular bigeminy and his heart monitor showed over 25% PVC's. His ECG shows that he has at least 2 different PVC morphologies. When I saw him last he was placed on mexitil.   Allergies       Allergies  Allergen Reactions   Other        Other reaction(s): stomach cramps   Sulfur     Sulfa Antibiotics Nausea And Vomiting                Current Outpatient Medications  Medication Sig Dispense Refill   ASPIRIN  LOW DOSE 81 MG EC tablet TAKE 1 TABLET BY MOUTH EVERY DAY 90 tablet 2   metoprolol  succinate (TOPROL -XL) 25 MG 24 hr tablet Take 1 tablet (25 mg total) by mouth daily. 90 tablet 3   mexiletine (MEXITIL) 150 MG capsule TAKE 1 CAPSULE BY MOUTH TWICE A DAY 180 capsule 2   nitroGLYCERIN  (NITROSTAT ) 0.4 MG SL tablet Place 1 tablet (0.4 mg total) under the tongue every 5 (five) minutes as needed for chest pain (Up to 3 doses in 15 minutes.). 30 tablet 2   sertraline  (ZOLOFT ) 100 MG tablet Take 1 tablet by mouth daily.       atorvastatin  (LIPITOR ) 80 MG tablet TAKE 1 TABLET BY MOUTH DAILY AT 6 PM. (Patient not taking: Reported on 12/20/2023) 90 tablet 3      No current facility-administered medications for this visit.              Past Medical History:  Diagnosis Date   Coronary artery disease     Depression     HTN (hypertension) 01/14/2019   Hyperlipemia 01/14/2019   NSTEMI (non-ST elevated myocardial infarction) (HCC)            ROS:    All systems reviewed and negative except as noted in the HPI.          Past Surgical History:  Procedure Laterality Date   BACK SURGERY       CARDIAC CATHETERIZATION       CORONARY BALLOON ANGIOPLASTY  N/A 01/05/2019    Procedure: CORONARY BALLOON ANGIOPLASTY;  Surgeon: Shane Newman PARAS, MD;  Location: [facility];  Service: Cardiovascular;  Laterality: N/A;   KNEE ARTHROSCOPY Left     LEFT HEART CATH AND CORONARY ANGIOGRAPHY N/A 01/05/2019    Procedure: LEFT HEART CATH AND CORONARY ANGIOGRAPHY;  Surgeon: Shane Newman PARAS, MD;  Location: [facility];  Service: Cardiovascular;  Laterality: N/A;   LUMBAR LAMINECTOMY/DECOMPRESSION MICRODISCECTOMY Left 07/03/2018    Procedure: Left Lumbar five Sacral one Microdiscectomy;  Surgeon: Shane Pac, MD;  Location: [facility];  Service: Neurosurgery;  Laterality: Left;   LUMBAR MICRODISCECTOMY Left 07/03/2018    Left Lumbar five Sacral one Microdiscectomy   TONSILLECTOMY   1960s   TRIGGER FINGER RELEASE Right 06/2014    middle finger                 Family History  Problem Relation Age of Onset   Heart disease Mother     Arthritis Mother  Diabetes Father              Social History         Socioeconomic History   Marital status: Married      Spouse name: Shane Kerr   Number of children: 1   Years of education: 16   Highest education level: Not on file  Occupational History      Comment: city of    Tobacco Use   Smoking status: Never   Smokeless tobacco: Never  Vaping Use   Vaping status: Never Used  Substance and Sexual Activity   Alcohol use: Yes      Comment: 07/03/2018 might have 1 beer/month; if that   Drug use: Never   Sexual activity: Not Currently  Other Topics Concern   Not on file  Social History Narrative    Lives with wife who he reports is a retired Materials engineer    Social Drivers of Acupuncturist Strain: Low Risk  (05/12/2021)    Overall Financial Resource Strain (CARDIA)     Difficulty of Paying Living Expenses: Not hard at all  Food Insecurity: No Food Insecurity (05/12/2021)    Hunger Vital Sign     Worried About Running Out of Food in the Last Year: Never true      Ran Out of Food in the Last Year: Never true  Transportation Needs: No Transportation Needs (05/12/2021)    PRAPARE - Therapist, art (Medical): No     Lack of Transportation (Non-Medical): No  Physical Activity: Inactive (04/07/2019)    Exercise Vital Sign     Days of Exercise per Week: 0 days     Minutes of Exercise per Session: 0 min  Stress: Stress Concern Present (04/07/2019)    [employer] of Occupational Health - Occupational Stress Questionnaire     Feeling of Stress : Rather much  Social Connections: Unknown (05/12/2021)    Social Connection and Isolation Panel [NHANES]     Frequency of Communication with Friends and Family: More than three times a week     Frequency of Social Gatherings with Friends and Family: More than three times a week     Attends Religious Services: Patient declined     Database administrator or Organizations: Patient declined     Attends Banker Meetings: Patient declined     Marital Status: Married  Catering manager Violence: Not At Risk (05/12/2021)    Humiliation, Afraid, Rape, and Kick questionnaire     Fear of Current or Ex-Partner: No     Emotionally Abused: No     Physically Abused: No     Sexually Abused: No        BP 134/86   Pulse 75   Ht 5' 11 (1.803 m)   Wt 230 lb 3.2 oz (104.4 kg)   SpO2 96%   BMI 32.11 kg/m    Physical Exam:   Well appearing NAD HEENT: Unremarkable Neck:  No JVD, no thyromegally Lymphatics:  No adenopathy Back:  No CVA tenderness Lungs:  Clear HEART:  Regular rate rhythm, no murmurs, no rubs, no clicks Abd:  soft, positive bowel sounds, no organomegally, no rebound, no guarding Ext:  2 plus pulses, no edema, no cyanosis, no clubbing Skin:  No rashes no nodules Neuro:  CN II through XII intact, motor grossly intact   EKG   DEVICE  Normal device function.  See  PaceArt for details.    Assess/Plan:   PVC's - we discussed the mechanism and what to expect.  Because he has not improved with mexitil and we have no other good options. I have recommended pursuing PVC ablation. He appears to have one dominant outflow looking PVC. I have discussed the indications for PVC ablation and he will let us  know if he wishes to procedure.    Dyslipidemia - he will continue atorvastatin .    Shane Kerr COME

## 2024-03-04 NOTE — Discharge Instructions (Signed)

## 2024-03-04 NOTE — Progress Notes (Signed)
 Pt arrived from EP. All three venous sheaths pulled, pressure held, and sites dressed. No issues during sheath pull or recovery. Pt will transition to procedural short stay for the remainder of their stay.

## 2024-03-05 ENCOUNTER — Telehealth (HOSPITAL_COMMUNITY): Payer: Self-pay

## 2024-03-05 ENCOUNTER — Encounter (HOSPITAL_COMMUNITY): Payer: Self-pay | Admitting: Internal Medicine

## 2024-03-05 NOTE — Telephone Encounter (Signed)
 Spoke with patient to complete post procedure follow up call.  Patient reports no complications with groin sites.   Instructions reviewed with patient:  Remove large bandage at puncture site after 24 hours. It is normal to have bruising, tenderness, mild swelling, and a pea or marble sized lump/knot at the groin site which can take up to three months to resolve.  Get help right away if you notice sudden swelling at the puncture site.  Check your puncture site every day for signs of infection: fever, redness, swelling, pus drainage, warmth, foul odor or excessive pain. If this occurs, please call the office at [phone], to speak with the nurse. Get help right away if your puncture site is bleeding and the bleeding does not stop after applying firm pressure to the area.  You may continue to have skipped beats during the first several months after your procedure.  You will follow up with the APP on 04/02/24.   Patient verbalized understanding to all instructions provided.

## 2024-03-15 ENCOUNTER — Other Ambulatory Visit: Payer: Self-pay | Admitting: Cardiology

## 2024-03-28 ENCOUNTER — Other Ambulatory Visit: Payer: Self-pay | Admitting: Cardiology

## 2024-03-31 NOTE — Progress Notes (Unsigned)
  Electrophysiology Office Note:   Date:  03/31/2024  ID:  Shane Kerr, DOB [birth year], MRN [id number]  Primary Cardiologist: None Electrophysiologist: Danelle Birmingham, MD   Electrophysiologist:  Danelle Birmingham, MD  {Click to update primary MD,subspecialty MD or APP then REFRESH:1}    History of Present Illness:   Shane Kerr is a 68 y.o. male with h/o PVCs, CAD, and HTN seen today for routine electrophysiology followup.   Since last being seen in our clinic the patient reports doing ***.  he denies chest pain, palpitations, dyspnea, PND, orthopnea, nausea, vomiting, dizziness, syncope, edema, weight gain, or early satiety.   Review of systems complete and found to be negative unless listed in HPI.   EP Information / Studies Reviewed:    EKG is ordered today. Personal review as below.       Arrhythmia/Device History S/p PVC ablation 03/04/2024   Physical Exam:   VS:  There were no vitals taken for this visit.   Wt Readings from Last 3 Encounters:  03/04/24 225 lb (102.1 kg)  12/20/23 230 lb 3.2 oz (104.4 kg)  07/31/23 230 lb (104.3 kg)     GEN: No acute distress NECK: No JVD; No carotid bruits CARDIAC: {EPRHYTHM:28826}, no murmurs, rubs, gallops RESPIRATORY:  Clear to auscultation without rales, wheezing or rhonchi  ABDOMEN: Soft, non-tender, non-distended EXTREMITIES:  {EDEMA LEVEL:28147::No} edema; No deformity   ASSESSMENT AND PLAN:    PVCs S/p ablation 03/04/2024 of PVCs coming from RV septum EKG today ***  HLD Continue statin   {Click here to Review PMH, Prob List, Meds, Allergies, SHx, FHx  :1}   Follow up with {EPMDS:28135::EP Team} {EPFOLLOW UP:28173}  Signed, Ozell Prentice Passey, PA-C

## 2024-04-02 ENCOUNTER — Encounter: Payer: Self-pay | Admitting: Student

## 2024-04-02 ENCOUNTER — Ambulatory Visit: Attending: Student | Admitting: Student

## 2024-04-02 VITALS — BP 128/76 | HR 94 | Ht 71.0 in | Wt 231.1 lb

## 2024-04-02 DIAGNOSIS — I493 Ventricular premature depolarization: Secondary | ICD-10-CM | POA: Insufficient documentation

## 2024-04-02 DIAGNOSIS — I2510 Atherosclerotic heart disease of native coronary artery without angina pectoris: Secondary | ICD-10-CM | POA: Diagnosis present

## 2024-04-02 DIAGNOSIS — E782 Mixed hyperlipidemia: Secondary | ICD-10-CM | POA: Insufficient documentation

## 2024-04-02 NOTE — Patient Instructions (Signed)
 Medication Instructions:  Stop mexiletine *If you need a refill on your cardiac medications before your next appointment, please call your pharmacy*  Lab Work: None ordered If you have labs (blood work) drawn today and your tests are completely normal, you will receive your results only by: MyChart Message (if you have MyChart) OR A paper copy in the mail If you have any lab test that is abnormal or we need to change your treatment, we will call you to review the results.  Follow-Up: At [facility], you and your health needs are our priority.  As part of our continuing mission to provide you with exceptional heart care, our providers are all part of one team.  This team includes your primary Cardiologist (physician) and Advanced Practice Providers or APPs (Physician Assistants and Nurse Practitioners) who all work together to provide you with the care you need, when you need it.  Your next appointment:   3 month(s)  Provider:   Ozell Jodie Passey, PA-C

## 2024-04-07 ENCOUNTER — Telehealth: Payer: Self-pay | Admitting: Student

## 2024-04-07 NOTE — Telephone Encounter (Signed)
 Called patient about his message. Patient complained of ringing in his ears and dizziness, and he would like to stop his metoprolol . Patient had no BP or HR to report. Will send to Jodie Passey PA for advisement.

## 2024-04-07 NOTE — Telephone Encounter (Signed)
 Pt c/o medication issue:   1. Name of Medication:   metoprolol  succinate (TOPROL -XL) 25 MG 24 hr tablet     2. How are you currently taking this medication (dosage and times per day)?   TAKE 1 TABLET (25 MG TOTAL) BY MOUTH DAILY.     3. Are you having a reaction (difficulty breathing--STAT)? No   4. What is your medication issue? Pt states the medication is making him dizzy and would like to go off of said medication. Please advise.

## 2024-04-07 NOTE — Telephone Encounter (Signed)
 Called patient back. Patient stated he has had dizziness since starting the mexitil and he thought it would stop after he stopped the mexitil, but it did not. Patient would like to stop metoprolol , and just start with a clean slat. He would like to see how he does off the medication.

## 2024-04-07 NOTE — Telephone Encounter (Signed)
 Left message for patient to call back

## 2024-04-08 NOTE — Telephone Encounter (Signed)
 Called patient back with Jodie Passey PA's advisement.   Passey Ozell Barter, PA-C to Me   04/08/24  9:28 AM Ok to stop for now.  If he feels he is having more PVCs off both we can place a 7 day monitor to assess burden now s/p ablation.   Thanks!  Patient agreed to plan and will call if he starts having more PVC's off both medications.

## 2024-04-15 NOTE — Telephone Encounter (Signed)
 Spoke with pt and explained that if pt is still having these symptoms after stopping Metoprolol , they are not related to the Metoprolol . Pt stated he went to PCP this morning to address the symptoms and PCP had told him to address with us  again. Pt stated he knows that Aspirin  can cause the ear ringing sometimes and wanted to discuss with our office about that potentially being the cause.   Pt also stated that he had previously been scheduled to see Dr. Verlin as a new pt back in August 2024 but that was canceled and he was sent EP. Pt asked to establish gen cards care again with Dr. Verlin. Explained to pt that I will send this to both providers to make sure the switch is accepted and we will let him know. Pt verbalized understanding of plan and had no further questions at this time.

## 2024-04-15 NOTE — Telephone Encounter (Signed)
 Spoke to patient Shane Arthur PA advice given.Stated he stopped taking Metoprolol  and Mexitil.He has been having dizziness and ringing in both ears since he stopped taking.PCP advised him to see cardiologist.Stated he wants to change care to Dr.McAlhany.Dr.Patwardhan advised ok to change care.Appointment scheduled with Dr.McAlhany 8/20 at 9:40 am.

## 2024-04-15 NOTE — Telephone Encounter (Signed)
 I am happy to see him at my next available appt, but respect his wishes if he would like to see Dr. Verlin.  Thanks MJP

## 2024-04-15 NOTE — Telephone Encounter (Signed)
 Patient stated since he stopped his medication he has been having ringing in his ear and slightly lightheaded/dizziness.  Patient wants a call back to discuss if this is a lingering side effect from the medication and next steps.

## 2024-04-22 ENCOUNTER — Ambulatory Visit: Attending: Cardiology | Admitting: Cardiovascular Disease

## 2024-04-22 VITALS — BP 137/90 | HR 113 | Ht 71.0 in | Wt 225.0 lb

## 2024-04-22 DIAGNOSIS — I2510 Atherosclerotic heart disease of native coronary artery without angina pectoris: Secondary | ICD-10-CM | POA: Insufficient documentation

## 2024-04-22 DIAGNOSIS — I10 Essential (primary) hypertension: Secondary | ICD-10-CM | POA: Diagnosis not present

## 2024-04-22 DIAGNOSIS — E782 Mixed hyperlipidemia: Secondary | ICD-10-CM | POA: Insufficient documentation

## 2024-04-22 NOTE — Progress Notes (Signed)
 Chief Complaint  Patient presents with   Follow-up    CAD   History of Present Illness: 68 yo male with history of CAD, HTN, HLD and PVCs post ablation who is here today to establish care. He had been followed in [facility] by Dr. Elmira. Cardiac cath in May 2020 with severe ostial Diagonal stenosis treated with balloon angioplasty only. Moderate disease in the proximal and mid LAD. Echo in May 2024 with normal LV function, no valve disease. Mild disease in the PDA. PVC ablation in July 2025. He is followed in the EP clinic by Dr. Waddell. He has been off of his statin for several months due to not taking at night but no side effects. He has stopped his mexilitine and metoprolol . He   He is changing his care to me today. The patient denies any chest pain, dyspnea, palpitations, lower extremity edema, orthopnea, PND, dizziness, near syncope or syncope. He has ringing in his ears and thinks it was from the metoprolol  he had been taking.He found an article online that stated metoprolol  can cause ototoxicity. He has been off of his statin, ASA and metoprolol  for months and the ringing started after he stopped all of these medications.   Primary Care Physician: Nanci Senior, MD   Past Medical History:  Diagnosis Date   Coronary artery disease    Depression    HTN (hypertension) 01/14/2019   Hyperlipemia 01/14/2019   NSTEMI (non-ST elevated myocardial infarction) [facility])     Past Surgical History:  Procedure Laterality Date   BACK SURGERY     CARDIAC CATHETERIZATION     CORONARY BALLOON ANGIOPLASTY N/A 01/05/2019   Procedure: CORONARY BALLOON ANGIOPLASTY;  Surgeon: Elmira Newman PARAS, MD;  Location: [facility] INVASIVE CV LAB;  Service: Cardiovascular;  Laterality: N/A;   KNEE ARTHROSCOPY Left    LEFT HEART CATH AND CORONARY ANGIOGRAPHY N/A 01/05/2019   Procedure: LEFT HEART CATH AND CORONARY ANGIOGRAPHY;  Surgeon: Elmira Newman PARAS, MD;  Location: [facility]VAS[facility] CV LAB;  Service:  Cardiovascular;  Laterality: N/A;   LUMBAR LAMINECTOMY/DECOMPRESSION MICRODISCECTOMY Left 07/03/2018   Procedure: Left Lumbar five Sacral one Microdiscectomy;  Surgeon: Unice Pac, MD;  Location[facility];  Service: Neurosurgery;  Laterality: Left;   LUMBAR MICRODISCECTOMY Left 07/03/2018   Left Lumbar five Sacral one Microdiscectomy   PVC ABLATION N/A 03/04/2024   Procedure: PVC ABLATION;  Surgeon: Waddell Danelle ORN, MD;  Location: [facility] INVASIVE CV LAB;  Service: Cardiovascular;  Laterality: N/A;   TONSILLECTOMY  1960s   TRIGGER FINGER RELEASE Right 06/2014   middle finger    Current Outpatient Medications  Medication Sig Dispense Refill   ASPIRIN  LOW DOSE 81 MG EC tablet TAKE 1 TABLET BY MOUTH EVERY DAY 90 tablet 2   atorvastatin  (LIPITOR ) 80 MG tablet Take 80 mg by mouth daily.     nitroGLYCERIN  (NITROSTAT ) 0.4 MG SL tablet Place 1 tablet (0.4 mg total) under the tongue every 5 (five) minutes as needed for chest pain (Up to 3 doses in 15 minutes.). 30 tablet 0   sertraline  (ZOLOFT ) 100 MG tablet Take 1 tablet by mouth daily.     No current facility-administered medications for this visit.    Allergies  Allergen Reactions   Sulfur Nausea And Vomiting   Sulfa Antibiotics Nausea And Vomiting    Social History   Socioeconomic History   Marital status: Married    Spouse name: [name]   Number of children: 1   Years of education: 79  Highest education level: Not on file  Occupational History    Comment: city of    Tobacco Use   Smoking status: Never   Smokeless tobacco: Never  Vaping Use   Vaping status: Never Used  Substance and Sexual Activity   Alcohol use: Yes    Comment: 07/03/2018 might have 1 beer/month; if that   Drug use: Never   Sexual activity: Not Currently  Other Topics Concern   Not on file  Social History Narrative   Lives with wife who he reports is a retired Materials engineer   Social Drivers of Corporate investment banker Strain: Low Risk   (05/12/2021)   Overall Financial Resource Strain (CARDIA)    Difficulty of Paying Living Expenses: Not hard at all  Food Insecurity: No Food Insecurity (05/12/2021)   Hunger Vital Sign    Worried About Running Out of Food in the Last Year: Never true    Ran Out of Food in the Last Year: Never true  Transportation Needs: No Transportation Needs (05/12/2021)   PRAPARE - Administrator, Civil Service (Medical): No    Lack of Transportation (Non-Medical): No  Physical Activity: Inactive (04/07/2019)   Exercise Vital Sign    Days of Exercise per Week: 0 days    Minutes of Exercise per Session: 0 min  Stress: Stress Concern Present (04/07/2019)   [employer] of Occupational Health - Occupational Stress Questionnaire    Feeling of Stress : Rather much  Social Connections: Unknown (05/12/2021)   Social Connection and Isolation Panel    Frequency of Communication with Friends and Family: More than three times a week    Frequency of Social Gatherings with Friends and Family: More than three times a week    Attends Religious Services: Patient declined    Database administrator or Organizations: Patient declined    Attends Banker Meetings: Patient declined    Marital Status: Married  Catering manager Violence: Not At Risk (05/12/2021)   Humiliation, Afraid, Rape, and Kick questionnaire    Fear of Current or Ex-Partner: No    Emotionally Abused: No    Physically Abused: No    Sexually Abused: No    Family History  Problem Relation Age of Onset   Heart disease Mother    Arthritis Mother    Diabetes Father     Review of Systems:  As stated in the HPI and otherwise negative.   BP (!) 137/90   Pulse (!) 113   Ht 5' 11 (1.803 m)   Wt 225 lb (102.1 kg)   SpO2 95%   BMI 31.38 kg/m   Physical Examination: General: Well developed, well nourished, NAD  HEENT: OP clear, mucus membranes moist  SKIN: warm, dry. No rashes. Neuro: No focal deficits  Musculoskeletal:  Muscle strength 5/5 all ext  Psychiatric: Mood and affect normal  Neck: No JVD, no carotid bruits, no thyromegaly, no lymphadenopathy.  Lungs:Clear bilaterally, no wheezes, rhonci, crackles Cardiovascular: Regular rate and rhythm. No murmurs, gallops or rubs. Abdomen:Soft. Bowel sounds present. Non-tender.  Extremities: No lower extremity edema. Pulses are 2 + in the bilateral DP/PT.  EKG:  EKG is not ordered today. The ekg ordered today demonstrates   Recent Labs: 02/24/2024: BUN 15; Creatinine, Ser 1.32; Hemoglobin 16.4; Platelets 239; Potassium 4.8; Sodium 137   Lipid Panel    Component Value Date/Time   CHOL 158 11/14/2022 0833   TRIG 76 11/14/2022 0833   HDL 54 11/14/2022  0833   CHOLHDL 2.9 11/14/2022 0833   CHOLHDL 2.4 01/04/2019 1834   VLDL 6 01/04/2019 1834   LDLCALC 89 11/14/2022 0833     Wt Readings from Last 3 Encounters:  04/22/24 225 lb (102.1 kg)  04/02/24 231 lb 1.6 oz (104.8 kg)  03/04/24 225 lb (102.1 kg)    Assessment and Plan:   1. CAD without angina: No chest pain. Restart ASA 81 mg daily and Lipitor  80 mg daily.   2. HTN: BP is normal on no medical therapy  3. Hyperlipidemia: Resume Lipitor  80 mg daily. Lipids followed in primary care.   Labs/ tests ordered today include:  No orders of the defined types were placed in this encounter.  Disposition:   F/U with me in one year   Signed, Lonni Cash, MD, [facility] 04/22/2024 11:05 AM    [facility] [address] [place], [place], [place]  [address] Phone: [phone]; Fax: [phone]

## 2024-04-22 NOTE — Patient Instructions (Signed)
 Medication Instructions:  No new medications - restart aspirin  81 mg daily and atorvastatin  80 mg daily *If you need a refill on your cardiac medications before your next appointment, please call your pharmacy*  Lab Work: none If you have labs (blood work) drawn today and your tests are completely normal, you will receive your results only by: MyChart Message (if you have MyChart) OR A paper copy in the mail If you have any lab test that is abnormal or we need to change your treatment, we will call you to review the results.  Testing/Procedures: none  Follow-Up: At [facility], you and your health needs are our priority.  As part of our continuing mission to provide you with exceptional heart care, our providers are all part of one team.  This team includes your primary Cardiologist (physician) and Advanced Practice Providers or APPs (Physician Assistants and Nurse Practitioners) who all work together to provide you with the care you need, when you need it.  Your next appointment:   12 month(s)  Provider:   Lonni Cash, MD  Other Instructions

## 2024-04-27 ENCOUNTER — Ambulatory Visit: Admitting: Cardiovascular Disease

## 2024-07-07 ENCOUNTER — Ambulatory Visit: Admitting: Student

## 2024-07-12 NOTE — Progress Notes (Unsigned)
  Electrophysiology Office Note:   Date:  07/12/2024  ID:  Shane Kerr, DOB 10-28-1955, MRN [id number]  Primary Cardiologist: Lonni Cash, MD Electrophysiologist: Danelle Birmingham, MD   Electrophysiologist:  Danelle Birmingham, MD  {Click to update primary MD,subspecialty MD or APP then REFRESH:1}    History of Present Illness:   Shane Kerr is a 69 y.o. male with h/o PVCs, CAD, and HTN seen today for routine electrophysiology followup.   Since last being seen in our clinic the patient reports doing ***.  he denies chest pain, palpitations, dyspnea, PND, orthopnea, nausea, vomiting, dizziness, syncope, edema, weight gain, or early satiety.   Review of systems complete and found to be negative unless listed in HPI.   EP Information / Studies Reviewed:    EKG is ordered today. Personal review as below.       Arrhythmia/Device History S/p PVC ablation 03/04/2024   Physical Exam:   VS:  There were no vitals taken for this visit.   Wt Readings from Last 3 Encounters:  04/22/24 225 lb (102.1 kg)  04/02/24 231 lb 1.6 oz (104.8 kg)  03/04/24 225 lb (102.1 kg)     GEN: No acute distress NECK: No JVD; No carotid bruits CARDIAC: {EPRHYTHM:28826}, no murmurs, rubs, gallops RESPIRATORY:  Clear to auscultation without rales, wheezing or rhonchi  ABDOMEN: Soft, non-tender, non-distended EXTREMITIES:  {EDEMA LEVEL:28147::No} edema; No deformity   ASSESSMENT AND PLAN:    PVCs S/p ablation 03/04/2024 of PVCs coming from RV septum EKG today *** Continue toprol  25 mg daily   HLD Continue statin   CAD No s/s of ischemia.    Not 1c candidate  HTN Stable on current regimen   {Click here to Review PMH, Prob List, Meds, Allergies, SHx, FHx  :1}   Follow up with {EPMDS:28135::EP Team} {EPFOLLOW UP:28173}  Signed, Ozell Prentice Passey, PA-C

## 2024-07-13 ENCOUNTER — Ambulatory Visit: Attending: Student | Admitting: Student

## 2024-07-13 ENCOUNTER — Encounter: Payer: Self-pay | Admitting: Student

## 2024-07-13 VITALS — BP 144/80 | HR 100 | Ht 71.0 in | Wt 226.0 lb

## 2024-07-13 DIAGNOSIS — I2510 Atherosclerotic heart disease of native coronary artery without angina pectoris: Secondary | ICD-10-CM | POA: Diagnosis not present

## 2024-07-13 DIAGNOSIS — I10 Essential (primary) hypertension: Secondary | ICD-10-CM | POA: Diagnosis not present

## 2024-07-13 DIAGNOSIS — E782 Mixed hyperlipidemia: Secondary | ICD-10-CM | POA: Diagnosis present

## 2024-07-13 DIAGNOSIS — I493 Ventricular premature depolarization: Secondary | ICD-10-CM | POA: Diagnosis present

## 2024-07-13 NOTE — Patient Instructions (Signed)
 Medication Instructions:  No medication changes today. *If you need a refill on your cardiac medications before your next appointment, please call your pharmacy*  Lab Work: No labwork ordered today. If you have labs (blood work) drawn today and your tests are completely normal, you will receive your results only by: MyChart Message (if you have MyChart) OR A paper copy in the mail If you have any lab test that is abnormal or we need to change your treatment, we will call you to review the results.  Testing/Procedures: No testing ordered today  Follow-Up: At [facility], you and your health needs are our priority.  As part of our continuing mission to provide you with exceptional heart care, our providers are all part of one team.  This team includes your primary Cardiologist (physician) and Advanced Practice Providers or APPs (Physician Assistants and Nurse Practitioners) who all work together to provide you with the care you need, when you need it.  Your next appointment:   EP as needed.   Gen Cards in 1 year.  We recommend signing up for the patient portal called MyChart.  Sign up information is provided on this After Visit Summary.  MyChart is used to connect with patients for Virtual Visits (Telemedicine).  Patients are able to view lab/test results, encounter notes, upcoming appointments, etc.  Non-urgent messages can be sent to your provider as well.   To learn more about what you can do with MyChart, go to [email or web address].

## 2024-10-07 ENCOUNTER — Telehealth: Payer: Self-pay | Admitting: Cardiovascular Disease

## 2024-10-07 NOTE — Telephone Encounter (Signed)
 Pt requesting order for CT Cardiac Score Test. Please advise.

## 2024-10-07 NOTE — Telephone Encounter (Signed)
 Pt requested medical records over two months ago and is not sure how to f/u. Has not received records yet. Please advise.

## 2024-10-07 NOTE — Telephone Encounter (Signed)
 Patient states he would like to have a calcium  score CT performed. He is aware of $99 OOP cost.  Informed patient we will forward request to Dr. Verlin for review. Once approved we will place order and a scheduler will contact him to schedule appt for calcium  score CT. Patient verbalized understanding and expressed appreciation for call.
